# Patient Record
Sex: Male | Born: 1950 | Race: Black or African American | Hispanic: No | Marital: Married | Smoking: Current every day smoker
Health system: Southern US, Community
[De-identification: ages and names within clinical notes are randomized; demographics above are authoritative.]

## PROBLEM LIST (undated history)

## (undated) DIAGNOSIS — Z9289 Personal history of other medical treatment: Secondary | ICD-10-CM

## (undated) DIAGNOSIS — I639 Cerebral infarction, unspecified: Secondary | ICD-10-CM

## (undated) DIAGNOSIS — J449 Chronic obstructive pulmonary disease, unspecified: Secondary | ICD-10-CM

## (undated) HISTORY — PX: NO PAST SURGERIES: SHX2092

## (undated) HISTORY — DX: Chronic obstructive pulmonary disease, unspecified: J44.9

---

## 2000-04-16 ENCOUNTER — Ambulatory Visit (HOSPITAL_COMMUNITY): Admission: RE | Admit: 2000-04-16 | Discharge: 2000-04-16 | Payer: Self-pay | Admitting: Family Medicine

## 2000-04-16 ENCOUNTER — Encounter: Payer: Self-pay | Admitting: Family Medicine

## 2001-06-04 ENCOUNTER — Inpatient Hospital Stay (HOSPITAL_COMMUNITY): Admission: EM | Admit: 2001-06-04 | Discharge: 2001-06-05 | Payer: Self-pay | Admitting: Emergency Medicine

## 2001-06-04 ENCOUNTER — Encounter: Payer: Self-pay | Admitting: Emergency Medicine

## 2001-06-15 ENCOUNTER — Ambulatory Visit (HOSPITAL_COMMUNITY): Admission: RE | Admit: 2001-06-15 | Discharge: 2001-06-15 | Payer: Self-pay | Admitting: Cardiology

## 2001-06-15 ENCOUNTER — Encounter: Payer: Self-pay | Admitting: Cardiology

## 2001-06-16 ENCOUNTER — Encounter: Admission: RE | Admit: 2001-06-16 | Discharge: 2001-06-16 | Payer: Self-pay | Admitting: Family Medicine

## 2001-11-06 ENCOUNTER — Emergency Department (HOSPITAL_COMMUNITY): Admission: EM | Admit: 2001-11-06 | Discharge: 2001-11-06 | Payer: Self-pay | Admitting: Emergency Medicine

## 2010-08-06 ENCOUNTER — Inpatient Hospital Stay (INDEPENDENT_AMBULATORY_CARE_PROVIDER_SITE_OTHER)
Admission: RE | Admit: 2010-08-06 | Discharge: 2010-08-06 | Disposition: A | Discharge status: Home / Self Care | Payer: Self-pay | Source: Ambulatory Visit | Attending: Family Medicine | Admitting: Family Medicine

## 2010-08-06 DIAGNOSIS — L03019 Cellulitis of unspecified finger: Secondary | ICD-10-CM

## 2010-08-09 LAB — CULTURE, ROUTINE-ABSCESS

## 2012-01-18 ENCOUNTER — Emergency Department (HOSPITAL_COMMUNITY): Payer: PRIVATE HEALTH INSURANCE

## 2012-01-18 ENCOUNTER — Encounter (HOSPITAL_COMMUNITY): Payer: Self-pay | Admitting: Emergency Medicine

## 2012-01-18 ENCOUNTER — Inpatient Hospital Stay (HOSPITAL_COMMUNITY)
Admission: EM | Admit: 2012-01-18 | Discharge: 2012-01-20 | DRG: 065 | Disposition: A | Discharge status: Home / Self Care | Payer: PRIVATE HEALTH INSURANCE | Attending: Internal Medicine | Admitting: Internal Medicine

## 2012-01-18 DIAGNOSIS — F17200 Nicotine dependence, unspecified, uncomplicated: Secondary | ICD-10-CM | POA: Diagnosis present

## 2012-01-18 DIAGNOSIS — Z720 Tobacco use: Secondary | ICD-10-CM

## 2012-01-18 DIAGNOSIS — I6350 Cerebral infarction due to unspecified occlusion or stenosis of unspecified cerebral artery: Principal | ICD-10-CM | POA: Diagnosis present

## 2012-01-18 DIAGNOSIS — E785 Hyperlipidemia, unspecified: Secondary | ICD-10-CM

## 2012-01-18 DIAGNOSIS — G8190 Hemiplegia, unspecified affecting unspecified side: Secondary | ICD-10-CM | POA: Diagnosis present

## 2012-01-18 DIAGNOSIS — E119 Type 2 diabetes mellitus without complications: Secondary | ICD-10-CM | POA: Diagnosis present

## 2012-01-18 DIAGNOSIS — I639 Cerebral infarction, unspecified: Secondary | ICD-10-CM | POA: Diagnosis present

## 2012-01-18 DIAGNOSIS — R279 Unspecified lack of coordination: Secondary | ICD-10-CM | POA: Diagnosis present

## 2012-01-18 LAB — CBC
MCH: 30.3 pg (ref 26.0–34.0)
MCV: 87.7 fL (ref 78.0–100.0)
Platelets: 221 10*3/uL (ref 150–400)
RBC: 4.22 MIL/uL (ref 4.22–5.81)
RDW: 13.7 % (ref 11.5–15.5)
WBC: 8.1 10*3/uL (ref 4.0–10.5)

## 2012-01-18 LAB — POCT I-STAT, CHEM 8
BUN: 16 mg/dL (ref 6–23)
Chloride: 107 mEq/L (ref 96–112)
Creatinine, Ser: 1.2 mg/dL (ref 0.50–1.35)
Potassium: 3.6 mEq/L (ref 3.5–5.1)
Sodium: 144 mEq/L (ref 135–145)
TCO2: 24 mmol/L (ref 0–100)

## 2012-01-18 LAB — DIFFERENTIAL
Basophils Absolute: 0 10*3/uL (ref 0.0–0.1)
Eosinophils Absolute: 0.3 10*3/uL (ref 0.0–0.7)
Eosinophils Relative: 4 % (ref 0–5)
Lymphs Abs: 2.9 10*3/uL (ref 0.7–4.0)
Neutrophils Relative %: 51 % (ref 43–77)

## 2012-01-18 LAB — GLUCOSE, CAPILLARY: Glucose-Capillary: 91 mg/dL (ref 70–99)

## 2012-01-18 LAB — COMPREHENSIVE METABOLIC PANEL
ALT: 11 U/L (ref 0–53)
AST: 17 U/L (ref 0–37)
Albumin: 4 g/dL (ref 3.5–5.2)
Alkaline Phosphatase: 72 U/L (ref 39–117)
Calcium: 9.9 mg/dL (ref 8.4–10.5)
Potassium: 3.7 mEq/L (ref 3.5–5.1)
Sodium: 139 mEq/L (ref 135–145)
Total Protein: 6.8 g/dL (ref 6.0–8.3)

## 2012-01-18 LAB — PROTIME-INR
INR: 0.92 (ref 0.00–1.49)
Prothrombin Time: 12.3 seconds (ref 11.6–15.2)

## 2012-01-18 NOTE — ED Notes (Signed)
Pt noted onset of inability to keep balance, like room was spinning, onset this a.m.  Also complains of "band around by head" for a while. Defines while as a couple months. Has not seen anyone about tension in head, just came today d/t inability to keep balance.

## 2012-01-18 NOTE — ED Provider Notes (Signed)
History     CSN: [id number]  Arrival date & time 01/18/12  1619   First MD Initiated Contact with Patient 01/18/12 1639      Chief Complaint  Patient presents with  . Dizziness    (Consider location/radiation/quality/duration/timing/severity/associated sxs/prior treatment) HPI  61 y.o. male in no acute distress complaining of difficulty ambulating with ataxia for the last 24 hours. Patient also reports a sensation of a feeling of a band around his head for several months.This is not described as a headache or painful. Distal sensation is not exacerbated by Valsalva.  Denies vertiginous sensation. Denies fever, chest pain, shortness of breath, dysphagia, dysarthria, focal weakness.  RF DM, 1/2 PPD tobacco PCP Harwani   Past Medical History  Diagnosis Date  . Diabetes mellitus     History reviewed. No pertinent past surgical history.  No family history on file.  History  Substance Use Topics  . Smoking status: Current Every Day Smoker -- 0.2 packs/day    Types: Cigarettes  . Smokeless tobacco: Never Used  . Alcohol Use: No      Review of Systems  Constitutional: Negative for fever.  Respiratory: Negative for shortness of breath.   Cardiovascular: Negative for chest pain.  Gastrointestinal: Negative for nausea, vomiting, abdominal pain and diarrhea.  Neurological: Negative for tremors, syncope, speech difficulty, weakness, numbness and headaches.       Ataxia  All other systems reviewed and are negative.    Allergies  Review of patient's allergies indicates not on file.  Home Medications  No current outpatient prescriptions on file.  BP 145/72  Pulse 81  Temp 98.8 F (37.1 C) (Oral)  Resp 18  Ht 6\' 3"  (1.905 m)  Wt 185 lb (83.915 kg)  BMI 23.12 kg/m2  SpO2 99%  Physical Exam  Nursing note and vitals reviewed. Constitutional: He is oriented to person, place, and time. He appears well-developed and well-nourished. No distress.  HENT:  Head:  Normocephalic and atraumatic.  Mouth/Throat: Oropharynx is clear and moist.  Eyes: Conjunctivae normal and EOM are normal. Pupils are equal, round, and reactive to light.  Neck: Normal range of motion.       No carotid bruits appreciated.  Cardiovascular: Normal rate, regular rhythm and intact distal pulses.   Pulmonary/Chest: Effort normal and breath sounds normal. No stridor. No respiratory distress. He has no wheezes. He has no rales. He exhibits no tenderness.  Abdominal: Soft. Bowel sounds are normal. He exhibits no distension and no mass. There is no tenderness. There is no rebound and no guarding.  Musculoskeletal: Normal range of motion.  Neurological: He is alert and oriented to person, place, and time.       Cranial nerves III through XII intact, strength 5 out of 5x4 extremities, negative pronator drift, finger to nose and heel-to-shin coordinated, sensation intact to pinprick and light touch, gait is coordinated and Romberg is negative.   Psychiatric: He has a normal mood and affect.    ED Course  Procedures (including critical care time)  Labs Reviewed  CBC - Abnormal; Notable for the following:    Hemoglobin 12.8 (*)     HCT 37.0 (*)     All other components within normal limits  COMPREHENSIVE METABOLIC PANEL - Abnormal; Notable for the following:    GFR calc non Af Amer 72 (*)     GFR calc Af Amer 83 (*)     All other components within normal limits  GLUCOSE, CAPILLARY  PROTIME-INR  APTT  DIFFERENTIAL  TROPONIN I  GLUCOSE, CAPILLARY  POCT I-STAT, CHEM 8   Ct Head Wo Contrast  01/18/2012  *RADIOLOGY REPORT*  Clinical Data: Sudden onset of imbalance this morning.  Band-like sensation around the head for several months.  CT HEAD WITHOUT CONTRAST  Technique:  Contiguous axial images were obtained from the base of the skull through the vertex without contrast.  Comparison: Brain MR report dated 04/16/2000.  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  The previous  brain MR report described old right cerebellar lacunar infarcts. Small, old left cerebellar infarct or focally prominent subarachnoid space.  Normal size and position of the ventricles. Minimal patchy white matter low density in both cerebral hemispheres.  No intracranial hemorrhage, mass lesion or CT evidence of acute infarction. Bilateral internal carotid artery atheromatous calcification.  Mild sphenoid sinus mucosal thickening.  Unremarkable bones.  Multiple small areas of calcific density in the skin and subcutaneous fat of the forehead bilaterally.  IMPRESSION:  1.  No acute abnormality. 2.  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 3.  Minimal chronic small vessel white matter ischemic changes in both cerebral hemispheres.   Original Report Authenticated By: Darrol Angel, M.D.     Date: 01/18/2012  Rate: 73  Rhythm: normal sinus rhythm  QRS Axis: normal  Intervals: normal  ST/T Wave abnormalities: normal  Conduction Disutrbances:none  Narrative Interpretation: LVH  Old EKG Reviewed: unchanged  1. CVA (cerebral infarction)       MDM  Neurological exam is normal with coordinated gait and no cerebellar signs. However patient reports subjective ataxia.  Patient is at relatively high risk for stroke based on PMH of diabetes and hyperlipidemia. Possible the patient has undiagnosed hypertension as BP is mildly elevated and ECG shows LVH.   CT show remote right infarct and possible left cerebellar infarct. MRI ordered.  MRI delayed because tech must be called in.  Signed out to PA Eagle at shift change.          Wynetta Emery, PA-C 01/19/12 [phone]

## 2012-01-18 NOTE — ED Notes (Signed)
Waiting for MRI tech to come in.  Patient notified of delay.

## 2012-01-18 NOTE — ED Notes (Signed)
Pt returned from CT °

## 2012-01-18 NOTE — ED Notes (Signed)
Patient transported to CT 

## 2012-01-18 NOTE — ED Notes (Signed)
Pt returned from MRI °

## 2012-01-19 ENCOUNTER — Inpatient Hospital Stay (HOSPITAL_COMMUNITY): Payer: PRIVATE HEALTH INSURANCE

## 2012-01-19 DIAGNOSIS — R6889 Other general symptoms and signs: Secondary | ICD-10-CM

## 2012-01-19 DIAGNOSIS — I639 Cerebral infarction, unspecified: Secondary | ICD-10-CM | POA: Diagnosis present

## 2012-01-19 DIAGNOSIS — I6350 Cerebral infarction due to unspecified occlusion or stenosis of unspecified cerebral artery: Principal | ICD-10-CM

## 2012-01-19 MED ORDER — ATORVASTATIN CALCIUM 40 MG PO TABS
40.0000 mg | ORAL_TABLET | Freq: Every day | ORAL | Status: DC
  Administered 2012-01-20 (×2): 40 mg via ORAL
  Filled 2012-01-19 (×3): qty 1

## 2012-01-19 MED ORDER — INSULIN ASPART 100 UNIT/ML ~~LOC~~ SOLN: 0.0000 [IU] | Freq: Three times a day (TID) | SUBCUTANEOUS | Status: DC

## 2012-01-19 MED ORDER — SENNOSIDES-DOCUSATE SODIUM 8.6-50 MG PO TABS: 1.0000 | ORAL_TABLET | Freq: Every evening | ORAL | Status: DC | PRN

## 2012-01-19 MED ORDER — METFORMIN HCL 500 MG PO TABS
500.0000 mg | ORAL_TABLET | Freq: Two times a day (BID) | ORAL | Status: DC
  Administered 2012-01-19: 500 mg via ORAL
  Filled 2012-01-19 (×3): qty 1

## 2012-01-19 MED ORDER — GADOBENATE DIMEGLUMINE 529 MG/ML IV SOLN
18.0000 mL | Freq: Once | INTRAVENOUS | Status: AC | PRN
  Administered 2012-01-19: 18 mL via INTRAVENOUS

## 2012-01-19 MED ORDER — ASPIRIN 325 MG PO TABS
650.0000 mg | ORAL_TABLET | Freq: Once | ORAL | Status: AC
  Administered 2012-01-19: 650 mg via ORAL
  Filled 2012-01-19: qty 2

## 2012-01-19 MED ORDER — ENOXAPARIN SODIUM 40 MG/0.4ML ~~LOC~~ SOLN
40.0000 mg | Freq: Every day | SUBCUTANEOUS | Status: DC
  Administered 2012-01-19: 40 mg via SUBCUTANEOUS
  Filled 2012-01-19 (×2): qty 0.4

## 2012-01-19 MED ORDER — ASPIRIN 81 MG PO CHEW
CHEWABLE_TABLET | ORAL | Status: AC
  Administered 2012-01-19: 648 mg
  Filled 2012-01-19: qty 8

## 2012-01-19 MED ORDER — INSULIN ASPART 100 UNIT/ML ~~LOC~~ SOLN: 0.0000 [IU] | Freq: Every day | SUBCUTANEOUS | Status: DC

## 2012-01-19 MED ORDER — ENOXAPARIN SODIUM 40 MG/0.4ML ~~LOC~~ SOLN
40.0000 mg | SUBCUTANEOUS | Status: DC
  Filled 2012-01-19 (×2): qty 0.4

## 2012-01-19 MED ORDER — SODIUM CHLORIDE 0.9 % IV SOLN
INTRAVENOUS | Status: AC
  Administered 2012-01-19: 13:00:00 via INTRAVENOUS

## 2012-01-19 NOTE — Consult Note (Signed)
[facility] CONSULT NOTE     Reason for Consult: Acute right hemisphere watershed stroke.  CC: Left sided weakness and imbalance.    HPI:    Elijah Sandoval is an 61 y.o. male who presented to the [facility] ED with acute left sided weakness and gait instability. He also experienced a sensation of room-spinning, per ED, but on my interview denies this. The patient states that he is on "pills for diabetes" and takes ASA sporadically.   CT at [facility] revealed an old right cerebellar hemisphere infarct and possible old left cerebellar hemisphere infarct. Minimal chronic small vessel white matter ischemic changes in both cerebral hemispheres were noted. A cystic dilatation of the anterior body of the left lateral ventricle was also present.   An MRI brain was subsequently obtained, revealing an acute infarction in a watershed distribution within the right posterior frontal and parietal region. Mild swelling but no hemorrhage or mass effect/shift was noted. 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   EKG negative for atrial fibrillation or flutter.   Past Medical History  Diagnosis Date  . Diabetes mellitus     History reviewed. No pertinent past surgical history.  No family history on file.  Social History:  reports that he has been smoking Cigarettes.  He has been smoking about .25 packs per day. He has never used smokeless tobacco. He reports that he does not drink alcohol or use illicit drugs.  No Known Allergies  Medications:    Home meds: "pills for diabetes" and occasional ASA.    Review of Systems - Denies fever, cough, chest pain, limb pain, abdominal pain, confusion or visual disturbance.   Blood pressure 143/71, pulse 76, temperature 98.8 F (37.1 C), temperature source Oral, resp. rate 17,  height 6\' 3"  (1.905 m), weight 83.915 kg (185 lb), SpO2 99.00%.   Neurologic Examination:   Mental Status: Alert and fully oriented. Pleasant and cooperative. Mild halting quality to speech. No dysarthria. Comprehension, repetition and naming intact.  Cranial Nerves: II- PERRL, visual fields intact.  II/IV/VI- EOMI without nystagmus.  V - intact to temperature bilaterally.  VII- Symmetric without droop or delayed motor response.  VIII-Intact to conversation.  IX/X-No hypophonia or hoarseness.  XI-Shoulder shrug 5/5 bilaterally.  XII-Tongue midline.  Motor: 4+/5 left deltoid, biceps, triceps and grip. 4/5 left hip flexion and knee extension. Otherwise 5/5 throughout. Tone and bulk are normal.  Sensory: Intact to FT and temperature bilaterally in upper and lower extremities, without extinction.  DTR's: Normoactive x 4. Toes downgoing.  Cerebellar: Subtle slowing on the left with FNF.  Gait: Able to stand with own power, stance normal, negative Romberg. Favors right leg when marching in place, with decreased excursion of left leg.   No results found for this basename: cbc, bmp, coags, chol, tri, ldl, hga1c    Results for orders placed during the hospital encounter of 01/18/12 (from the past 48 hour(s))  GLUCOSE, CAPILLARY     Status: Normal   Collection Time   01/18/12  4:28 PM      Component Value Range Comment   Glucose-Capillary 91  70 - 99 mg/dL   GLUCOSE, CAPILLARY     Status: Normal   Collection Time   01/18/12  5:12 PM      Component Value Range Comment   Glucose-Capillary 88  70 - 99 mg/dL    Comment 1 Notify RN     PROTIME-INR     Status: Normal   Collection Time   01/18/12  5:22 PM      Component Value Range Comment   Prothrombin Time 12.3  11.6 - 15.2 seconds    INR 0.92  0.00 - 1.49   APTT     Status: Normal   Collection Time   01/18/12  5:22 PM      Component Value Range Comment   aPTT 27  24 - 37 seconds   CBC     Status: Abnormal   Collection Time   01/18/12   5:22 PM      Component Value Range Comment   WBC 8.1  4.0 - 10.5 K/uL    RBC 4.22  4.22 - 5.81 MIL/uL    Hemoglobin 12.8 (*) 13.0 - 17.0 g/dL    HCT 16.1 (*) 09.6 - 52.0 %    MCV 87.7  78.0 - 100.0 fL    MCH 30.3  26.0 - 34.0 pg    MCHC 34.6  30.0 - 36.0 g/dL    RDW 04.5  40.9 - 81.1 %    Platelets 221  150 - 400 K/uL   DIFFERENTIAL     Status: Normal   Collection Time   01/18/12  5:22 PM      Component Value Range Comment   Neutrophils Relative 51  43 - 77 %    Neutro Abs 4.1  1.7 - 7.7 K/uL    Lymphocytes Relative 36  12 - 46 %    Lymphs Abs 2.9  0.7 - 4.0 K/uL    Monocytes Relative 9  3 - 12 %    Monocytes Absolute 0.7  0.1 - 1.0 K/uL    Eosinophils Relative 4  0 - 5 %    Eosinophils Absolute 0.3  0.0 - 0.7 K/uL    Basophils Relative 0  0 - 1 %    Basophils Absolute 0.0  0.0 - 0.1 K/uL   COMPREHENSIVE METABOLIC PANEL     Status: Abnormal   Collection Time   01/18/12  5:22 PM      Component Value Range Comment   Sodium 139  135 - 145 mEq/L    Potassium 3.7  3.5 - 5.1 mEq/L    Chloride 103  96 - 112 mEq/L    CO2 26  19 - 32 mEq/L    Glucose, Bld 77  70 - 99 mg/dL    BUN 15  6 - 23 mg/dL    Creatinine, Ser 9.14  0.50 - 1.35 mg/dL    Calcium 9.9  8.4 - 78.2 mg/dL    Total Protein 6.8  6.0 - 8.3 g/dL    Albumin 4.0  3.5 - 5.2 g/dL    AST 17  0 - 37 U/L    ALT 11  0 - 53 U/L    Alkaline Phosphatase 72  39 - 117 U/L    Total Bilirubin 0.3  0.3 - 1.2 mg/dL    GFR calc non Af Amer 72 (*) >90 mL/min    GFR calc Af Amer 83 (*) >90 mL/min   TROPONIN I     Status: Normal   Collection Time   01/18/12  5:23 PM      Component Value Range Comment   Troponin I <0.30  <  0.30 ng/mL   POCT I-STAT, CHEM 8     Status: Normal   Collection Time   01/18/12  6:02 PM      Component Value Range Comment   Sodium 144  135 - 145 mEq/L    Potassium 3.6  3.5 - 5.1 mEq/L    Chloride 107  96 - 112 mEq/L    BUN 16  6 - 23 mg/dL    Creatinine, Ser 1.61  0.50 - 1.35 mg/dL    Glucose, Bld 76  70 -  99 mg/dL    Calcium, Ion 0.96  0.45 - 1.30 mmol/L    TCO2 24  0 - 100 mmol/L    Hemoglobin 13.6  13.0 - 17.0 g/dL    HCT 40.9  81.1 - 91.4 %     Ct Head Wo Contrast  01/18/2012  *RADIOLOGY REPORT*  Clinical Data: Sudden onset of imbalance this morning.  Band-like sensation around the head for several months.  CT HEAD WITHOUT CONTRAST  Technique:  Contiguous axial images were obtained from the base of the skull through the vertex without contrast.  Comparison: Brain MR report dated 04/16/2000.  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  The previous brain MR report described old right cerebellar lacunar infarcts. Small, old left cerebellar infarct or focally prominent subarachnoid space.  Normal size and position of the ventricles. Minimal patchy white matter low density in both cerebral hemispheres.  No intracranial hemorrhage, mass lesion or CT evidence of acute infarction. Bilateral internal carotid artery atheromatous calcification.  Mild sphenoid sinus mucosal thickening.  Unremarkable bones.  Multiple small areas of calcific density in the skin and subcutaneous fat of the forehead bilaterally.  IMPRESSION:  1.  No acute abnormality. 2.  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 3.  Minimal chronic small vessel white matter ischemic changes in both cerebral hemispheres.   Original Report Authenticated By: Darrol Angel, M.D.    Mr Brain Wo Contrast  01/18/2012  *RADIOLOGY REPORT*  Clinical Data: Dizziness.  Headache.  Pressure.  MRI HEAD WITHOUT CONTRAST  Technique:  Multiplanar, multiecho pulse sequences of the brain and surrounding structures were obtained according to standard protocol without intravenous contrast.  Comparison: Head CT same day  Findings: Diffusion imaging shows acute infarction in a watershed territory affecting the right hemisphere in the posterior frontal to parietal region.  The areas of infarction show mild swelling  but no hemorrhage or mass effect/shift.  There are old infarctions within the cerebellum, right more than left.  No focal brain stem insult.  The cerebral hemispheres otherwise show an old infarction affecting the corpus callosum and pericallosal white matter to the left of midline.  There are some chronic small vessel changes in the hemispheric deep white matter. No mass lesion, hemorrhage or obstructive hydrocephalus.  No pituitary mass.  Sinuses, middle ears and mastoids are clear except for mucosal thickening affecting the sphenoid sinus.  IMPRESSION: Acute infarction in a watershed distribution of the right posterior frontal and parietal region.  Mild swelling but no hemorrhage or mass effect/shift.  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   Original Report Authenticated By: Thomasenia Sales, M.D.      Assessment/Plan:   Assessment: 1. Acute right ACA/MCA watershed infarction. The appearance is most consistent with decreased pressure/flow via the right ICA, possibly due to atherosclerotic carotid stenosis or partial thrombotic/embolic occlusion. Cardiac source is on the DDx.  2. Small chronic cerebellar infarctions.  3. Diabetes mellitus.    Recommendations:  1. Load with 650 mg ASA PO after bedside swallow evaluation.  2. MRA of head and neck, TTE, carotid ultrasound.  3. Permissive HTN x 24 hours.  4. Start atorvastatin 40 mg po qd. Obtain CK level and LFTs.  5. FSBS qac and hs with sliding scale insulin. 6.  PT/OT/Speech.   Electronically signed: Dr. Caryl Pina   01/19/2012, 5:41 AM

## 2012-01-19 NOTE — ED Provider Notes (Signed)
Patient with new onset with difficulty ambulating which began yesterday.  No acute abnormalities here on neuro exam.  Patient with infarct on mri but no code stroke due to low stroke score and timing of presentation 24 hours.   Hilario Quarry, MD 01/19/12 [phone]

## 2012-01-19 NOTE — Progress Notes (Signed)
SLP Cancellation Note  Patient Details Name: Michial Disney MRN: [id number] DOB: 10/26/1950   ST received order for SLE and will be addressed on 01/20/12.        Moreen Fowler MS, CCC-SLP [phone]  [facility] 01/19/2012, 4:36 PM

## 2012-01-19 NOTE — Progress Notes (Signed)
Stroke Team Progress Note  HISTORY Elijah Sandoval is an 61 y.o. male who presented to the [facility] ED with acute left sided weakness and gait instability. He also experienced a sensation of room-spinning, per ED, but on my interview denies this. The patient states that he is on "pills for diabetes" and takes ASA sporadically.  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 Minimal chronic small vessel white matter ischemic changes in both cerebral hemispheres were noted. A cystic dilatation of the anterior body of the left lateral ventricle was also present.  An MRI brain was subsequently obtained, revealing an acute infarction in a watershed distribution within the right posterior frontal and parietal region. Mild swelling but no hemorrhage or mass effect/shift was noted. 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  EKG negative for atrial fibrillation or flutter.    SUBJECTIVE His wife and family is at the bedside. Overall he feels his condition is unchanged. The patient reports dizziness, some headache and left leg weakness.  OBJECTIVE Most recent Vital Signs: Temp: 98 F (36.7 C) (10/06 0820) Temp src: Oral (10/06 0820) BP: 135/68 mmHg (10/06 0930) Pulse Rate: 92  (10/06 0900) Respiratory Rate: 17 O2 Saturation: 100%  CBG (last 3)  Basename 01/18/12 1712 01/18/12 1628  GLUCAP 88 91   Intake/Output from previous day:    IV Fluid Intake:    Medications    . sodium chloride   Intravenous STAT  . aspirin      . aspirin  650 mg Oral Once  . atorvastatin  40 mg Oral q1800  . insulin aspart  0-15 Units Subcutaneous TID WC  . insulin aspart  0-5 Units Subcutaneous QHS  . DISCONTD: metFORMIN  500 mg Oral BID WC  PRN senna-docusate  Diet:  Carb Control thin  liquids Activity:  Up with assistance DVT Prophylaxis:  lovenox  Significant Diagnostic Studies: CBC    Component Value Date/Time   WBC 8.1 01/18/2012 1722   RBC 4.22 01/18/2012 1722   HGB 13.6 01/18/2012 1802   HCT 40.0 01/18/2012 1802   PLT 221 01/18/2012 1722   MCV 87.7 01/18/2012 1722   MCH 30.3 01/18/2012 1722   MCHC 34.6 01/18/2012 1722   RDW 13.7 01/18/2012 1722   LYMPHSABS 2.9 01/18/2012 1722   MONOABS 0.7 01/18/2012 1722   EOSABS 0.3 01/18/2012 1722   BASOSABS 0.0 01/18/2012 1722   CMP    Component Value Date/Time   NA 144 01/18/2012 1802   K 3.6 01/18/2012 1802   CL 107 01/18/2012 1802   CO2 26 01/18/2012 1722   GLUCOSE 76 01/18/2012 1802   BUN 16 01/18/2012 1802   CREATININE 1.20 01/18/2012 1802   CALCIUM 9.9 01/18/2012 1722   PROT 6.8 01/18/2012 1722   ALBUMIN 4.0 01/18/2012 1722   AST 17 01/18/2012 1722   ALT 11 01/18/2012 1722   ALKPHOS 72 01/18/2012 1722   BILITOT 0.3 01/18/2012 1722   GFRNONAA 72* 01/18/2012 1722   GFRAA 83* 01/18/2012 1722   COAGS Lab Results  Component Value Date   INR 0.92 01/18/2012   Lipid Panel No results found for this basename: chol, trig, hdl, cholhdl, vldl, ldlcalc   HgbA1C  No results found for this basename: HGBA1C   Urine Drug Screen  No results found for this basename: labopia,  cocainscrnur, labbenz, amphetmu, thcu, labbarb    Alcohol Level No results found for this basename: eth     Results for orders placed during the hospital encounter of 01/18/12 (from the past 24 hour(s))  GLUCOSE, CAPILLARY     Status: Normal   Collection Time   01/18/12  4:28 PM      Component Value Range   Glucose-Capillary 91  70 - 99 mg/dL  GLUCOSE, CAPILLARY     Status: Normal   Collection Time   01/18/12  5:12 PM      Component Value Range   Glucose-Capillary 88  70 - 99 mg/dL   Comment 1 Notify RN    PROTIME-INR     Status: Normal   Collection Time   01/18/12  5:22 PM      Component Value Range   Prothrombin Time 12.3  11.6 - 15.2 seconds   INR  0.92  0.00 - 1.49  APTT     Status: Normal   Collection Time   01/18/12  5:22 PM      Component Value Range   aPTT 27  24 - 37 seconds  CBC     Status: Abnormal   Collection Time   01/18/12  5:22 PM      Component Value Range   WBC 8.1  4.0 - 10.5 K/uL   RBC 4.22  4.22 - 5.81 MIL/uL   Hemoglobin 12.8 (*) 13.0 - 17.0 g/dL   HCT 45.4 (*) 09.8 - 11.9 %   MCV 87.7  78.0 - 100.0 fL   MCH 30.3  26.0 - 34.0 pg   MCHC 34.6  30.0 - 36.0 g/dL   RDW 14.7  82.9 - 56.2 %   Platelets 221  150 - 400 K/uL  DIFFERENTIAL     Status: Normal   Collection Time   01/18/12  5:22 PM      Component Value Range   Neutrophils Relative 51  43 - 77 %   Neutro Abs 4.1  1.7 - 7.7 K/uL   Lymphocytes Relative 36  12 - 46 %   Lymphs Abs 2.9  0.7 - 4.0 K/uL   Monocytes Relative 9  3 - 12 %   Monocytes Absolute 0.7  0.1 - 1.0 K/uL   Eosinophils Relative 4  0 - 5 %   Eosinophils Absolute 0.3  0.0 - 0.7 K/uL   Basophils Relative 0  0 - 1 %   Basophils Absolute 0.0  0.0 - 0.1 K/uL  COMPREHENSIVE METABOLIC PANEL     Status: Abnormal   Collection Time   01/18/12  5:22 PM      Component Value Range   Sodium 139  135 - 145 mEq/L   Potassium 3.7  3.5 - 5.1 mEq/L   Chloride 103  96 - 112 mEq/L   CO2 26  19 - 32 mEq/L   Glucose, Bld 77  70 - 99 mg/dL   BUN 15  6 - 23 mg/dL   Creatinine, Ser 1.30  0.50 - 1.35 mg/dL   Calcium 9.9  8.4 - 86.5 mg/dL   Total Protein 6.8  6.0 - 8.3 g/dL   Albumin 4.0  3.5 - 5.2 g/dL   AST 17  0 - 37 U/L   ALT 11  0 - 53 U/L   Alkaline Phosphatase 72  39 - 117 U/L   Total Bilirubin 0.3  0.3 - 1.2 mg/dL   GFR calc non Af Amer 72 (*) >90 mL/min  GFR calc Af Amer 83 (*) >90 mL/min  TROPONIN I     Status: Normal   Collection Time   01/18/12  5:23 PM      Component Value Range   Troponin I <0.30  <0.30 ng/mL  POCT I-STAT, CHEM 8     Status: Normal   Collection Time   01/18/12  6:02 PM      Component Value Range   Sodium 144  135 - 145 mEq/L   Potassium 3.6  3.5 - 5.1 mEq/L    Chloride 107  96 - 112 mEq/L   BUN 16  6 - 23 mg/dL   Creatinine, Ser 1.61  0.50 - 1.35 mg/dL   Glucose, Bld 76  70 - 99 mg/dL   Calcium, Ion 0.96  0.45 - 1.30 mmol/L   TCO2 24  0 - 100 mmol/L   Hemoglobin 13.6  13.0 - 17.0 g/dL   HCT 40.9  81.1 - 91.4 %    Ct Head Wo Contrast  01/18/2012  *RADIOLOGY REPORT*  Clinical Data: Sudden onset of imbalance this morning.  Band-like sensation around the head for several months.  CT HEAD WITHOUT CONTRAST  Technique:  Contiguous axial images were obtained from the base of the skull through the vertex without contrast.  Comparison: Brain MR report dated 04/16/2000.  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  The previous brain MR report described old right cerebellar lacunar infarcts. Small, old left cerebellar infarct or focally prominent subarachnoid space.  Normal size and position of the ventricles. Minimal patchy white matter low density in both cerebral hemispheres.  No intracranial hemorrhage, mass lesion or CT evidence of acute infarction. Bilateral internal carotid artery atheromatous calcification.  Mild sphenoid sinus mucosal thickening.  Unremarkable bones.  Multiple small areas of calcific density in the skin and subcutaneous fat of the forehead bilaterally.  IMPRESSION:  1.  No acute abnormality. 2.  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 3.  Minimal chronic small vessel white matter ischemic changes in both cerebral hemispheres.   Original Report Authenticated By: Darrol Angel, M.D.    Mr Brain Wo Contrast  01/18/2012  *RADIOLOGY REPORT*  Clinical Data: Dizziness.  Headache.  Pressure.  MRI HEAD WITHOUT CONTRAST  Technique:  Multiplanar, multiecho pulse sequences of the brain and surrounding structures were obtained according to standard protocol without intravenous contrast.  Comparison: Head CT same day  Findings: Diffusion imaging shows acute infarction in a watershed territory  affecting the right hemisphere in the posterior frontal to parietal region.  The areas of infarction show mild swelling but no hemorrhage or mass effect/shift.  There are old infarctions within the cerebellum, right more than left.  No focal brain stem insult.  The cerebral hemispheres otherwise show an old infarction affecting the corpus callosum and pericallosal white matter to the left of midline.  There are some chronic small vessel changes in the hemispheric deep white matter. No mass lesion, hemorrhage or obstructive hydrocephalus.  No pituitary mass.  Sinuses, middle ears and mastoids are clear except for mucosal thickening affecting the sphenoid sinus.  IMPRESSION: Acute infarction in a watershed distribution of the right posterior frontal and parietal region.  Mild swelling but no hemorrhage or mass effect/shift.  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   Original Report Authenticated By: Thomasenia Sales, M.D.     CT of the brain   IMPRESSION:  1. No acute  abnormality.  2. 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  3. Minimal chronic small vessel white matter ischemic changes in  both cerebral hemispheres.   CT angio  Not ordered  MRI of the brain    IMPRESSION:  Acute infarction in a watershed distribution of the right posterior  frontal and parietal region. Mild swelling but no hemorrhage or  mass effect/shift.  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 Old infarction of the corpus callosum  and pericallosal white matter.  MRA of the brain  Pending  2D Echocardiogram  Pending  Carotid Doppler  Pending  CXR  Not ordered  EKG   SINUS RHYTHM ~ normal P axis, V-rate 50- 99  Physical Exam   The patient is alert and cooperative.  Neurologic exam reveals full extraocular movements, speech is normal. Visual fields are full.  Motor testing reveals good strength of all four  extremities, with the exception that the left leg is slightly weak with elevation.  The patient has good finger-nose-finger and heel-to-shin bilaterally. Gait was not tested.  Deep tendon reflexes are symmetric and normal. Toes are down going bilaterally.    ASSESSMENT Elijah Sandoval is a 61 y.o. male with a right brain stroke. Not on antiplatelet agents prior to admit.   Stroke risk factors:  diabetes mellitus  Hospital day # 1  TREATMENT/PLAN Continue aspirin 325 mg orally every day for secondary stroke prevention.   [name] is a 60 year old gentleman with a history of diabetes on metformin. The patient comes in with a two-day history of some issues with left leg weakness, and some reports of dizziness and headache. The patient was not on antiplatelet agents prior to admission. MRI the brain is shown evidence of a right brain stroke, possibly watershed infarct. The patient will be coming in for a further stroke workup.  -MRI angiogram of the head -MRI angiogram of the neck -2-D echocardiogram -Carotid Doppler study -Physical and occupational therapy evaluation -Aspirin therapy -Follow patient's clinical course -Urine drug screen -Fasting lipid profile   Lesly Dukes

## 2012-01-19 NOTE — Progress Notes (Signed)
PT Cancellation Note  Patient Details Name: Elijah Sandoval MRN: [id number] DOB: [birth year]   Cancelled Treatment:    Reason Eval/Treat Not Completed: Patient at procedure or test/unavailable (pt at MRI)   Milana Kidney 01/19/2012, 5:28 PM  01/19/2012 Milana Kidney DPT PAGER: [phone] OFFICE: [phone]

## 2012-01-19 NOTE — ED Provider Notes (Signed)
  Physical Exam  BP 127/65  Pulse 68  Temp 97.9 F (36.6 C) (Oral)  Resp 19  Ht 6\' 3"  (1.905 m)  Wt 185 lb (83.915 kg)  BMI 23.12 kg/m2  SpO2 98%  Physical Exam Reviewed results of MRI, which indicates patient has had an acute infarct.  I have contacted [facility] neurologist, who will evaluate the patient ED Course  Procedures  MDM Acute infarct in a watershed distribution of the right posterior frontal and parietal regions, with mild swelling.  No hemorrhage, mass effect, or shift  Hospital neurologist here, evaluating patient   Arman Filter, NP 01/19/12 0602

## 2012-01-19 NOTE — H&P (Signed)
[facility] History and Physical  Elijah Sandoval WGN:[id number] DOB: 29-Jul-1950 DOA: 01/18/2012  Referring physician: ER physician PCP: No primary provider on file.   Chief Complaint: left sided weakness  HPI:  61 year old male with history of diabetes who presented with sudden onset left sided weakness and dizziness but no loss of consciousness. Patient reported his symptoms currently resolved and although he still feels somewhat out of balance when he attempted to stand up it is significantly better since the time of arrival to ED. No complaints of chest pain, no shortness of breath, no palpitations. No abdominal pain, no nausea or vomiting, no reports of blood in stool or urine.  Assessment and Plan:  Principal Problem:  *Acute right ACA/MCA - identified on MRI brain; in the area of right posterior frontal and parietal region - appreciate neurology following - patient passed swallow evaluation - loaded with aspirin 650 mg - order placed for atorvastatin 40 mg Q HS - follow up labs associated with stroke order set  Active Problems: Diabetes - start sliding scale and continue metformin - check A1c  Code Status: Full Family Communication: Pt at bedside Disposition Plan: Admit for further evaluation; patient to be transferred to [facility] for further stroke management with [facility] primary and neurology consulting   Manson Passey, MD  [facility] Pager [phone]  If 7PM-7AM, please contact night-coverage [email or web address] Password [email or web address] 01/19/2012, 8:09 AM  Review of Systems:  Constitutional: Negative for fever, chills and malaise/fatigue. Negative for diaphoresis.  HENT: Negative for hearing loss, ear pain, nosebleeds, congestion, sore throat, neck pain, tinnitus and ear discharge.   Eyes: Negative for blurred vision, double vision, photophobia, pain, discharge and redness.  Respiratory: Negative for cough, hemoptysis, sputum production, shortness of breath, wheezing and stridor.     Cardiovascular: Negative for chest pain, palpitations, orthopnea, claudication and leg swelling.  Gastrointestinal: Negative for nausea, vomiting and abdominal pain. Negative for heartburn, constipation, blood in stool and melena.  Genitourinary: Negative for dysuria, urgency, frequency, hematuria and flank pain.  Musculoskeletal: Negative for myalgias, back pain, joint pain and falls.  Skin: Negative for itching and rash.  Neurological: per HPI.  Endo/Heme/Allergies: Negative for environmental allergies and polydipsia. Does not bruise/bleed easily.  Psychiatric/Behavioral: Negative for suicidal ideas. The patient is not nervous/anxious.      Past Medical History  Diagnosis Date  . Diabetes mellitus    History reviewed. No pertinent past surgical history. Social History:  reports that he has been smoking Cigarettes.  He has been smoking about .25 packs per day. He has never used smokeless tobacco. He reports that he does not drink alcohol or use illicit drugs.  No Known Allergies  Family History: heart disease in parents  Prior to Admission medications   Medication Sig Start Date End Date Taking? Authorizing Provider  metFORMIN (GLUCOPHAGE) 500 MG tablet Take 500 mg by mouth 2 (two) times daily with a meal.   Yes Historical Provider, MD   Physical Exam: Filed Vitals:   01/18/12 1900 01/18/12 2100 01/19/12 0546 01/19/12 0652  BP: 151/78 143/71 127/65   Pulse: 84 76 68   Temp:   97.9 F (36.6 C) 97.9 F (36.6 C)  TempSrc:   Oral   Resp: 17 17 19    Height:      Weight:      SpO2: 99% 99% 98%     Physical Exam  Constitutional: Appears well-developed and well-nourished. No distress.  HENT: Normocephalic. External right and left ear normal. Oropharynx is clear and  moist.  Eyes: Conjunctivae and EOM are normal. PERRLA, no scleral icterus.  Neck: Normal ROM. Neck supple. No JVD. No tracheal deviation. No thyromegaly.  CVS: RRR, S1/S2 +, no murmurs, no gallops, no carotid bruit.   Pulmonary: Effort and breath sounds normal, no stridor, rhonchi, wheezes, rales.  Abdominal: Soft. BS +,  no distension, tenderness, rebound or guarding.  Musculoskeletal: Normal range of motion. No edema and no tenderness.  Lymphadenopathy: No lymphadenopathy noted, cervical, inguinal. Neuro: Alert. Normal reflexes, muscle tone coordination. No cranial nerve deficit. Skin: Skin is warm and dry. No rash noted. Not diaphoretic. No erythema. No pallor.  Psychiatric: Normal mood and affect. Behavior, judgment, thought content normal.   Labs on Admission:  Basic Metabolic Panel:  Lab 01/18/12 1610 01/18/12 1722  NA 144 139  K 3.6 3.7  CL 107 103  CO2 -- 26  GLUCOSE 76 77  BUN 16 15  CREATININE 1.20 1.09  CALCIUM -- 9.9  MG -- --  PHOS -- --   Liver Function Tests:  Lab 01/18/12 1722  AST 17  ALT 11  ALKPHOS 72  BILITOT 0.3  PROT 6.8  ALBUMIN 4.0   CBC:  Lab 01/18/12 1802 01/18/12 1722  WBC -- 8.1  NEUTROABS -- 4.1  HGB 13.6 12.8*  HCT 40.0 37.0*  MCV -- 87.7  PLT -- 221   Cardiac Enzymes:  Lab 01/18/12 1723  CKTOTAL --  CKMB --  CKMBINDEX --  TROPONINI <0.30    CBG:  Lab 01/18/12 1712 01/18/12 1628  GLUCAP 88 91    Radiological Exams on Admission: Ct Head Wo Contrast 01/18/2012  * IMPRESSION:  1.  No acute abnormality. 2.  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 3.  Minimal chronic small vessel white matter ischemic changes in both cerebral hemispheres.     Mr Brain Wo Contrast 01/18/2012  *  IMPRESSION: Acute infarction in a watershed distribution of the right posterior frontal and parietal region.  Mild swelling but no hemorrhage or mass effect/shift.  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      EKG: Normal sinus rhythm, no ST/T wave changes  Time spent: 80 minutes

## 2012-01-19 NOTE — Progress Notes (Signed)
  Echocardiogram 2D Echocardiogram has been performed.  Elijah Sandoval [name] 01/19/2012, 4:05 PM

## 2012-01-19 NOTE — Progress Notes (Signed)
OT Cancellation Note  Patient Details Name: Elijah Sandoval MRN: [id number] DOB: February 26, 1951   Cancelled Treatment:    Reason Eval/Treat Not Completed: Patient at procedure or test/ unavailable (MRI).  Pt requesting to participate in therapy but transport arrived and ready to take pt to MRI.  Will re-attempt next date.  01/19/2012 Cipriano Mile OTR/L Pager [phone] Office [phone]

## 2012-01-20 ENCOUNTER — Encounter (HOSPITAL_COMMUNITY): Payer: Self-pay | Admitting: General Practice

## 2012-01-20 DIAGNOSIS — F17200 Nicotine dependence, unspecified, uncomplicated: Secondary | ICD-10-CM

## 2012-01-20 DIAGNOSIS — Z720 Tobacco use: Secondary | ICD-10-CM

## 2012-01-20 DIAGNOSIS — E785 Hyperlipidemia, unspecified: Secondary | ICD-10-CM

## 2012-01-20 LAB — GLUCOSE, CAPILLARY
Glucose-Capillary: 105 mg/dL — ABNORMAL HIGH (ref 70–99)
Glucose-Capillary: 112 mg/dL — ABNORMAL HIGH (ref 70–99)

## 2012-01-20 LAB — LIPID PANEL
LDL Cholesterol: 156 mg/dL — ABNORMAL HIGH (ref 0–99)
Triglycerides: 90 mg/dL (ref <150)
VLDL: 18 mg/dL (ref 0–40)

## 2012-01-20 LAB — HEMOGLOBIN A1C: Hgb A1c MFr Bld: 6.4 % — ABNORMAL HIGH (ref <5.7)

## 2012-01-20 MED ORDER — ASPIRIN 325 MG PO TABS
325.0000 mg | ORAL_TABLET | Freq: Every day | ORAL | Status: DC
  Administered 2012-01-20: 325 mg via ORAL
  Filled 2012-01-20: qty 1

## 2012-01-20 MED ORDER — ATORVASTATIN CALCIUM 40 MG PO TABS: 40.0000 mg | ORAL_TABLET | Freq: Every day | ORAL | Status: AC

## 2012-01-20 MED ORDER — CLOPIDOGREL BISULFATE 75 MG PO TABS: 75.0000 mg | ORAL_TABLET | Freq: Every day | ORAL | Status: DC

## 2012-01-20 NOTE — ED Provider Notes (Signed)
Please see prior attestation  Hilario Quarry, MD 01/20/12 1651

## 2012-01-20 NOTE — Evaluation (Signed)
Occupational Therapy Evaluation Patient Details Name: Elijah Sandoval MRN: [id number] DOB: Apr 06, 1951 Today's Date: 01/20/2012 Time: 1040-1106 OT Time Calculation (min): 26 min  OT Assessment / Plan / Recommendation Clinical Impression    Elijah Sandoval is 61 y/o male admitted with acute left sided weakness and gait instability. MRI confirms acute infarction in a watershed distribution within the right posterior frontal and parietal region. 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 This patient presents to OT today with left sided weakness affecting the patients balance and gait with ADL activity. Will benefit from OT in the acute setting to address the below deficits so as to maximize safety with ADL activity  for d/c home     OT Assessment  Patient needs continued OT Services    Follow Up Recommendations  Outpatient OT       Equipment Recommendations  None recommended by OT    Recommendations for Other Services    Frequency  Min 2X/week    Precautions / Restrictions Precautions Precautions: Fall Precaution Comments: pt with dizziness during mobility Restrictions Weight Bearing Restrictions: No       ADL  Grooming: Performed;Wash/dry face;Wash/dry hands;Min guard Where Assessed - Grooming: Unsupported standing Upper Body Bathing: Simulated;Set up Where Assessed - Upper Body Bathing: Unsupported sitting Lower Body Bathing: Simulated;Min guard Where Assessed - Lower Body Bathing: Unsupported sit to stand Upper Body Dressing: Performed;Set up Where Assessed - Upper Body Dressing: Unsupported sitting Lower Body Dressing: Performed;Min guard Where Assessed - Lower Body Dressing: Unsupported sit to stand Toilet Transfer: Performed;Min guard Toilet Transfer Method: Sit to Barista: Comfort height toilet;Grab bars Toileting - Architect and Hygiene: Simulated;Min  guard Where Assessed - Engineer, mining and Hygiene: Standing Tub/Shower Transfer Method: Not assessed    OT Diagnosis: Generalized weakness  OT Problem List: Decreased strength;Decreased safety awareness OT Treatment Interventions: Self-care/ADL training;Patient/family education;Neuromuscular education   OT Goals Acute Rehab OT Goals OT Goal Formulation: With patient ADL Goals Pt Will Perform Grooming: with modified independence;Standing at sink ADL Goal: Grooming - Progress: Goal set today Pt Will Transfer to Toilet: with modified independence;Comfort height toilet ADL Goal: Toilet Transfer - Progress: Goal set today Arm Goals Additional Arm Goal #1: Pt will perform HEP for LUE to increase strength in order for pt to return to 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  Visit Information  Last OT Received On: 01/20/12 Assistance Needed: +1    Subjective Data  Subjective: my wife should be here by now   Prior Functioning     Home Living Lives With: Spouse Available Help at Discharge: Family;Available 24 hours/day Type of Home: House Home Access: Stairs to enter [employer] of Steps: 3 Entrance Stairs-Rails: Can reach both Home Layout: Two level;Able to live on main level with bedroom/bathroom Bathroom Shower/Tub: Health visitor: Handicapped height Bathroom Accessibility: Yes How Accessible: Accessible via walker Home Adaptive Equipment: Grab bars around toilet;Grab bars in shower;Walker - four wheeled;Straight cane Additional Comments: walker and canes belong to his wife from when she had chemotherapy  Prior Function Level of Independence: Independent Able to Take Stairs?: Yes Driving: Yes 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  focused on wife not being here yet) Arousal/Alertness: Awake/alert Orientation Level: Appears intact for tasks assessed Behavior During Session: [facility] for tasks performed Current Attention Level: Alternating;Selective Attention - Other Comments: pt easily distracted with surrounding environment however able to shift back to task when directed Cognition - Other Comments: very pleasant and determined    Extremity/Trunk Assessment Right Upper Extremity Assessment RUE ROM/Strength/Tone: Within functional levels RUE Sensation: WFL - Light Touch Left Upper Extremity Assessment LUE ROM/Strength/Tone: Deficits LUE ROM/Strength/Tone Deficits: 4+/5 grossly, grip 4/5 LUE Sensation: WFL - Light Touch Right Lower Extremity Assessment RLE ROM/Strength/Tone: Within functional levels RLE Sensation: WFL - Light Touch Left Lower Extremity Assessment LLE ROM/Strength/Tone: Deficits LLE ROM/Strength/Tone Deficits: hip flexion, abd/add grossly 4/5, knee extension/flex 5/5, DF/PF 5/5 LLE Sensation: WFL - Light Touch LLE Coordination: Deficits LLE Coordination Deficits: slight delay with gait  Trunk Assessment Trunk Assessment: Normal     Mobility Bed Mobility Bed Mobility: Supine to Sit Supine to Sit: 6: Modified independent (Device/Increase time);HOB flat Details for Bed Mobility Assistance: increased time and effort, pt c/o dizziness upon sitting up Transfers Transfers: Sit to Stand;Stand to Sit Sit to Stand: 4: Min guard;With upper extremity assist;From bed;From chair/3-in-1;With armrests;From toilet Stand to Sit: 5: Supervision;With upper extremity assist;To chair/3-in-1;To toilet Details for Transfer Assistance: increased effort standing from lower surface with definite need of upper extremities to assist, mingaurdA for safety and stability assist as pt slight unsteady initially upon standing        Exercise General  Exercises - Lower Extremity Hip Flexion/Marching: AROM;Both;10 reps;Seated Toe Raises: AROM;10 reps;Both;Seated Heel Raises: AROM;10 reps;Both;Seated Other Exercises Other Exercises: VOR x1 exercise, pt able to perform 5x with specific cueing/demonstration for technique   Balance Static Standing Balance Single Leg Stance - Right Leg:  (mingaurdA for safety) Single Leg Stance - Left Leg:  (mingaurdA for safety) Standardized Balance Assessment Standardized Balance Assessment: Berg Balance Test Berg Balance Test Sit to Stand: Able to stand  independently using hands Standing Unsupported: Able to stand safely 2 minutes Sitting with Back Unsupported but Feet Supported on Floor or Stool: Able to sit safely and securely 2 minutes Stand to Sit: Controls descent by using hands Transfers: Able to transfer safely, definite need of hands Standing Unsupported with Eyes Closed: Able to stand 10 seconds with supervision Standing Ubsupported with Feet Together: Able to place feet together independently and stand for 1 minute with supervision From Standing, Reach Forward with Outstretched Arm: Can reach forward >12 cm safely (5") From Standing Position, Pick up Object from Floor: Able to pick up shoe, needs supervision From Standing Position, Turn to Look Behind Over each Shoulder: Looks behind one side only/other side shows less weight shift Turn 360 Degrees: Able to turn 360 degrees safely but slowly Standing Unsupported, Alternately Place Feet on Step/Stool: Able to complete >2 steps/needs minimal assist Standing Unsupported, One Foot in Front: Able to take small step independently and hold 30 seconds Standing on One Leg: Able to lift leg independently and hold equal to or more than 3 seconds Total Score: 39    End of Session OT - End of Session Activity Tolerance: Patient tolerated treatment well Patient left: in chair;with call bell/phone within reach  GO     Schawn Byas D 01/20/2012,  11:13 AM

## 2012-01-20 NOTE — Care Management Note (Signed)
    Page 1 of 1   01/20/2012     4:00:02 PM   CARE MANAGEMENT NOTE 01/20/2012  Patient:  Elijah Sandoval, Elijah Sandoval   Account Number:  [id number]  Date Initiated:  01/20/2012  Documentation initiated by:  Onnie Boer  Subjective/Objective Assessment:   PT WAS ADMITTED WITH DIZZINESS AND WEAKNESS     Action/Plan:   PROGRESSION OF CARE AND DISCHARGE PLANNING   Anticipated DC Date:  01/21/2012   Anticipated DC Plan:  HOME/SELF CARE      DC Planning Services  CM consult      Choice offered to / List presented to:             Status of service:  In process, will continue to follow Medicare Important Message given?   (If response is "NO", the following Medicare IM given date fields will be blank) Date Medicare IM given:   Date Additional Medicare IM given:    Discharge Disposition:    Per UR Regulation:  Reviewed for med. necessity/level of care/duration of stay  If discussed at Long Length of Stay Meetings, dates discussed:    Comments:  01/20/12 Onnie Boer, RN, BSN 1558 PT WAS ADMITTED WITH CVA.  PT HAS BEEN SET UP WITH OP PT/OT AT THE [facility].  WILL F/U ON DC NEEDS.

## 2012-01-20 NOTE — Evaluation (Signed)
Physical Therapy Evaluation Patient Details Name: Elijah Sandoval MRN: [id number] DOB: December 03, 1950 Today's Date: 01/20/2012 Time: 4098-1191 PT Time Calculation (min): 32 min  PT Assessment / Plan / Recommendation Clinical Impression  Mr. Hopkin is 61 y/o male admitted with acute left sided weakness and gait instability. MRI confirms acute infarction in a watershed distribution within the right posterior frontal and parietal region. 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 This patient presents to PT today with left sided weakness affecting the patients balance and gait. Will benefit physical therapy in the acute setting to address the below deficits so as to maximize safety for d/c home. Rec OPPT for continued therapies for balance.     PT Assessment  Patient needs continued PT services    Follow Up Recommendations  Outpatient PT with Supervision for mobility/OOB at home    Does the patient have the potential to tolerate intense rehabilitation      Barriers to Discharge        Equipment Recommendations  None recommended by PT    Recommendations for Other Services OT consult   Frequency Min 4X/week    Precautions / Restrictions Precautions Precautions: Fall Precaution Comments: pt with dizziness during mobility Restrictions Weight Bearing Restrictions: No   Pertinent Vitals/Pain Pt c/o at least 5/10 dizziness throughout session, it subsided with visual targeting/stabilization      Mobility  Bed Mobility Bed Mobility: Supine to Sit Supine to Sit: 6: Modified independent (Device/Increase time);HOB flat Details for Bed Mobility Assistance: increased time and effort, pt c/o dizziness upon sitting up Transfers Transfers: Sit to Stand;Stand to Sit Sit to Stand: 4: Min guard;With upper extremity assist;From bed;From chair/3-in-1;With armrests Stand to Sit: 5: Supervision;With upper extremity assist;To  chair/3-in-1 Details for Transfer Assistance: increased effort standing from lower surface with definite need of upper extremities to assist, mingaurdA for safety and stability assist as pt slight unsteady initially upon standing Ambulation/Gait Ambulation/Gait Assistance: 4: Min guard Ambulation Distance (Feet): 200 Feet Assistive device: None Ambulation/Gait Assistance Details: mingaurdA for safety and stability as pt easily distracted by surroundings with delayed balance reactions, cues for forward gaze to decrease dizziness he c/o throughout session Gait Pattern: Narrow base of support General Gait Details: appears to ambulate with toes in on the right on the lateral border of his foot, slight imbalance during gait with delayed balance/righting reactions, at times he appears to scissor and the left leg appears delayed with decreased hip/knee flexion during swing Stairs: Yes Stairs Assistance: 5: Supervision Stairs Assistance Details (indicate cue type and reason): cues for safe speed and technique Stair Management Technique: Two rails Number of Stairs: 15  Modified Rankin (Stroke Patients Only) Pre-Morbid Rankin Score: No symptoms Modified Rankin: Moderate disability    Shoulder Instructions     Exercises General Exercises - Lower Extremity Hip Flexion/Marching: AROM;Both;10 reps;Seated Toe Raises: AROM;10 reps;Both;Seated Heel Raises: AROM;10 reps;Both;Seated Other Exercises Other Exercises: VOR x1 exercise, pt able to perform 5x with specific cueing/demonstration for technique   PT Diagnosis: Difficulty walking;Abnormality of gait;Generalized weakness  PT Problem List: Decreased strength;Decreased balance;Decreased mobility;Decreased cognition PT Treatment Interventions: DME instruction;Gait training;Functional mobility training;Stair training;Therapeutic activities;Therapeutic exercise;Balance training;Neuromuscular re-education;Patient/family education;Cognitive remediation    PT Goals Acute Rehab PT Goals PT Goal Formulation: With patient Time For Goal Achievement: 01/27/12 Potential to Achieve Goals: Good Pt will go Sit to Stand: with modified independence PT Goal: Sit to Stand - Progress: Goal set today Pt will  go Stand to Sit: with modified independence PT Goal: Stand to Sit - Progress: Goal set today Pt will Transfer Bed to Chair/Chair to Bed: with modified independence PT Transfer Goal: Bed to Chair/Chair to Bed - Progress: Goal set today Pt will Ambulate: >150 feet;with modified independence;with least restrictive assistive device PT Goal: Ambulate - Progress: Goal set today Pt will Go Up / Down Stairs: 3-5 stairs;with rail(s);with modified independence PT Goal: Up/Down Stairs - Progress: Goal set today Pt will Perform Home Exercise Program: Independently PT Goal: Perform Home Exercise Program - Progress: Goal set today Additional Goals Additional Goal #1: The patient will demonstrate decreased risk of falls with  44/56 on the Berg balance test.  PT Goal: Additional Goal #1 - Progress: Goal set today  Visit Information  Last PT Received On: 01/20/12 Assistance Needed: +1    Subjective Data  Subjective: I've been waiting on you to get here. I want to walk.  Patient Stated Goal: Walk and be independent, get back to work   Prior Functioning  Home Living Lives With: Spouse Available Help at Discharge: Family;Available 24 hours/day Type of Home: House Home Access: Stairs to enter [employer] of Steps: 3 Entrance Stairs-Rails: Can reach both Home Layout: Two level;Able to live on main level with bedroom/bathroom Bathroom Shower/Tub: Health visitor: Handicapped height Bathroom Accessibility: Yes How Accessible: Accessible via walker Home Adaptive Equipment: Grab bars around toilet;Grab bars in shower;Walker - four wheeled;Straight cane Additional Comments: walker and canes belong to his wife from when she had  chemotherapy  Prior Function Level of Independence: Independent Able to Take Stairs?: Yes Driving: Yes 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, 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    Cognition  Overall Cognitive Status: Impaired Area of Impairment: Attention Arousal/Alertness: Awake/alert Orientation Level: Oriented X4 / Intact Behavior During Session: [facility] for tasks performed Current Attention Level: Alternating;Selective Attention - Other Comments: pt easily distracted with surrounding environment however able to shift back to task when directed Cognition - Other Comments: very pleasant and determined    Extremity/Trunk Assessment Right Upper Extremity Assessment RUE ROM/Strength/Tone: Within functional levels RUE Sensation: WFL - Light Touch Left Upper Extremity Assessment LUE ROM/Strength/Tone: Deficits LUE ROM/Strength/Tone Deficits: 4+/5 grossly, grip 4/5 LUE Sensation: WFL - Light Touch Right Lower Extremity Assessment RLE ROM/Strength/Tone: Within functional levels RLE Sensation: WFL - Light Touch Left Lower Extremity Assessment LLE ROM/Strength/Tone: Deficits LLE ROM/Strength/Tone Deficits: hip flexion, abd/add grossly 4/5, knee extension/flex 5/5, DF/PF 5/5 LLE Sensation: WFL - Light Touch LLE Coordination: Deficits LLE Coordination Deficits: slight delay with gait  Trunk Assessment Trunk Assessment: Normal   Balance Static Standing Balance Single Leg Stance - Right Leg:  (mingaurdA for safety) Single Leg Stance - Left Leg:  (mingaurdA for safety) Standardized Balance Assessment Standardized Balance Assessment: Berg Balance Test Berg Balance Test Sit to Stand: Able to stand  independently using hands Standing Unsupported: Able to stand safely 2 minutes Sitting with Back Unsupported but Feet Supported on Floor or Stool: Able to sit safely and securely 2 minutes Stand to Sit: Controls descent by using hands Transfers: Able to  transfer safely, definite need of hands Standing Unsupported with Eyes Closed: Able to stand 10 seconds with supervision Standing Ubsupported with Feet Together: Able to place feet together independently and stand for 1 minute with supervision From Standing, Reach Forward with Outstretched Arm: Can reach forward >12 cm safely (5") From Standing Position, Pick up Object from Floor: Able to pick up shoe, needs supervision  From Standing Position, Turn to Look Behind Over each Shoulder: Looks behind one side only/other side shows less weight shift Turn 360 Degrees: Able to turn 360 degrees safely but slowly Standing Unsupported, Alternately Place Feet on Step/Stool: Able to complete >2 steps/needs minimal assist Standing Unsupported, One Foot in Front: Able to take small step independently and hold 30 seconds Standing on One Leg: Able to lift leg independently and hold equal to or more than 3 seconds Total Score: 39   End of Session PT - End of Session Equipment Utilized During Treatment: Gait belt Activity Tolerance: Patient tolerated treatment well Patient left: in chair;with call bell/phone within reach Nurse Communication: Mobility status  GP     [facility] HELEN 01/20/2012, 9:06 AM

## 2012-01-20 NOTE — Progress Notes (Signed)
Stroke Team Progress Note  HISTORY  Elijah Sandoval is an 61 y.o. male who presented to the [facility] ED with acute left sided weakness and gait instability. He also experienced a sensation of room-spinning, per ED, but on my interview denies this. The patient states that he is on "pills for diabetes" and takes ASA sporadically.   CT at [facility] revealed an old right cerebellar hemisphere infarct and possible old left cerebellar hemisphere infarct. Minimal chronic small vessel white matter ischemic changes in both cerebral hemispheres were noted. A cystic dilatation of the anterior body of the left lateral ventricle was also present.   An MRI brain was subsequently obtained, revealing an acute infarction in a watershed distribution within the right posterior frontal and parietal region. Mild swelling but no hemorrhage or mass effect/shift was noted. 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  EKG negative for atrial fibrillation or flutter.    SUBJECTIVE  Sitting in chair.  Overall he feels his condition is gradually improving.   OBJECTIVE Most recent Vital Signs: Filed Vitals:   01/19/12 2131 01/20/12 0156 01/20/12 0646 01/20/12 0958  BP: 117/53 110/60 121/74 119/62  Pulse: 78 77 74 57  Temp: 98.1 F (36.7 C) 98.2 F (36.8 C) 98.5 F (36.9 C) 98.1 F (36.7 C)  TempSrc: Oral Oral Oral Oral  Resp: 18 18 18 18   Height:      Weight:      SpO2: 100% 100% 100% 100%   CBG (last 3)   Basename 01/20/12 0645 01/19/12 2129 01/19/12 1646  GLUCAP 112* 105* 107*   Intake/Output from previous day: 10/06 0701 - 10/07 0700 In: -  Out: 800 [Urine:800]  IV Fluid Intake:     MEDICATIONS    . sodium chloride   Intravenous STAT  . aspirin  325 mg Oral Daily  . atorvastatin  40 mg Oral q1800  . enoxaparin (LOVENOX) injection  40 mg Subcutaneous  QHS  . insulin aspart  0-15 Units Subcutaneous TID WC  . insulin aspart  0-5 Units Subcutaneous QHS  . DISCONTD: enoxaparin (LOVENOX) injection  40 mg Subcutaneous Q24H  . DISCONTD: metFORMIN  500 mg Oral BID WC   PRN:  gadobenate dimeglumine, senna-docusate  Diet:  Carb Control thin liquids Activity:  Up with assistance DVT Prophylaxis:  lovenox  CLINICALLY SIGNIFICANT STUDIES Basic Metabolic Panel:  Lab 01/18/12 1610 01/18/12 1722  NA 144 139  K 3.6 3.7  CL 107 103  CO2 -- 26  GLUCOSE 76 77  BUN 16 15  CREATININE 1.20 1.09  CALCIUM -- 9.9  MG -- --  PHOS -- --   Liver Function Tests:  Lab 01/18/12 1722  AST 17  ALT 11  ALKPHOS 72  BILITOT 0.3  PROT 6.8  ALBUMIN 4.0   CBC:  Lab 01/18/12 1802 01/18/12 1722  WBC -- 8.1  NEUTROABS -- 4.1  HGB 13.6 12.8*  HCT 40.0 37.0*  MCV -- 87.7  PLT -- 221   Coagulation:  Lab 01/18/12 1722  LABPROT 12.3  INR 0.92   Cardiac Enzymes:  Lab 01/18/12 1723  CKTOTAL --  CKMB --  CKMBINDEX --  TROPONINI <0.30   Lipid Panel    Component Value Date/Time   CHOL 212* 01/20/2012 0720   TRIG 90 01/20/2012 0720   HDL 38* 01/20/2012 0720   CHOLHDL 5.6 01/20/2012 0720  VLDL 18 01/20/2012 0720   LDLCALC 156* 01/20/2012 0720   Dg Chest 2 View 01/19/2012  Mild central airway thickening.  No acute cardiopulmonary process identified.     Ct Head Wo Contrast 01/18/2012    1.  No acute abnormality. 2.  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 3.  Minimal chronic small vessel white matter ischemic changes in both cerebral hemispheres.       Mr Maxine Glenn Head/Neck Wo Contrast 01/19/2012  No anterior circulation disease noted in the neck.  No significant posterior circulation disease in the neck.  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   Normal intracranial MR angiography of the large and medium-sized vessels.     Mr Brain Wo Contrast 01/18/2012  Acute infarction in a watershed  distribution of the right posterior frontal and parietal region.  Mild swelling but no hemorrhage or mass effect/shift.  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   .    2D Echocardiogram    Carotid Doppler    CXR  Mild central airway thickening. No acute cardiopulmonary process identified.  EKG  normal sinus rhythm.   Therapy Recommendations PT - ; OT - ; ST -   Physical Exam   Pleasant middle-aged African American gentleman currently not in distress.Awake alert. Afebrile. Head is nontraumatic. Neck is supple without bruit. Hearing is normal. Cardiac exam no murmur or gallop. Lungs are clear to auscultation. Distal pulses are well felt.  Neurological Exam : Awake alert oriented x 3 normal speech and language. fundi not visualized. Vision acuity and fields appear normal.. Mild left lower face asymmetry. Tongue midline. No drift. Mild diminished fine finger movements on left. Orbits right over left upper extremity. Mild left grip weak.. Normal sensation . Normal coordination.  ASSESSMENT [name] is a 61 y.o. male presenting with left hemiparesis and ataxia  Imaging confirms an acute infarction in a watershed distribution of the right posterior frontal and parietal region.  Mild swelling but no hemorrhage or mass effect/shift. Infarct felt to be embolic secondary to distribution.  Work up underway. On none prior to admission. Now on plavix 75mg  a day for secondary stroke prevention. Patient with resultant left sided hemiparesis and gait ataxia.   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  Carotid doppler, 2D Echo pending  Smoking cessation counselling  Risk factor  modification  LDL goal 70 in diabetic patients  Therapy evaluations  Guy Franco, [facility],  MBA, MHA [facility] Pager: [phone] 01/20/2012 10:32 AM  Scribe for Dr. Delia Heady, Stroke Center Medical Director. He has personally reviewed chart, pertinent data, examined the patient and developed the plan of care. Pager:  [phone]

## 2012-01-20 NOTE — Progress Notes (Signed)
VASCULAR LAB PRELIMINARY  PRELIMINARY  PRELIMINARY  PRELIMINARY  Carotid Dopplers completed.    Preliminary report:  There is no ICA stenosis.  Vertebral artery flow is antegrade.  Joanmarie Tsang, 01/20/2012, 12:02 PM

## 2012-01-20 NOTE — Evaluation (Signed)
Speech Language Pathology Evaluation Patient Details Name: Elijah Sandoval MRN: [id number] DOB: 1950-09-01 Today's Date: 01/20/2012 Time: 0147-0210 SLP Time Calculation (min): 23 min  Problem List:  Patient Active Problem List  Diagnosis  . CVA (cerebral infarction)  . HLD (hyperlipidemia)  . Tobacco abuse   Past Medical History:  Past Medical History  Diagnosis Date  . Diabetes mellitus    Past Surgical History:  Past Surgical History  Procedure Date  . No past surgeries    HPI:  Pt. is 61 y/o male with h/x of DM and CVA. Admitted to [facility] on 10/5 with left sided weakness. 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 Pt. lives at home with wife and grandson.    Assessment / Plan / Recommendation Clinical Impression  Pt. seen for speech-language-cognitive evaluation with wife present at bedside. Pt. is oriented x4 and relayed accurate biographical information without difficulty.  Pt. appeared to have intermittent dysarthria, however Pt. and wife report that his speech as well as language-cognition are at baseline. SLP educated Pt. and spouse about availability of outpatient or home health ST if difficulties arise once home. No ST is recommended at this time.    SLP Assessment  Patient does not need any further Speech Lanaguage Pathology Services    Follow Up Recommendations  Home health SLP;Outpatient SLP;Other (comment) (If change from baseline occurs)    Frequency and Duration           SLP Goals  SLP Goals Progress/Goals/Alternative treatment plan discussed with pt/caregiver and they: Agree  SLP Evaluation Prior Functioning  Cognitive/Linguistic Baseline: Within functional limits Type of Home: House Lives With: Spouse Available Help at Discharge: Family;Available 24 hours/day Vocation: Self employed   Cognition  Overall Cognitive Status: Appears within functional limits for tasks assessed Arousal/Alertness: Awake/alert Orientation  Level: Oriented X4 Memory: Appears intact Awareness: Appears intact Problem Solving: Appears intact Safety/Judgment: Appears intact    Comprehension  Auditory Comprehension Overall Auditory Comprehension: Appears within functional limits for tasks assessed Yes/No Questions: Not tested Commands: Within Functional Limits Conversation: Simple Visual Recognition/Discrimination Discrimination: Not tested Reading Comprehension Reading Status: Within funtional limits    Expression Expression Primary Mode of Expression: Verbal Verbal Expression Overall Verbal Expression: Appears within functional limits for tasks assessed Initiation: No impairment Level of Generative/Spontaneous Verbalization: Conversation Repetition: No impairment Naming: No impairment Pragmatics: No impairment Non-Verbal Means of Communication: Not applicable Written Expression Dominant Hand: Right Written Expression: Within Functional Limits   Oral / Motor Oral Motor/Sensory Function Overall Oral Motor/Sensory Function: Appears within functional limits for tasks assessed Labial ROM: Within Functional Limits Labial Symmetry: Within Functional Limits Labial Strength: Within Functional Limits Lingual ROM: Within Functional Limits Lingual Symmetry: Within Functional Limits Lingual Strength: Within Functional Limits Lingual Sensation: Within Functional Limits Facial ROM: Within Functional Limits Facial Symmetry: Within Functional Limits Facial Strength: Within Functional Limits Facial Sensation: Within Functional Limits Velum: Within Functional Limits Mandible: Within Functional Limits Motor Speech Overall Motor Speech: Appears within functional limits for tasks assessed Respiration: Within functional limits Phonation: Normal Resonance: Within functional limits Articulation: Within functional limitis Intelligibility: Intelligible Motor Planning: Witnin functional limits Motor Speech Errors: Not applicable     GO     Theotis Burrow 01/20/2012, 3:14 PM

## 2012-01-20 NOTE — Progress Notes (Signed)
Utilization review complete 

## 2012-01-20 NOTE — Evaluation (Signed)
SLP has reviewed and agrees with student's note below.  Breck Coons [place].Ed [employer] [phone]  01/20/2012

## 2012-01-20 NOTE — Progress Notes (Addendum)
[facility] PROGRESS NOTE  Elijah Sandoval RUE:[id number] DOB: 09-22-1950 DOA: 01/18/2012 PCP: No primary provider on file.  Assessment/Plan: *Acute right ACA/MCA  - identified on MRI brain; in the area of right posterior frontal and parietal region  - appreciate neurology following  - patient passed swallow evaluation  - change ASA 325 daily to plavix 75 daily per Dr. Pearlean Brownie - order placed for atorvastatin 40 mg Q HS  - MRA head neck:  No anterior circulation disease noted in the neck.  No significant posterior circulation disease in the neck. 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 Normal intracranial MR angiography of the large and medium-sized  vessels. ECHO PENDING- called dr Sharyn Lull and asked to read  Diabetes  - start sliding scale restart metformin at D/C - check A1c: pending  HLD -statin  Tobacco abuse -encourage cessation   Code Status: full Family Communication: patient at bedside Disposition Plan: home with outpatient PT   Consultants:  neurology  Procedures:  MRI  HPI/Subjective: Feeling better Wanting to get out of bed  Objective: Filed Vitals:   01/19/12 2131 01/20/12 0156 01/20/12 0646 01/20/12 0958  BP: 117/53 110/60 121/74 119/62  Pulse: 78 77 74 57  Temp: 98.1 F (36.7 C) 98.2 F (36.8 C) 98.5 F (36.9 C) 98.1 F (36.7 C)  TempSrc: Oral Oral Oral Oral  Resp: 18 18 18 18   Height:      Weight:      SpO2: 100% 100% 100% 100%    Intake/Output Summary (Last 24 hours) at 01/20/12 1000 Last data filed at 01/19/12 2132  Gross per 24 hour  Intake      0 ml  Output    800 ml  Net   -800 ml   Filed Weights   01/18/12 1625  Weight: 83.915 kg (185 lb)    Exam:   General:  A+Ox3, NAD  Cardiovascular: rrr  Respiratory: clear anterior, no wheezing  Abdomen: +BS, soft, NT/ND  Data Reviewed: Basic Metabolic Panel:  Lab 01/18/12 1478 01/18/12 1722  NA 144 139  K 3.6 3.7  CL 107 103  CO2 -- 26  GLUCOSE 76 77    BUN 16 15  CREATININE 1.20 1.09  CALCIUM -- 9.9  MG -- --  PHOS -- --   Liver Function Tests:  Lab 01/18/12 1722  AST 17  ALT 11  ALKPHOS 72  BILITOT 0.3  PROT 6.8  ALBUMIN 4.0   No results found for this basename: LIPASE:5,AMYLASE:5 in the last 168 hours No results found for this basename: AMMONIA:5 in the last 168 hours CBC:  Lab 01/18/12 1802 01/18/12 1722  WBC -- 8.1  NEUTROABS -- 4.1  HGB 13.6 12.8*  HCT 40.0 37.0*  MCV -- 87.7  PLT -- 221   Cardiac Enzymes:  Lab 01/18/12 1723  CKTOTAL --  CKMB --  CKMBINDEX --  TROPONINI <0.30   BNP (last 3 results) No results found for this basename: PROBNP:3 in the last 8760 hours CBG:  Lab 01/20/12 0645 01/19/12 2129 01/19/12 1646 01/19/12 1116 01/18/12 1712  GLUCAP 112* 105* 107* 118* 88    No results found for this or any previous visit (from the past 240 hour(s)).   Studies: Dg Chest 2 View  01/19/2012  *RADIOLOGY REPORT*  Clinical Data: Stroke.  History of diabetes and smoking.  CHEST - 2 VIEW  Comparison: None.  Findings: The heart size and mediastinal contours are normal. There is mild central airway thickening without focal airspace  disease or hyperinflation.  There is no pleural effusion. Increased density over the mid thoracic spine on the lateral view is probably secondary to paraspinal osteophytes.  IMPRESSION: Mild central airway thickening.  No acute cardiopulmonary process identified.   Original Report Authenticated By: Gerrianne Scale, M.D.    Ct Head Wo Contrast  01/18/2012  *RADIOLOGY REPORT*  Clinical Data: Sudden onset of imbalance this morning.  Band-like sensation around the head for several months.  CT HEAD WITHOUT CONTRAST  Technique:  Contiguous axial images were obtained from the base of the skull through the vertex without contrast.  Comparison: Brain MR report dated 04/16/2000.  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  The previous brain MR report described old right cerebellar  lacunar infarcts. Small, old left cerebellar infarct or focally prominent subarachnoid space.  Normal size and position of the ventricles. Minimal patchy white matter low density in both cerebral hemispheres.  No intracranial hemorrhage, mass lesion or CT evidence of acute infarction. Bilateral internal carotid artery atheromatous calcification.  Mild sphenoid sinus mucosal thickening.  Unremarkable bones.  Multiple small areas of calcific density in the skin and subcutaneous fat of the forehead bilaterally.  IMPRESSION:  1.  No acute abnormality. 2.  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 3.  Minimal chronic small vessel white matter ischemic changes in both cerebral hemispheres.   Original Report Authenticated By: Darrol Angel, M.D.    Mr Maxine Glenn Head Wo Contrast  01/19/2012  *RADIOLOGY REPORT*  Clinical Data:  Right hemispheric stroke.  Acute but ill-defined vascular disease.  MRA NECK WITHOUT AND WITH CONTRAST  Technique:  Angiographic images of the neck were obtained using MRA technique without and with intravenous contrast.  Carotid stenosis measurements (when applicable) are obtained utilizing NASCET criteria, using the distal internal carotid diameter as the denominator.  Contrast: 18mL MULTIHANCE GADOBENATE DIMEGLUMINE 529 MG/ML IV SOLN  Comparison:  MRI head 01/18/2012.  Findings:  Branching pattern of the brachiocephalic vessels from the arch is normal.  No origin stenoses.  Both common carotid arteries are widely patent to their respective bifurcation.  No stenosis or irregularity of either carotid bifurcation.  Both cervical internal carotid arteries appear normal.  Both vertebral arteries are approximately equal in size and widely patent.  There is perhaps 30% narrowing of the left vertebral artery origin, probably not significant.  Beyond that, both vertebral arteries are normal.  IMPRESSION: No anterior circulation disease noted in the neck.  No  significant posterior circulation disease in the neck.  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  MRA HEAD WITHOUT CONTRAST  Technique:  Angiographic images of the Circle of Willis were obtained using MRA technique without intravenous contrast.  Findings:  Both internal carotid arteries are widely patent to the siphon regions.  The anterior and middle cerebral vessels are normal without proximal stenosis, aneurysm or vascular malformation.  Both vertebral arteries are widely patent to the basilar.  No basilar stenosis.  Posterior circulation branch vessels appear normal.  IMPRESSION: Normal intracranial MR angiography of the large and medium-sized vessels.   Original Report Authenticated By: Thomasenia Sales, M.D.    Mr Angiogram Neck W Wo Contrast  01/19/2012  *RADIOLOGY REPORT*  Clinical Data:  Right hemispheric stroke.  Acute but ill-defined vascular disease.  MRA NECK WITHOUT AND WITH CONTRAST  Technique:  Angiographic images of the neck were obtained using MRA technique without and with intravenous contrast.  Carotid stenosis measurements (when applicable) are  obtained utilizing NASCET criteria, using the distal internal carotid diameter as the denominator.  Contrast: 18mL MULTIHANCE GADOBENATE DIMEGLUMINE 529 MG/ML IV SOLN  Comparison:  MRI head 01/18/2012.  Findings:  Branching pattern of the brachiocephalic vessels from the arch is normal.  No origin stenoses.  Both common carotid arteries are widely patent to their respective bifurcation.  No stenosis or irregularity of either carotid bifurcation.  Both cervical internal carotid arteries appear normal.  Both vertebral arteries are approximately equal in size and widely patent.  There is perhaps 30% narrowing of the left vertebral artery origin, probably not significant.  Beyond that, both vertebral arteries are normal.  IMPRESSION: No anterior circulation disease noted in the neck.  No significant posterior circulation disease in the neck.  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  MRA HEAD WITHOUT CONTRAST  Technique:  Angiographic images of the Circle of Willis were obtained using MRA technique without intravenous contrast.  Findings:  Both internal carotid arteries are widely patent to the siphon regions.  The anterior and middle cerebral vessels are normal without proximal stenosis, aneurysm or vascular malformation.  Both vertebral arteries are widely patent to the basilar.  No basilar stenosis.  Posterior circulation branch vessels appear normal.  IMPRESSION: Normal intracranial MR angiography of the large and medium-sized vessels.   Original Report Authenticated By: Thomasenia Sales, M.D.    Mr Brain Wo Contrast  01/18/2012  *RADIOLOGY REPORT*  Clinical Data: Dizziness.  Headache.  Pressure.  MRI HEAD WITHOUT CONTRAST  Technique:  Multiplanar, multiecho pulse sequences of the brain and surrounding structures were obtained according to standard protocol without intravenous contrast.  Comparison: Head CT same day  Findings: Diffusion imaging shows acute infarction in a watershed territory affecting the right hemisphere in the posterior frontal to parietal region.  The areas of infarction show mild swelling but no hemorrhage or mass effect/shift.  There are old infarctions within the cerebellum, right more than left.  No focal brain stem insult.  The cerebral hemispheres otherwise show an old infarction affecting the corpus callosum and pericallosal white matter to the left of midline.  There are some chronic small vessel changes in the hemispheric deep white matter. No mass lesion, hemorrhage or obstructive hydrocephalus.  No pituitary mass.  Sinuses, middle ears and mastoids are clear except for mucosal thickening affecting the sphenoid sinus.  IMPRESSION: Acute infarction in a watershed distribution of the right posterior frontal and parietal region.  Mild swelling but no hemorrhage or mass effect/shift.  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  Old  infarction of the corpus callosum and pericallosal white matter.   Original Report Authenticated By: Thomasenia Sales, M.D.     Scheduled Meds:   . sodium chloride   Intravenous STAT  . aspirin  325 mg Oral Daily  . atorvastatin  40 mg Oral q1800  . enoxaparin (LOVENOX) injection  40 mg Subcutaneous QHS  . insulin aspart  0-15 Units Subcutaneous TID WC  . insulin aspart  0-5 Units Subcutaneous QHS  . DISCONTD: enoxaparin (LOVENOX) injection  40 mg Subcutaneous Q24H  . DISCONTD: metFORMIN  500 mg Oral BID WC   Continuous Infusions:   Principal Problem:  *CVA (cerebral infarction)    Time spent: 25    Marlin Canary  [facility] Pager [phone]. If 8PM-8AM, please contact night-coverage at [email or web address], password [email or web address] 01/20/2012, 10:00 AM  LOS: 2 days

## 2012-01-21 NOTE — Discharge Summary (Signed)
Physician Discharge Summary  Elijah Sandoval WUJ:[id number] DOB: Apr 21, 1950 DOA: 01/18/2012  PCP: No primary provider on file.  Admit date: 01/18/2012 Discharge date: 01/21/2012  Recommendations for Outpatient Follow-up:  1. Outpatient PT  Discharge Diagnoses:  Principal Problem:  *CVA (cerebral infarction) Active Problems:  HLD (hyperlipidemia)  Tobacco abuse   Discharge Condition: improved  Diet recommendation: heart health  Filed Weights   01/18/12 1625  Weight: 83.915 kg (185 lb)    History of present illness:  61 year old male with history of diabetes who presented with sudden onset left sided weakness and dizziness but no loss of consciousness. Patient reported his symptoms currently resolved and although he still feels somewhat out of balance when he attempted to stand up it is significantly better since the time of arrival to ED. No complaints of chest pain, no shortness of breath, no palpitations. No abdominal pain, no nausea or vomiting, no reports of blood in stool or urine   Hospital Course:  Acute right ACA/MCA  - identified on MRI brain; in the area of right posterior frontal and parietal region  - appreciate neurology following  - patient passed swallow evaluation  - change ASA 325 daily to plavix 75 daily per Dr. Pearlean Brownie  - order placed for atorvastatin 40 mg Q HS  - MRA head neck: No anterior circulation disease noted in the neck.  No significant posterior circulation disease in the neck. 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  Normal intracranial MR angiography of the large and medium-sized  vessels.  ECHO PENDING ok   Diabetes  - start sliding scale restart metformin at D/C  - check A1c: pending   HLD  -statin   Tobacco abuse  -encourage cessation      Procedures:  Echo: Left ventricle: The cavity size was normal. Systolic function was normal. The estimated ejection fraction was in the range of 50% to 55%. Wall motion was normal;  there were no regional wall motion abnormalities.    Consultations:  Neuro    Discharge Exam: Filed Vitals:   01/20/12 0646 01/20/12 0958 01/20/12 1439 01/20/12 1814  BP: 121/74 119/62 127/57 132/76  Pulse: 74 57 64 76  Temp: 98.5 F (36.9 C) 98.1 F (36.7 C) 98 F (36.7 C) 97.9 F (36.6 C)  TempSrc: Oral Oral Oral Oral  Resp: 18 18 18 18   Height:      Weight:      SpO2: 100% 100% 99% 100%    General: A+Ox3, NAD Cardiovascular: rrr Respiratory: clear anterior  Discharge Instructions  Discharge Orders    Future Orders Please Complete By Expires   Diet - low sodium heart healthy      Diet Carb Modified      Increase activity slowly      Discharge instructions      Comments:   Outpatient PT   Driving Restrictions      Comments:   None until seen by PCP   Discharge instructions      Comments:   Stop smoking FLP/LFTs in 6 weeks       Medication List     As of 01/21/2012 12:43 PM    TAKE these medications         atorvastatin 40 MG tablet   Commonly known as: LIPITOR   Take 1 tablet (40 mg total) by mouth daily at 6 PM.      clopidogrel 75 MG tablet   Commonly known as: PLAVIX   Take 1  tablet (75 mg total) by mouth daily with breakfast.      metFORMIN 500 MG tablet   Commonly known as: GLUCOPHAGE   Take 500 mg by mouth 2 (two) times daily with a meal.           Follow-up Information    Please follow up. (need PCP to continue to follow labs)           The results of significant diagnostics from this hospitalization (including imaging, microbiology, ancillary and laboratory) are listed below for reference.    Significant Diagnostic Studies: Dg Chest 2 View  01/19/2012  *RADIOLOGY REPORT*  Clinical Data: Stroke.  History of diabetes and smoking.  CHEST - 2 VIEW  Comparison: None.  Findings: The heart size and mediastinal contours are normal. There is mild central airway thickening without focal airspace disease or hyperinflation.  There is no  pleural effusion. Increased density over the mid thoracic spine on the lateral view is probably secondary to paraspinal osteophytes.  IMPRESSION: Mild central airway thickening.  No acute cardiopulmonary process identified.   Original Report Authenticated By: Gerrianne Scale, M.D.    Ct Head Wo Contrast  01/18/2012  *RADIOLOGY REPORT*  Clinical Data: Sudden onset of imbalance this morning.  Band-like sensation around the head for several months.  CT HEAD WITHOUT CONTRAST  Technique:  Contiguous axial images were obtained from the base of the skull through the vertex without contrast.  Comparison: Brain MR report dated 04/16/2000.  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  The previous brain MR report described old right cerebellar lacunar infarcts. Small, old left cerebellar infarct or focally prominent subarachnoid space.  Normal size and position of the ventricles. Minimal patchy white matter low density in both cerebral hemispheres.  No intracranial hemorrhage, mass lesion or CT evidence of acute infarction. Bilateral internal carotid artery atheromatous calcification.  Mild sphenoid sinus mucosal thickening.  Unremarkable bones.  Multiple small areas of calcific density in the skin and subcutaneous fat of the forehead bilaterally.  IMPRESSION:  1.  No acute abnormality. 2.  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 3.  Minimal chronic small vessel white matter ischemic changes in both cerebral hemispheres.   Original Report Authenticated By: Darrol Angel, M.D.    Mr Maxine Glenn Head Wo Contrast  01/19/2012  *RADIOLOGY REPORT*  Clinical Data:  Right hemispheric stroke.  Acute but ill-defined vascular disease.  MRA NECK WITHOUT AND WITH CONTRAST  Technique:  Angiographic images of the neck were obtained using MRA technique without and with intravenous contrast.  Carotid stenosis measurements (when applicable) are obtained utilizing NASCET criteria,  using the distal internal carotid diameter as the denominator.  Contrast: 18mL MULTIHANCE GADOBENATE DIMEGLUMINE 529 MG/ML IV SOLN  Comparison:  MRI head 01/18/2012.  Findings:  Branching pattern of the brachiocephalic vessels from the arch is normal.  No origin stenoses.  Both common carotid arteries are widely patent to their respective bifurcation.  No stenosis or irregularity of either carotid bifurcation.  Both cervical internal carotid arteries appear normal.  Both vertebral arteries are approximately equal in size and widely patent.  There is perhaps 30% narrowing of the left vertebral artery origin, probably not significant.  Beyond that, both vertebral arteries are normal.  IMPRESSION: No anterior circulation disease noted in the neck.  No significant posterior circulation disease in the neck.  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  MRA HEAD WITHOUT CONTRAST  Technique:  Angiographic images of  the Circle of Willis were obtained using MRA technique without intravenous contrast.  Findings:  Both internal carotid arteries are widely patent to the siphon regions.  The anterior and middle cerebral vessels are normal without proximal stenosis, aneurysm or vascular malformation.  Both vertebral arteries are widely patent to the basilar.  No basilar stenosis.  Posterior circulation branch vessels appear normal.  IMPRESSION: Normal intracranial MR angiography of the large and medium-sized vessels.   Original Report Authenticated By: Thomasenia Sales, M.D.    Mr Angiogram Neck W Wo Contrast  01/19/2012  *RADIOLOGY REPORT*  Clinical Data:  Right hemispheric stroke.  Acute but ill-defined vascular disease.  MRA NECK WITHOUT AND WITH CONTRAST  Technique:  Angiographic images of the neck were obtained using MRA technique without and with intravenous contrast.  Carotid stenosis measurements (when applicable) are obtained utilizing NASCET criteria, using the distal internal carotid diameter as the  denominator.  Contrast: 18mL MULTIHANCE GADOBENATE DIMEGLUMINE 529 MG/ML IV SOLN  Comparison:  MRI head 01/18/2012.  Findings:  Branching pattern of the brachiocephalic vessels from the arch is normal.  No origin stenoses.  Both common carotid arteries are widely patent to their respective bifurcation.  No stenosis or irregularity of either carotid bifurcation.  Both cervical internal carotid arteries appear normal.  Both vertebral arteries are approximately equal in size and widely patent.  There is perhaps 30% narrowing of the left vertebral artery origin, probably not significant.  Beyond that, both vertebral arteries are normal.  IMPRESSION: No anterior circulation disease noted in the neck.  No significant posterior circulation disease in the neck.  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  MRA HEAD WITHOUT CONTRAST  Technique:  Angiographic images of the Circle of Willis were obtained using MRA technique without intravenous contrast.  Findings:  Both internal carotid arteries are widely patent to the siphon regions.  The anterior and middle cerebral vessels are normal without proximal stenosis, aneurysm or vascular malformation.  Both vertebral arteries are widely patent to the basilar.  No basilar stenosis.  Posterior circulation branch vessels appear normal.  IMPRESSION: Normal intracranial MR angiography of the large and medium-sized vessels.   Original Report Authenticated By: Thomasenia Sales, M.D.    Mr Brain Wo Contrast  01/18/2012  *RADIOLOGY REPORT*  Clinical Data: Dizziness.  Headache.  Pressure.  MRI HEAD WITHOUT CONTRAST  Technique:  Multiplanar, multiecho pulse sequences of the brain and surrounding structures were obtained according to standard protocol without intravenous contrast.  Comparison: Head CT same day  Findings: Diffusion imaging shows acute infarction in a watershed territory affecting the right hemisphere in the posterior frontal to parietal region.  The areas of  infarction show mild swelling but no hemorrhage or mass effect/shift.  There are old infarctions within the cerebellum, right more than left.  No focal brain stem insult.  The cerebral hemispheres otherwise show an old infarction affecting the corpus callosum and pericallosal white matter to the left of midline.  There are some chronic small vessel changes in the hemispheric deep white matter. No mass lesion, hemorrhage or obstructive hydrocephalus.  No pituitary mass.  Sinuses, middle ears and mastoids are clear except for mucosal thickening affecting the sphenoid sinus.  IMPRESSION: Acute infarction in a watershed distribution of the right posterior frontal and parietal region.  Mild swelling but no hemorrhage or mass effect/shift.  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   Original Report Authenticated By: Thomasenia Sales,  M.D.     Microbiology: No results found for this or any previous visit (from the past 240 hour(s)).   Labs: Basic Metabolic Panel:  Lab 01/18/12 1610 01/18/12 1722  NA 144 139  K 3.6 3.7  CL 107 103  CO2 -- 26  GLUCOSE 76 77  BUN 16 15  CREATININE 1.20 1.09  CALCIUM -- 9.9  MG -- --  PHOS -- --   Liver Function Tests:  Lab 01/18/12 1722  AST 17  ALT 11  ALKPHOS 72  BILITOT 0.3  PROT 6.8  ALBUMIN 4.0   No results found for this basename: LIPASE:5,AMYLASE:5 in the last 168 hours No results found for this basename: AMMONIA:5 in the last 168 hours CBC:  Lab 01/18/12 1802 01/18/12 1722  WBC -- 8.1  NEUTROABS -- 4.1  HGB 13.6 12.8*  HCT 40.0 37.0*  MCV -- 87.7  PLT -- 221   Cardiac Enzymes:  Lab 01/18/12 1723  CKTOTAL --  CKMB --  CKMBINDEX --  TROPONINI <0.30   BNP: BNP (last 3 results) No results found for this basename: PROBNP:3 in the last 8760 hours CBG:  Lab 01/20/12 1636 01/20/12 1132 01/20/12 0645 01/19/12 2129 01/19/12 1646  GLUCAP 87 81 112* 105* 107*    Time coordinating  discharge: 35 minutes  Signed:  Benjamine Mola, JESSICA  [facility] 01/21/2012, 12:43 PM

## 2012-01-23 ENCOUNTER — Ambulatory Visit: Payer: PRIVATE HEALTH INSURANCE | Attending: Cardiology | Admitting: Rehabilitative and Restorative Service Providers"

## 2012-01-23 DIAGNOSIS — Z5189 Encounter for other specified aftercare: Secondary | ICD-10-CM | POA: Insufficient documentation

## 2012-01-23 DIAGNOSIS — R269 Unspecified abnormalities of gait and mobility: Secondary | ICD-10-CM | POA: Insufficient documentation

## 2012-01-23 DIAGNOSIS — M6281 Muscle weakness (generalized): Secondary | ICD-10-CM | POA: Insufficient documentation

## 2012-01-23 DIAGNOSIS — I69998 Other sequelae following unspecified cerebrovascular disease: Secondary | ICD-10-CM | POA: Insufficient documentation

## 2012-01-24 ENCOUNTER — Ambulatory Visit: Payer: PRIVATE HEALTH INSURANCE | Admitting: Physical Therapy

## 2012-01-24 ENCOUNTER — Ambulatory Visit: Payer: PRIVATE HEALTH INSURANCE | Admitting: *Deleted

## 2012-01-30 ENCOUNTER — Ambulatory Visit: Payer: PRIVATE HEALTH INSURANCE | Admitting: Rehabilitative and Restorative Service Providers"

## 2012-02-03 ENCOUNTER — Ambulatory Visit: Payer: PRIVATE HEALTH INSURANCE | Admitting: Rehabilitative and Restorative Service Providers"

## 2012-11-25 ENCOUNTER — Telehealth: Payer: Self-pay | Admitting: Nurse Practitioner

## 2012-12-02 ENCOUNTER — Ambulatory Visit: Payer: Self-pay | Admitting: Neurology

## 2012-12-17 ENCOUNTER — Ambulatory Visit (INDEPENDENT_AMBULATORY_CARE_PROVIDER_SITE_OTHER): Payer: Self-pay | Admitting: Nurse Practitioner

## 2012-12-17 ENCOUNTER — Encounter: Payer: Self-pay | Admitting: Nurse Practitioner

## 2012-12-17 VITALS — BP 105/61 | HR 77 | Temp 99.3°F | Ht 74.5 in | Wt 187.0 lb

## 2012-12-17 DIAGNOSIS — Z720 Tobacco use: Secondary | ICD-10-CM

## 2012-12-17 DIAGNOSIS — E785 Hyperlipidemia, unspecified: Secondary | ICD-10-CM

## 2012-12-17 DIAGNOSIS — F17200 Nicotine dependence, unspecified, uncomplicated: Secondary | ICD-10-CM

## 2012-12-17 DIAGNOSIS — I639 Cerebral infarction, unspecified: Secondary | ICD-10-CM

## 2012-12-17 DIAGNOSIS — I6350 Cerebral infarction due to unspecified occlusion or stenosis of unspecified cerebral artery: Secondary | ICD-10-CM

## 2012-12-17 NOTE — Patient Instructions (Signed)
Plan:  Continue Plavix for secondary stroke prevention with strict control of diabetes with hemoglobin A1c goal below 6.5%, lipids with LDL cholesterol goal below 70 mg percent.  I have recommended to him to quit smoking.   Return for followup in 6 months or call earlier if necessary.

## 2012-12-17 NOTE — Progress Notes (Signed)
[facility]  PATIENT: Elijah Sandoval DOB: 1951/04/12   HISTORY FROM: patient, chart REASON FOR VISIT: routine follow up  HISTORY OF PRESENT ILLNESS:  60 year  have returned American male who is seen for first office followup following hospital admission on 01/18/12 for stroke. He presented  With sudden onset of left-sided weakness dizziness and gait imbalance. His symptoms improved quickly upon arrival and hence he was not a candidate for TPA. CT scan on admission was unremarkable but subsequent MRI scan showed a small right frontoparietal infarct. MRA of the brain and neck showed no significant intracranial or extracranial stenosis. Transthoracic echo showed normal ejection fraction. Cardiac monitoring did not show any atrial fibrillation. Lipid profile showed total cholesterol 212, triglycerides 90, HDL 38 and LDL 156 mg percent. Hb A1c was 6.4%. He was started on clopidogrel for stroke prevention as well as smoking cessation counseling. He is tolerating Lipitor 40 mg daily without significant my areas and states his sugars are well controlled on metformin find a milligrams twice a day. He still feels slightly off balance particularly when he stands up or moves suddenly. He has no other complaints.nted with sudden onset of left-sided weakness and dizziness without loss of consciousness. His symptoms improved but if was still slightly off balance up at the time of presentation and hence he was not a candidate for TPA. CT of head was unremarkable but subsequent MRI scan of the brain showed a small right posterior frontal retinal infarct. Transthoracic echo showed normal ejection fraction. MRA of the brain and neck showed no significant intracranial or Extracranial stenosis.  UPDATE 12/17/12 (LL): Patient returns for stroke follow up, stroke was 1 year ago yesterday.  He has done well, no lasting neurovascular deficits.  Has had left epi-gastric region pain for a few months, Dr. Sharyn Lull  started Pepcid.  Reports dizziness sometimes when he stands up.  Tolerating Plavix well, patient denies medication side effects, with no signs of bleeding or excessive bruising.  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  States he was no able to get the outpatient telemetry monitoring ordered last visit due to his insurance not covering the procedure.  REVIEW OF SYSTEMS: Full 14 system review of systems performed and notable only for: constitutional: N/A  cardiovascular: chest (rib) pain respiratory: N/A Eyes: blurred vision endocrine: N/A  ear/nose/throat: N/A  Hematology/Lymph: easy bleeding, easy bruising musculoskeletal: N/A skin: N/A genitourinary: N/A Gastrointestinal: N/A allergy/immunology: N/A neurological: N/A sleep: N/A psychiatric: N/A   ALLERGIES: No Known Allergies  HOME MEDICATIONS: Outpatient Prescriptions Prior to Visit  Medication Sig Dispense Refill  . atorvastatin (LIPITOR) 40 MG tablet Take 1 tablet (40 mg total) by mouth daily at 6 PM.  30 tablet  0  . clopidogrel (PLAVIX) 75 MG tablet Take 1 tablet (75 mg total) by mouth daily with breakfast.  30 tablet  0  . metFORMIN (GLUCOPHAGE) 500 MG tablet Take 500 mg by mouth 2 (two) times daily with a meal.       No facility-administered medications prior to visit.    PAST MEDICAL HISTORY: Past Medical History  Diagnosis Date  . Diabetes mellitus     PAST SURGICAL HISTORY: Past Surgical History  Procedure Laterality Date  . No past surgeries      FAMILY HISTORY: Family History  Problem Relation Age of Onset  . Cancer Sister   . Cancer Sister     SOCIAL HISTORY: History   Social History  . Marital Status: Married    Spouse  Name: N/A    Number of Children: 3  . Years of Education: N/A   Occupational History  . self employed    Social History Main Topics  . Smoking status: Current Every Day Smoker -- 0.25 packs/day for 15 years    Types: Cigarettes  . Smokeless tobacco: Never Used  . Alcohol Use:  No  . Drug Use: No  . Sexual Activity: Not on file   Other Topics Concern  . Not on file   Social History Narrative  . No narrative on file     PHYSICAL EXAM  Filed Vitals:   12/17/12 1543  BP: 105/61  Pulse: 77  Temp: 99.3 F (37.4 C)  TempSrc: Oral  Height: 6' 2.5" (1.892 m)  Weight: 187 lb (84.823 kg)   Body mass index is 23.7 kg/(m^2).  Physical Exam  General: Pleasant, in no distress.  Afebrile.   Head: nontraumatic Ears, Nose and Throat: Hearing is normal.  Neck: supple without bruit Respiratory: clear to auscultation Cardiovascular: no murmur or gallop Musculoskeletal: no deformity Skin: no rash  Neurologic Exam  Mental Status: Awake, alert and oriented to time, place and person.  Speech and language appear normal.   Cranial Nerves: Eye movements are full range without nystagmus.  Fundi revealed sharp disc margins without papilledema.  Visual fields are full to confrontational testing.  Face is symmetric without weakness.  Tongue is midline. Hearing is normal. Motor: reveals no upper or lower extremity drift.  Symmetric and equal strength in all four extremities.  No focal weakness. Slightly diminished fine finger movements on the left. Orbits right over left approximately. Sensory: Touch and pinprick sensations are normal.   Coordination: normal. no  finger to nose dysmetria Gait and Station: steady gait  but mild difficulty with tandem walking Reflexes: Deep tendon reflexes are 2+ symmetric.  Plantars are downgoing.     DIAGNOSTIC DATA (LABS, IMAGING, TESTING) - I reviewed patient records, labs, notes, testing and imaging myself where available.  Lab Results  Component Value Date   WBC 8.1 01/18/2012   HGB 13.6 01/18/2012   HCT 40.0 01/18/2012   MCV 87.7 01/18/2012   PLT 221 01/18/2012      Component Value Date/Time   NA 144 01/18/2012 1802   K 3.6 01/18/2012 1802   CL 107 01/18/2012 1802   CO2 26 01/18/2012 1722   GLUCOSE 76 01/18/2012 1802   BUN 16  01/18/2012 1802   CREATININE 1.20 01/18/2012 1802   CALCIUM 9.9 01/18/2012 1722   PROT 6.8 01/18/2012 1722   ALBUMIN 4.0 01/18/2012 1722   AST 17 01/18/2012 1722   ALT 11 01/18/2012 1722   ALKPHOS 72 01/18/2012 1722   BILITOT 0.3 01/18/2012 1722   GFRNONAA 72* 01/18/2012 1722   GFRAA 83* 01/18/2012 1722   Lab Results  Component Value Date   CHOL 212* 01/20/2012   HDL 38* 01/20/2012   LDLCALC 156* 01/20/2012   TRIG 90 01/20/2012   CHOLHDL 5.6 01/20/2012   Lab Results  Component Value Date   HGBA1C 6.4* 01/20/2012   01/19/12 MRA NECK WITHOUT AND WITH CONTRAST  No anterior circulation disease noted in the neck.  No significant posterior circulation disease in the neck. 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  01/19/12 MRA HEAD WITHOUT CONTRAST  Normal intracranial MR angiography of the large and medium-sized vessels. 01/18/12 MRI HEAD WITHOUT CONTRAST  Acute infarction in a watershed distribution of the right posterior frontal and parietal region. Mild swelling but  no hemorrhage or mass effect/shift. 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 Old infarction of the corpus callosum  and pericallosal white matter. 01/19/12 2D ECHO Systolic function was normal. The estimated ejection fraction was in the range of 50% to 55%.  No cardiac source of embolism was identified 01/20/12 Carotid Duplex Doppler Bilateral: mild soft plaque origin ICA. No significant ICA stenosis. Vertebral artery flow is antegrade.   ASSESSMENT AND PLAN 61 year African American male with small right posterior frontal and parietal embolic infarct without definite identified source of embolism in October 2013 who presented with mild left hemiparesis and ataxia who has shown significant clinical improvement. Vascular risk factors of diabetes hyperlipidemia and smoking.  Plan:  Continue Plavix for secondary stroke prevention with strict control of diabetes with hemoglobin A1c goal below 6.5%, lipids with LDL cholesterol goal below 100  mg percent.  Routine labs done in Dr. Annitta Jersey office I have recommended to him to quit smoking.  Return for followup in 6 months or call earlier if necessary.  Lashonda Sonneborn NP-C 12/17/2012, 4:31 PM  [facility] [address], [address] [place], [place] [address] [phone]

## 2013-06-17 ENCOUNTER — Ambulatory Visit: Payer: Self-pay | Admitting: Nurse Practitioner

## 2013-07-20 NOTE — Telephone Encounter (Signed)
Closing encounter

## 2013-09-29 ENCOUNTER — Ambulatory Visit (INDEPENDENT_AMBULATORY_CARE_PROVIDER_SITE_OTHER): Payer: Self-pay | Admitting: Nurse Practitioner

## 2013-09-29 ENCOUNTER — Encounter (INDEPENDENT_AMBULATORY_CARE_PROVIDER_SITE_OTHER): Payer: Self-pay

## 2013-09-29 ENCOUNTER — Encounter: Payer: Self-pay | Admitting: Nurse Practitioner

## 2013-09-29 VITALS — BP 133/68 | HR 96 | Temp 98.8°F | Ht 75.0 in | Wt 189.0 lb

## 2013-09-29 DIAGNOSIS — I6350 Cerebral infarction due to unspecified occlusion or stenosis of unspecified cerebral artery: Secondary | ICD-10-CM

## 2013-09-29 DIAGNOSIS — I639 Cerebral infarction, unspecified: Secondary | ICD-10-CM

## 2013-09-29 MED ORDER — CLOPIDOGREL BISULFATE 75 MG PO TABS: 75.0000 mg | ORAL_TABLET | Freq: Every day | ORAL | Status: DC

## 2013-09-29 NOTE — Progress Notes (Signed)
PATIENT: Elijah Sandoval DOB: 1950/11/01  REASON FOR VISIT: routine follow up for stroke HISTORY FROM: patient  HISTORY OF PRESENT ILLNESS: 63 year old African American male who is seen for first office followup following hospital admission on 01/18/12 for stroke. He presented With sudden onset of left-sided weakness dizziness and gait imbalance. His symptoms improved quickly upon arrival and hence he was not a candidate for TPA. CT scan on admission was unremarkable but subsequent MRI scan showed a small right frontoparietal infarct. MRA of the brain and neck showed no significant intracranial or extracranial stenosis. Transthoracic echo showed normal ejection fraction. Cardiac monitoring did not show any atrial fibrillation. Lipid profile showed total cholesterol 212, triglycerides 90, HDL 38 and LDL 156 mg percent. Hb A1c was 6.4%. He was started on clopidogrel for stroke prevention as well as smoking cessation counseling. He is tolerating Lipitor 40 mg daily without significant my areas and states his sugars are well controlled on metformin find a milligrams twice a day. He still feels slightly off balance particularly when he stands up or moves suddenly. He has no other complaints.nted with sudden onset of left-sided weakness and dizziness without loss of consciousness. His symptoms improved but if was still slightly off balance up at the time of presentation and hence he was not a candidate for TPA. CT of head was unremarkable but subsequent MRI scan of the brain showed a small right posterior frontal retinal infarct. Transthoracic echo showed normal ejection fraction. MRA of the brain and neck showed no significant intracranial or Extracranial stenosis.  UPDATE 12/17/12 (LL): Patient returns for stroke follow up, stroke was 1 year ago yesterday. He has done well, no lasting neurovascular deficits. Has had left epi-gastric region pain for a few months, Dr. Terrence Dupont started Pepcid. Reports dizziness  sometimes when he stands up. Tolerating Plavix well, patient denies medication side effects, with no signs of bleeding or excessive bruising. Continues to work as a Curator. States he was no able to get the outpatient telemetry monitoring ordered last visit due to his insurance not covering the procedure.   UPDATE 09/29/13 (LL): Since last visit, no new stroke symptoms.  Blood pressure is well controlled, it is 133/68 in the office today.  He is tolerating Plavix well with no signs of significant bleeding or bruising.  Still having epigastric pain, he states Pepcid has not been much of a help.  He states he is due for a visit with Dr. Terrence Dupont and will discuss it then. He reports that his blood sugars have been under good control.  He continues to smoke. No new complaints.  REVIEW OF SYSTEMS: Full 14 system review of systems performed and notable only for: chest tightness, back pain, restless legs  ALLERGIES: No Known Allergies  HOME MEDICATIONS: Outpatient Prescriptions Prior to Visit  Medication Sig Dispense Refill  . atorvastatin (LIPITOR) 40 MG tablet Take 1 tablet (40 mg total) by mouth daily at 6 PM.  30 tablet  0  . famotidine (PEPCID) 20 MG tablet Take 20 mg by mouth 2 (two) times daily.      . metFORMIN (GLUCOPHAGE) 500 MG tablet Take 500 mg by mouth 2 (two) times daily with a meal.      . clopidogrel (PLAVIX) 75 MG tablet Take 1 tablet (75 mg total) by mouth daily with breakfast.  30 tablet  0   No facility-administered medications prior to visit.   PHYSICAL EXAM  Filed Vitals:   09/29/13 1521  BP: 133/68  Pulse: 96  Temp: 98.8 F (37.1 C)  TempSrc: Oral  Height: 6\' 3"  (1.905 m)  Weight: 189 lb (85.73 kg)   Body mass index is 23.62 kg/(m^2). No exam data present   Physical Exam  General: Pleasant, in no distress. Afebrile.  Head: nontraumatic  Ears, Nose and Throat: Hearing is normal.  Neck: supple without bruit  Respiratory: clear to auscultation  Cardiovascular: no  murmur or gallop  Musculoskeletal: no deformity  Skin: no rash   Neurologic Exam  Mental Status: Awake, alert and oriented to time, place and person. Speech and language appear normal.  Cranial Nerves: Eye movements are full range without nystagmus. Fundi revealed sharp disc margins without papilledema. Visual fields are full to confrontational testing. Face is symmetric without weakness. Tongue is midline. Hearing is normal.  Motor: reveals no upper or lower extremity drift. Symmetric and equal strength in all four extremities. No focal weakness. Slightly diminished fine finger movements on the left. Orbits right over left approximately.  Sensory: Touch and pinprick sensations are normal.  Coordination: normal. no finger to nose dysmetria  Gait and Station: steady gait but mild difficulty with tandem walking  Reflexes: Deep tendon reflexes are 2+ symmetric. Plantars are downgoing.   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 Vascular risk factors of diabetes hyperlipidemia and smoking.   Plan: Continue Plavix for secondary stroke prevention with strict control of diabetes with hemoglobin A1c goal below 6.5%, lipids with LDL cholesterol goal below 100 mg percent.  Routine labs done in Dr. Zenia Resides office.  I have recommended to him to quit smoking.  Return for followup in 12 months or call earlier if necessary.  Meds ordered this encounter  Medications  . clopidogrel (PLAVIX) 75 MG tablet    Sig: Take 1 tablet (75 mg total) by mouth daily with breakfast.    Dispense:  90 tablet    Refill:  3    Order Specific Question:  Supervising Provider    Answer:  Garvin Fila [[id number]]   Return in about 1 year (around 09/30/2014) for stroke follow up.  Philmore Pali,  MSN, NP-C 09/29/2013, 3:56 PM [facility] [address], [place], [place] [address] [phone]  Note: This document was prepared with digital dictation and possible smart phrase technology. Any transcriptional errors that result from this process are unintentional.

## 2013-09-29 NOTE — Patient Instructions (Signed)
Plan: Continue Plavix for secondary stroke prevention with strict control of diabetes with hemoglobin A1c goal below 6.5%, lipids with LDL cholesterol goal below 100 mg percent.  Routine labs done in Dr. Zenia Resides office.  I have recommended to him to quit smoking.  Return for followup in 12 months or call earlier if necessary.  Stroke Prevention Some medical conditions and behaviors are associated with an increased chance of having a stroke. You may prevent a stroke by making healthy choices and managing medical conditions. HOW CAN I REDUCE MY RISK OF HAVING A STROKE?   Stay physically active. Get at least 30 minutes of activity on most or all days.  Do not smoke. It may also be helpful to avoid exposure to secondhand smoke.  Limit alcohol use. Moderate alcohol use is considered to be:  No more than 2 drinks per day for men.  No more than 1 drink per day for nonpregnant women.  Eat healthy foods. This involves  Eating 5 or more servings of fruits and vegetables a day.  Following a diet that addresses high blood pressure (hypertension), high cholesterol, diabetes, or obesity.  Manage your cholesterol levels.  A diet low in saturated fat, trans fat, and cholesterol and high in fiber may control cholesterol levels.  Take any prescribed medicines to control cholesterol as directed by your health care provider.  Manage your diabetes.  A controlled-carbohydrate, controlled-sugar diet is recommended to manage diabetes.  Take any prescribed medicines to control diabetes as directed by your health care provider.  Control your hypertension.  A low-salt (sodium), low-saturated fat, low-trans fat, and low-cholesterol diet is recommended to manage hypertension.  Take any prescribed medicines to control hypertension as directed by your health care provider.  Maintain a healthy weight.  A reduced-calorie, low-sodium, low-saturated fat, low-trans fat, low-cholesterol diet is recommended  to manage weight.  Stop drug abuse.  Avoid taking birth control pills.  Talk to your health care provider about the risks of taking birth control pills if you are over 77 years old, smoke, get migraines, or have ever had a blood clot.  Get evaluated for sleep disorders (sleep apnea).  Talk to your health care provider about getting a sleep evaluation if you snore a lot or have excessive sleepiness.  Take medicines as directed by your health care provider.  For some people, aspirin or blood thinners (anticoagulants) are helpful in reducing the risk of forming abnormal blood clots that can lead to stroke. If you have the irregular heart rhythm of atrial fibrillation, you should be on a blood thinner unless there is a good reason you cannot take them.  Understand all your medicine instructions.  Make sure that other other conditions (such as anemia or atherosclerosis) are addressed. SEEK IMMEDIATE MEDICAL CARE IF:   You have sudden weakness or numbness of the face, arm, or leg, especially on one side of the body.  Your face or eyelid droops to one side.  You have sudden confusion.  You have trouble speaking (aphasia) or understanding.  You have sudden trouble seeing in one or both eyes.  You have sudden trouble walking.  You have dizziness.  You have a loss of balance or coordination.  You have a sudden, severe headache with no known cause.  You have new chest pain or an irregular heartbeat. Any of these symptoms may represent a serious problem that is an emergency. Do not wait to see if the symptoms will go away. Get medical help at once. Call  your local emergency services  (911 in [place] Do not drive yourself to the hospital. Document Released: 05/09/2004 Document Revised: 01/20/2013 Document Reviewed: 10/02/2012 [facility] Patient Information 2015 [place], [place]. This information is not intended to replace advice given to you by your health care provider. Make sure you  discuss any questions you have with your health care provider.

## 2013-09-30 NOTE — Progress Notes (Signed)
I agree with above 

## 2014-03-02 ENCOUNTER — Encounter: Payer: Self-pay | Admitting: Neurology

## 2014-08-01 ENCOUNTER — Telehealth: Payer: Self-pay | Admitting: *Deleted

## 2014-08-01 NOTE — Telephone Encounter (Signed)
Called and left a message for the pt, informing him that Dr. Leonie Man would not be in the office 10/03/14 and he would need to reschedule. When he calls back, please reschedule him

## 2014-10-03 ENCOUNTER — Ambulatory Visit: Payer: Self-pay | Admitting: Nurse Practitioner

## 2014-10-03 ENCOUNTER — Ambulatory Visit: Payer: Self-pay | Admitting: Neurology

## 2014-11-02 ENCOUNTER — Ambulatory Visit (INDEPENDENT_AMBULATORY_CARE_PROVIDER_SITE_OTHER): Payer: Self-pay | Admitting: Neurology

## 2014-11-02 ENCOUNTER — Encounter: Payer: Self-pay | Admitting: Neurology

## 2014-11-02 VITALS — BP 160/78 | HR 79 | Ht 75.0 in | Wt 190.0 lb

## 2014-11-02 DIAGNOSIS — I6529 Occlusion and stenosis of unspecified carotid artery: Secondary | ICD-10-CM

## 2014-11-02 NOTE — Progress Notes (Signed)
PATIENT: Elijah Sandoval DOB: Jan 06, 1951  REASON FOR VISIT: routine follow up for stroke HISTORY FROM: patient  HISTORY OF PRESENT ILLNESS: 64 year old African American male who is seen for first office followup following hospital admission on 01/18/12 for stroke. He presented With sudden onset of left-sided weakness dizziness and gait imbalance. His symptoms improved quickly upon arrival and hence he was not a candidate for TPA. CT scan on admission was unremarkable but subsequent MRI scan showed a small right frontoparietal infarct. MRA of the brain and neck showed no significant intracranial or extracranial stenosis. Transthoracic echo showed normal ejection fraction. Cardiac monitoring did not show any atrial fibrillation. Lipid profile showed total cholesterol 212, triglycerides 90, HDL 38 and LDL 156 mg percent. Hb A1c was 6.4%. He was started on clopidogrel for stroke prevention as well as smoking cessation counseling. He is tolerating Lipitor 40 mg daily without significant my areas and states his sugars are well controlled on metformin find a milligrams twice a day. He still feels slightly off balance particularly when he stands up or moves suddenly. He has no other complaints.nted with sudden onset of left-sided weakness and dizziness without loss of consciousness. His symptoms improved but if was still slightly off balance up at the time of presentation and hence he was not a candidate for TPA. CT of head was unremarkable but subsequent MRI scan of the brain showed a small right posterior frontal retinal infarct. Transthoracic echo showed normal ejection fraction. MRA of the brain and neck showed no significant intracranial or Extracranial stenosis.  UPDATE 12/17/12 (LL): Patient returns for stroke follow up, stroke was 1 year ago yesterday. He has done well, no lasting neurovascular deficits. Has had left epi-gastric region pain for a few months, Dr. Terrence Dupont started Pepcid. Reports dizziness  sometimes when he stands up. Tolerating Plavix well, patient denies medication side effects, with no signs of bleeding or excessive bruising. Continues to work as a Curator. States he was no able to get the outpatient telemetry monitoring ordered last visit due to his insurance not covering the procedure.   UPDATE 09/29/13 (LL): Since last visit, no new stroke symptoms.  Blood pressure is well controlled, it is 133/68 in the office today.  He is tolerating Plavix well with no signs of significant bleeding or bruising.  Still having epigastric pain, he states Pepcid has not been much of a help.  He states he is due for a visit with Dr. Terrence Dupont and will discuss it then. He reports that his blood sugars have been under good control.  He continues to smoke. No new complaints. Update 11/02/2014 : He returns for follow-up after last visit 1 year ago. He continues to do well without recurrent stroke or TIA symptoms. He remains on Plavix which is tolerating well without bleeding or bruising. He is also on Lipitor and does not have significant arthralgias or myalgias. His blood pressure is usually well controlled though it is slightly elevated at 160/78 in office today. He plans to have lipid profile checked Upcoming visit with primary care physician Dr Cherlynn June REVIEW OF SYSTEMS: Full 14 system review of systems performed and notable only for: No complaints today. ALLERGIES: No Known Allergies  HOME MEDICATIONS: Outpatient Prescriptions Prior to Visit  Medication Sig Dispense Refill  . atorvastatin (LIPITOR) 40 MG tablet Take 1 tablet (40 mg total) by mouth daily at 6 PM. 30 tablet 0  . clopidogrel (PLAVIX) 75 MG tablet Take 1 tablet (75 mg total) by mouth  daily with breakfast. 90 tablet 3  . metFORMIN (GLUCOPHAGE) 500 MG tablet Take 500 mg by mouth 2 (two) times daily with a meal.    . famotidine (PEPCID) 20 MG tablet Take 20 mg by mouth 2 (two) times daily.     No facility-administered medications prior to  visit.   PHYSICAL EXAM  Filed Vitals:   11/02/14 0833  BP: 160/78  Pulse: 79  Height: 6\' 3"  (1.905 m)  Weight: 190 lb (86.183 kg)   Body mass index is 23.75 kg/(m^2). No exam data present   Physical Exam  General: Pleasant, in no distress. Afebrile.  Head: nontraumatic  Ears, Nose and Throat: Hearing is normal.  Neck: supple without bruit  Respiratory: clear to auscultation  Cardiovascular: no murmur or gallop  Musculoskeletal: no deformity  Skin: no rash   Neurologic Exam  Mental Status: Awake, alert and oriented to time, place and person. Speech and language appear normal.  Cranial Nerves: Eye movements are full range without nystagmus. Fundi revealed sharp disc margins without papilledema. Visual fields are full to confrontational testing. Face is symmetric without weakness. Tongue is midline. Hearing is normal.  Motor: reveals no upper or lower extremity drift. Symmetric and equal strength in all four extremities. No focal weakness. Slightly diminished fine finger movements on the left. Orbits right over left approximately.  Sensory: Touch and pinprick sensations are normal.  Coordination: normal. no finger to nose dysmetria  Gait and Station: steady gait but mild difficulty with tandem walking  Reflexes: Deep tendon reflexes are 2+ symmetric. Plantars are downgoing.   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 Vascular risk factors of diabetes hyperlipidemia and smoking.   Plan:I had a long d/w patient about his remote stroke, risk for recurrent stroke/TIAs, personally independently reviewed imaging studies and stroke evaluation results and answered questions.Continue  Plavix  for secondary stroke prevention and maintain strict control of  hypertension with blood pressure goal below 130/90, diabetes with hemoglobin A1c goal below 6.5% and lipids with LDL cholesterol goal below 100 mg/dL. I also advised the patient to eat a healthy diet with plenty of whole grains, cereals, fruits and vegetables, exercise regularly and maintain ideal body weight .Check follow-up screening carotid ultrasound study. Check fasting lipid profile and hemoglobin A1c at next primary care physician visit. Followup in the future with me only as needed and no routine appointment is necessary. .  No orders of the defined types were placed in this encounter.   Return if symptoms worsen or fail to improve.  Antony Contras, MD  11/02/2014, 5:34 PM [facility] [address], [place], [place] [address] [phone]  Note: This document was prepared with digital dictation and possible smart phrase technology. Any transcriptional errors that result from this process are unintentional.

## 2014-11-02 NOTE — Patient Instructions (Signed)
I had a long d/w patient about his remote stroke, risk for recurrent stroke/TIAs, personally independently reviewed imaging studies and stroke evaluation results and answered questions.Continue  Plavix  for secondary stroke prevention and maintain strict control of hypertension with blood pressure goal below 130/90, diabetes with hemoglobin A1c goal below 6.5% and lipids with LDL cholesterol goal below 100 mg/dL. I also advised the patient to eat a healthy diet with plenty of whole grains, cereals, fruits and vegetables, exercise regularly and maintain ideal body weight .Check follow-up screening carotid ultrasound study. Followup in the future with me only as needed and no routine appointment is necessary.

## 2015-04-19 ENCOUNTER — Emergency Department (HOSPITAL_COMMUNITY)
Admission: EM | Admit: 2015-04-19 | Discharge: 2015-04-19 | Disposition: A | Discharge status: Home / Self Care | Payer: Self-pay | Attending: Emergency Medicine | Admitting: Emergency Medicine

## 2015-04-19 ENCOUNTER — Encounter (HOSPITAL_COMMUNITY): Payer: Self-pay

## 2015-04-19 ENCOUNTER — Emergency Department (HOSPITAL_COMMUNITY): Payer: PRIVATE HEALTH INSURANCE

## 2015-04-19 DIAGNOSIS — Z8673 Personal history of transient ischemic attack (TIA), and cerebral infarction without residual deficits: Secondary | ICD-10-CM | POA: Insufficient documentation

## 2015-04-19 DIAGNOSIS — Z7902 Long term (current) use of antithrombotics/antiplatelets: Secondary | ICD-10-CM | POA: Insufficient documentation

## 2015-04-19 DIAGNOSIS — F17210 Nicotine dependence, cigarettes, uncomplicated: Secondary | ICD-10-CM | POA: Insufficient documentation

## 2015-04-19 DIAGNOSIS — R42 Dizziness and giddiness: Secondary | ICD-10-CM | POA: Insufficient documentation

## 2015-04-19 DIAGNOSIS — E119 Type 2 diabetes mellitus without complications: Secondary | ICD-10-CM | POA: Insufficient documentation

## 2015-04-19 DIAGNOSIS — R110 Nausea: Secondary | ICD-10-CM | POA: Insufficient documentation

## 2015-04-19 DIAGNOSIS — R55 Syncope and collapse: Secondary | ICD-10-CM | POA: Insufficient documentation

## 2015-04-19 DIAGNOSIS — H5500 Unspecified nystagmus: Secondary | ICD-10-CM | POA: Insufficient documentation

## 2015-04-19 DIAGNOSIS — Z79899 Other long term (current) drug therapy: Secondary | ICD-10-CM | POA: Insufficient documentation

## 2015-04-19 DIAGNOSIS — N390 Urinary tract infection, site not specified: Secondary | ICD-10-CM | POA: Insufficient documentation

## 2015-04-19 DIAGNOSIS — R61 Generalized hyperhidrosis: Secondary | ICD-10-CM | POA: Insufficient documentation

## 2015-04-19 DIAGNOSIS — Z7984 Long term (current) use of oral hypoglycemic drugs: Secondary | ICD-10-CM | POA: Insufficient documentation

## 2015-04-19 HISTORY — DX: Cerebral infarction, unspecified: I63.9

## 2015-04-19 LAB — BASIC METABOLIC PANEL
Anion gap: 10 (ref 5–15)
BUN: 15 mg/dL (ref 6–20)
CHLORIDE: 106 mmol/L (ref 101–111)
CO2: 24 mmol/L (ref 22–32)
Calcium: 9.5 mg/dL (ref 8.9–10.3)
Creatinine, Ser: 1.15 mg/dL (ref 0.61–1.24)
GFR calc Af Amer: >60 mL/min (ref >60)
GFR calc non Af Amer: >60 mL/min (ref >60)
GLUCOSE: 188 mg/dL — AB (ref 65–99)
POTASSIUM: 3.8 mmol/L (ref 3.5–5.1)
Sodium: 140 mmol/L (ref 135–145)

## 2015-04-19 LAB — URINALYSIS, ROUTINE W REFLEX MICROSCOPIC
GLUCOSE, UA: 100 mg/dL — AB
Hgb urine dipstick: NEGATIVE
KETONES UR: NEGATIVE mg/dL
Leukocytes, UA: NEGATIVE
Nitrite: NEGATIVE
PH: 5.5 (ref 5.0–8.0)
Protein, ur: 30 mg/dL — AB
Specific Gravity, Urine: 1.025 (ref 1.005–1.030)

## 2015-04-19 LAB — CBG MONITORING, ED: Glucose-Capillary: 166 mg/dL — ABNORMAL HIGH (ref 65–99)

## 2015-04-19 LAB — CBC
HEMATOCRIT: 34.1 % — AB (ref 39.0–52.0)
Hemoglobin: 10.4 g/dL — ABNORMAL LOW (ref 13.0–17.0)
MCH: 21.5 pg — AB (ref 26.0–34.0)
MCHC: 30.5 g/dL (ref 30.0–36.0)
MCV: 70.5 fL — AB (ref 78.0–100.0)
Platelets: 328 10*3/uL (ref 150–400)
RBC: 4.84 MIL/uL (ref 4.22–5.81)
RDW: 18.7 % — AB (ref 11.5–15.5)
WBC: 7.6 10*3/uL (ref 4.0–10.5)

## 2015-04-19 LAB — URINE MICROSCOPIC-ADD ON
RBC / HPF: NONE SEEN RBC/hpf (ref 0–5)
Squamous Epithelial / LPF: NONE SEEN

## 2015-04-19 MED ORDER — CEPHALEXIN 500 MG PO CAPS: 500.0000 mg | ORAL_CAPSULE | Freq: Three times a day (TID) | ORAL | Status: DC

## 2015-04-19 NOTE — Discharge Instructions (Signed)

## 2015-04-19 NOTE — ED Notes (Signed)
Pt has not returned from MRI 

## 2015-04-19 NOTE — ED Notes (Signed)
Per PT, Patient woke up and went into the kitchen to make coffee. While making coffee, patient became dizzy, diaphoretic and nauseous. Pt reports trying to sit down and then "found myself in the floor." Pt denies hitting his head and when asked if he lost consciousness stated, "I remember hearing things during the whole process." Reports his grandson came in and helped him off the floor after hearing the fall. Denies any pain at the time or now. Denies any vomiting or diarrhea. Pt report symptoms resolved after about eight minutes.

## 2015-04-19 NOTE — ED Notes (Signed)
CBG [id number] 

## 2015-04-19 NOTE — ED Notes (Signed)
MD at the bedside  

## 2015-04-19 NOTE — ED Provider Notes (Addendum)
CSN: [id number]     Arrival date & time 04/19/15  0719 History   First MD Initiated Contact with Patient 04/19/15 0720     Chief Complaint  Patient presents with  . Dizziness  . Loss of Consciousness     (Consider location/radiation/quality/duration/timing/severity/associated sxs/prior Treatment) Patient is a 65 y.o. male presenting with dizziness and syncope. The history is provided by the patient.  Dizziness Quality:  Lightheadedness (sweatiness) Severity:  Moderate Onset quality:  Gradual Duration: 7-8 minutes yesterday. Timing:  Rare Progression:  Resolved Chronicity:  New Context comment:  Was making coffee, felt light-headed, sat down, briefly lost consciousness, fully resolved with some residual "feels like I'm going to fall over forward" Relieved by:  Nothing Worsened by:  Nothing Associated symptoms: syncope   Loss of Consciousness Associated symptoms: dizziness     Past Medical History  Diagnosis Date  . Diabetes mellitus   . Stroke [facility])     Pt reports TIA in 2013   Past Surgical History  Procedure Laterality Date  . No past surgeries     Family History  Problem Relation Age of Onset  . Cancer Sister   . Cancer Sister    Social History  Substance Use Topics  . Smoking status: Current Every Day Smoker -- 0.25 packs/day for 15 years    Types: Cigarettes  . Smokeless tobacco: Never Used  . Alcohol Use: No    Review of Systems  Cardiovascular: Positive for syncope.  Neurological: Positive for dizziness.  All other systems reviewed and are negative.     Allergies  Review of patient's allergies indicates no known allergies.  Home Medications   Prior to Admission medications   Medication Sig Start Date End Date Taking? Authorizing Provider  atorvastatin (LIPITOR) 40 MG tablet Take 1 tablet (40 mg total) by mouth daily at 6 PM. 01/20/12   Geradine Girt, DO  clopidogrel (PLAVIX) 75 MG tablet Take 1 tablet (75 mg total) by mouth daily with breakfast.  09/29/13   Philmore Pali, NP  metFORMIN (GLUCOPHAGE) 500 MG tablet Take 500 mg by mouth 2 (two) times daily with a meal.    Historical Provider, MD   BP 140/85 mmHg  Pulse 82  Temp(Src) 97.9 F (36.6 C) (Oral)  Resp 15  SpO2 100% Physical Exam  Constitutional: He is oriented to person, place, and time. He appears well-developed and well-nourished. No distress.  HENT:  Head: Normocephalic and atraumatic.  Eyes: Conjunctivae are normal.  Neck: Neck supple. No tracheal deviation present.  Cardiovascular: Normal rate, regular rhythm and normal heart sounds.   Pulmonary/Chest: Effort normal and breath sounds normal. No respiratory distress.  Abdominal: Soft. He exhibits no distension. There is no tenderness.  Neurological: He is alert and oriented to person, place, and time. No cranial nerve deficit. Coordination normal.  Sustained left-sided nystagmus with head impulse. Normal finger to nose testing and rapid alternating movement   Skin: Skin is warm and dry.  Psychiatric: He has a normal mood and affect.  Vitals reviewed.   ED Course  Procedures (including critical care time) Labs Review Labs Reviewed  CBC - Abnormal; Notable for the following:    Hemoglobin 10.4 (*)    HCT 34.1 (*)    MCV 70.5 (*)    MCH 21.5 (*)    RDW 18.7 (*)    All other components within normal limits  BASIC METABOLIC PANEL  URINALYSIS, ROUTINE W REFLEX MICROSCOPIC (NOT AT [facility])  CBG MONITORING, ED  Imaging Review Mr Herby Abraham Contrast  04/19/2015  CLINICAL DATA:  65 year old male with acute onset dizziness, diaphoresis, nausea wall making coffee this morning. Subsequently fell, found down. Initial encounter. EXAM: MRI HEAD WITHOUT CONTRAST TECHNIQUE: Multiplanar, multiecho pulse sequences of the brain and surrounding structures were obtained without intravenous contrast. COMPARISON:  Brain MRI.  Head and neck MRA 01/19/2012. FINDINGS: Major intracranial vascular flow voids are stable, with mild generalized  intracranial artery dolichoectasia. No restricted diffusion to suggest acute infarction. No midline shift, mass effect, evidence of mass lesion, ventriculomegaly, extra-axial collection or acute intracranial hemorrhage. Cervicomedullary junction and pituitary are within normal limits. Negative visualized cervical spine. Expected evolution of the 2013 right ACA infarct, now with patchy right ACA territory encephalomalacia. Chronic left ACA territory encephalomalacia with significant involvement of the anterior body of the corpus callosum as before (series 2, image 12). Stable right greater than left chronic cerebellar infarcts since that time. Mild left MCA white matter T2 and FLAIR hyperintensity is stable and nonspecific. No other cortical encephalomalacia. Small chronic micro hemorrhage in the cerebellar vermis is stable (series 8, image 34). Deep gray matter nuclei and brainstem remain normal. Visible internal auditory structures appear normal. Mastoids are clear. New paranasal sinus inflammation with bilateral mucosal thickening and maxillary sinus fluid. Seen in 2013 demonstrates expected evolution with patchy right ACA territory encephalomalacia. Negative orbit and scalp soft tissues. Normal bone marrow signal. IMPRESSION: 1.  No acute intracranial abnormality. 2. 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 Electronically Signed   By: Genevie Ann M.D.   On: 04/19/2015 10:22   I have personally reviewed and evaluated these images and lab results as part of my medical decision-making.   EKG Interpretation   Date/Time:  Wednesday April 19 2015 07:29:49 EST Ventricular Rate:  81 PR Interval:  153 QRS Duration: 80 QT Interval:  382 QTC Calculation: 443 R Axis:   80 Text Interpretation:  Sinus rhythm Consider left ventricular hypertrophy  Baseline wander in lead(s) V6 No significant change since last tracing  Confirmed by Malaiah Viramontes  MD, Quillian Quince [id number]) on 04/19/2015 7:36:57 AM      MDM   Final diagnoses:  Dizziness  Urinary tract infection without hematuria, site unspecified    65 y.o. male history of cerebellar stroke presents with onset of dizziness yesterday with diaphoresis and nausea, persisted today and feels like his head is moving forward. He lost consciousness during the incident yesterday and is improved today. Has some mild left-sided sustained nystagmus on head impulse testing but is not acutely symptomatic with head position. Due to difficulty with neurologic examination and previous history, MR was ordered to rule out acute posterior circulation stroke. No acute abnormality was found, urine is suspicious for possible infection with 6-30 white blood cells and bacteria. Low risk syncope episode by [facility] criteria. Empiric coverage for urinary tract infection was provided pending culture. No significant hematologic or metabolic abnormalities to explain symptoms. Plan to follow up with PCP as needed and return precautions discussed for worsening or new concerning symptoms.      Leo Grosser, MD 04/20/15 [phone]

## 2015-04-20 LAB — URINE CULTURE: CULTURE: NO GROWTH

## 2015-12-22 ENCOUNTER — Other Ambulatory Visit: Payer: Self-pay | Admitting: Cardiology

## 2015-12-22 DIAGNOSIS — R079 Chest pain, unspecified: Secondary | ICD-10-CM

## 2015-12-26 ENCOUNTER — Ambulatory Visit (HOSPITAL_COMMUNITY): Payer: Self-pay

## 2015-12-26 ENCOUNTER — Encounter (HOSPITAL_COMMUNITY): Payer: Self-pay

## 2018-03-18 ENCOUNTER — Other Ambulatory Visit: Payer: Self-pay | Admitting: Cardiology

## 2018-03-18 DIAGNOSIS — R079 Chest pain, unspecified: Secondary | ICD-10-CM

## 2018-04-03 ENCOUNTER — Encounter (HOSPITAL_COMMUNITY)
Admission: RE | Admit: 2018-04-03 | Discharge: 2018-04-03 | Disposition: A | Discharge status: Home / Self Care | Payer: Medicare Other | Source: Ambulatory Visit | Attending: Cardiology | Admitting: Cardiology

## 2018-04-03 DIAGNOSIS — R079 Chest pain, unspecified: Secondary | ICD-10-CM | POA: Diagnosis present

## 2018-04-03 LAB — LIPID PANEL
CHOLESTEROL: 168 mg/dL (ref 0–200)
HDL: 35 mg/dL — ABNORMAL LOW (ref >40)
LDL Cholesterol: 117 mg/dL — ABNORMAL HIGH (ref 0–99)
TRIGLYCERIDES: 78 mg/dL (ref <150)
Total CHOL/HDL Ratio: 4.8 RATIO
VLDL: 16 mg/dL (ref 0–40)

## 2018-04-03 LAB — HEPATIC FUNCTION PANEL
ALT: 20 U/L (ref 0–44)
AST: 22 U/L (ref 15–41)
Albumin: 4 g/dL (ref 3.5–5.0)
Alkaline Phosphatase: 73 U/L (ref 38–126)
TOTAL PROTEIN: 7 g/dL (ref 6.5–8.1)
Total Bilirubin: 0.4 mg/dL (ref 0.3–1.2)

## 2018-04-03 LAB — BASIC METABOLIC PANEL
Anion gap: 9 (ref 5–15)
BUN: 11 mg/dL (ref 8–23)
CALCIUM: 9.7 mg/dL (ref 8.9–10.3)
CO2: 24 mmol/L (ref 22–32)
CREATININE: 1.14 mg/dL (ref 0.61–1.24)
Chloride: 106 mmol/L (ref 98–111)
GFR calc Af Amer: >60 mL/min (ref >60)
GLUCOSE: 321 mg/dL — AB (ref 70–99)
Potassium: 4.7 mmol/L (ref 3.5–5.1)
SODIUM: 139 mmol/L (ref 135–145)

## 2018-04-03 LAB — HEMOGLOBIN A1C
HEMOGLOBIN A1C: 9 % — AB (ref 4.8–5.6)
MEAN PLASMA GLUCOSE: 211.6 mg/dL

## 2018-04-03 MED ORDER — TECHNETIUM TC 99M TETROFOSMIN IV KIT
10.0000 | PACK | Freq: Once | INTRAVENOUS | Status: AC | PRN
  Administered 2018-04-03: 10 via INTRAVENOUS

## 2018-04-03 MED ORDER — REGADENOSON 0.4 MG/5ML IV SOLN
INTRAVENOUS | Status: AC
  Filled 2018-04-03: qty 5

## 2018-04-03 MED ORDER — REGADENOSON 0.4 MG/5ML IV SOLN
0.4000 mg | Freq: Once | INTRAVENOUS | Status: AC
  Administered 2018-04-03: 0.4 mg via INTRAVENOUS

## 2018-04-03 NOTE — Progress Notes (Signed)
Lexiscan complete.  No CP or tightness

## 2018-04-03 NOTE — Progress Notes (Signed)
Little tightness in chest.  Dr. Terrence Dupont remains at bedside

## 2018-04-03 NOTE — Progress Notes (Signed)
Lexiscan given at 1030

## 2019-02-25 ENCOUNTER — Observation Stay (HOSPITAL_COMMUNITY)
Admission: EM | Admit: 2019-02-25 | Discharge: 2019-02-26 | Disposition: A | Discharge status: Home / Self Care | Payer: Medicare Other | Attending: Internal Medicine | Admitting: Internal Medicine

## 2019-02-25 ENCOUNTER — Observation Stay (HOSPITAL_COMMUNITY): Payer: Medicare Other

## 2019-02-25 ENCOUNTER — Other Ambulatory Visit: Payer: Self-pay

## 2019-02-25 ENCOUNTER — Encounter (HOSPITAL_COMMUNITY): Payer: Self-pay | Admitting: Emergency Medicine

## 2019-02-25 DIAGNOSIS — Z86010 Personal history of colonic polyps: Secondary | ICD-10-CM | POA: Diagnosis not present

## 2019-02-25 DIAGNOSIS — R911 Solitary pulmonary nodule: Secondary | ICD-10-CM | POA: Diagnosis not present

## 2019-02-25 DIAGNOSIS — Z7984 Long term (current) use of oral hypoglycemic drugs: Secondary | ICD-10-CM | POA: Diagnosis not present

## 2019-02-25 DIAGNOSIS — Z800 Family history of malignant neoplasm of digestive organs: Secondary | ICD-10-CM | POA: Diagnosis not present

## 2019-02-25 DIAGNOSIS — Z7902 Long term (current) use of antithrombotics/antiplatelets: Secondary | ICD-10-CM | POA: Insufficient documentation

## 2019-02-25 DIAGNOSIS — I10 Essential (primary) hypertension: Secondary | ICD-10-CM | POA: Insufficient documentation

## 2019-02-25 DIAGNOSIS — K869 Disease of pancreas, unspecified: Secondary | ICD-10-CM | POA: Diagnosis not present

## 2019-02-25 DIAGNOSIS — Z716 Tobacco abuse counseling: Secondary | ICD-10-CM | POA: Diagnosis not present

## 2019-02-25 DIAGNOSIS — E1165 Type 2 diabetes mellitus with hyperglycemia: Secondary | ICD-10-CM | POA: Diagnosis not present

## 2019-02-25 DIAGNOSIS — Z720 Tobacco use: Secondary | ICD-10-CM | POA: Diagnosis present

## 2019-02-25 DIAGNOSIS — I639 Cerebral infarction, unspecified: Secondary | ICD-10-CM | POA: Insufficient documentation

## 2019-02-25 DIAGNOSIS — Z79899 Other long term (current) drug therapy: Secondary | ICD-10-CM | POA: Insufficient documentation

## 2019-02-25 DIAGNOSIS — Z20828 Contact with and (suspected) exposure to other viral communicable diseases: Secondary | ICD-10-CM | POA: Insufficient documentation

## 2019-02-25 DIAGNOSIS — D649 Anemia, unspecified: Secondary | ICD-10-CM | POA: Diagnosis present

## 2019-02-25 DIAGNOSIS — I700 Atherosclerosis of aorta: Secondary | ICD-10-CM | POA: Diagnosis not present

## 2019-02-25 DIAGNOSIS — R195 Other fecal abnormalities: Secondary | ICD-10-CM | POA: Insufficient documentation

## 2019-02-25 DIAGNOSIS — F17210 Nicotine dependence, cigarettes, uncomplicated: Secondary | ICD-10-CM | POA: Insufficient documentation

## 2019-02-25 DIAGNOSIS — Z7982 Long term (current) use of aspirin: Secondary | ICD-10-CM | POA: Insufficient documentation

## 2019-02-25 DIAGNOSIS — R634 Abnormal weight loss: Secondary | ICD-10-CM | POA: Diagnosis not present

## 2019-02-25 DIAGNOSIS — R9389 Abnormal findings on diagnostic imaging of other specified body structures: Secondary | ICD-10-CM

## 2019-02-25 DIAGNOSIS — Z8673 Personal history of transient ischemic attack (TIA), and cerebral infarction without residual deficits: Secondary | ICD-10-CM | POA: Insufficient documentation

## 2019-02-25 DIAGNOSIS — E785 Hyperlipidemia, unspecified: Secondary | ICD-10-CM | POA: Insufficient documentation

## 2019-02-25 LAB — COMPREHENSIVE METABOLIC PANEL
ALT: 13 U/L (ref 0–44)
AST: 19 U/L (ref 15–41)
Albumin: 4 g/dL (ref 3.5–5.0)
Alkaline Phosphatase: 65 U/L (ref 38–126)
Anion gap: 14 (ref 5–15)
BUN: 11 mg/dL (ref 8–23)
CO2: 18 mmol/L — ABNORMAL LOW (ref 22–32)
Calcium: 9.6 mg/dL (ref 8.9–10.3)
Chloride: 103 mmol/L (ref 98–111)
Creatinine, Ser: 1.23 mg/dL (ref 0.61–1.24)
GFR calc Af Amer: >60 mL/min (ref >60)
GFR calc non Af Amer: 60 mL/min — ABNORMAL LOW (ref >60)
Glucose, Bld: 296 mg/dL — ABNORMAL HIGH (ref 70–99)
Potassium: 3.6 mmol/L (ref 3.5–5.1)
Sodium: 135 mmol/L (ref 135–145)
Total Bilirubin: 0.8 mg/dL (ref 0.3–1.2)
Total Protein: 7 g/dL (ref 6.5–8.1)

## 2019-02-25 LAB — CBC
HCT: 23.7 % — ABNORMAL LOW (ref 39.0–52.0)
Hemoglobin: 6.2 g/dL — CL (ref 13.0–17.0)
MCH: 17.2 pg — ABNORMAL LOW (ref 26.0–34.0)
MCHC: 26.2 g/dL — ABNORMAL LOW (ref 30.0–36.0)
MCV: 65.7 fL — ABNORMAL LOW (ref 80.0–100.0)
Platelets: 239 10*3/uL (ref 150–400)
RBC: 3.61 MIL/uL — ABNORMAL LOW (ref 4.22–5.81)
RDW: 20.7 % — ABNORMAL HIGH (ref 11.5–15.5)
WBC: 6.2 10*3/uL (ref 4.0–10.5)
nRBC: 0.3 % — ABNORMAL HIGH (ref 0.0–0.2)

## 2019-02-25 LAB — ABO/RH: ABO/RH(D): O NEG

## 2019-02-25 LAB — RETICULOCYTES
Immature Retic Fract: 15.7 % (ref 2.3–15.9)
RBC.: 3.31 MIL/uL — ABNORMAL LOW (ref 4.22–5.81)
Retic Count, Absolute: 50 10*3/uL (ref 19.0–186.0)
Retic Ct Pct: 1.5 % (ref 0.4–3.1)

## 2019-02-25 LAB — PROTIME-INR
INR: 1.1 (ref 0.8–1.2)
Prothrombin Time: 14.4 seconds (ref 11.4–15.2)

## 2019-02-25 LAB — CBG MONITORING, ED: Glucose-Capillary: 174 mg/dL — ABNORMAL HIGH (ref 70–99)

## 2019-02-25 LAB — POC OCCULT BLOOD, ED: Fecal Occult Bld: POSITIVE — AB

## 2019-02-25 LAB — VITAMIN B12: Vitamin B-12: 184 pg/mL (ref 180–914)

## 2019-02-25 LAB — PREPARE RBC (CROSSMATCH)

## 2019-02-25 LAB — SARS CORONAVIRUS 2 (TAT 6-24 HRS): SARS Coronavirus 2: NEGATIVE

## 2019-02-25 LAB — IRON AND TIBC
Iron: 11 ug/dL — ABNORMAL LOW (ref 45–182)
Saturation Ratios: 2 % — ABNORMAL LOW (ref 17.9–39.5)
TIBC: 514 ug/dL — ABNORMAL HIGH (ref 250–450)
UIBC: 503 ug/dL

## 2019-02-25 LAB — FOLATE: Folate: 18.7 ng/mL (ref >5.9)

## 2019-02-25 LAB — APTT: aPTT: <20 seconds — ABNORMAL LOW (ref 24–36)

## 2019-02-25 LAB — FERRITIN: Ferritin: 3 ng/mL — ABNORMAL LOW (ref 24–336)

## 2019-02-25 MED ORDER — PANTOPRAZOLE SODIUM 40 MG PO TBEC
40.0000 mg | DELAYED_RELEASE_TABLET | Freq: Every day | ORAL | Status: DC
  Administered 2019-02-25 – 2019-02-26 (×2): 40 mg via ORAL
  Filled 2019-02-25 (×2): qty 1

## 2019-02-25 MED ORDER — IOHEXOL 300 MG/ML  SOLN
100.0000 mL | Freq: Once | INTRAMUSCULAR | Status: AC | PRN
  Administered 2019-02-25: 17:00:00 100 mL via INTRAVENOUS

## 2019-02-25 MED ORDER — SODIUM CHLORIDE 0.9% IV SOLUTION: Freq: Once | INTRAVENOUS | Status: DC

## 2019-02-25 NOTE — ED Triage Notes (Signed)
Patient states he was sent from PCP after routine check up showed low hemoglobin. Patient c/o feeling dizzy for most of the year and emesis yesterday.

## 2019-02-25 NOTE — H&P (Signed)
History and Physical    Elijah Sandoval [id number] DOB: 09-21-1950 DOA: 02/25/2019  PCP: Charolette Forward, MD , Dustin Folks MD [facility] medicine Patient coming from: home   I have personally briefly reviewed patient's old medical records available.   Chief Complaint: tired , feel funny in my head . Abnormal labs   HPI: Elijah Sandoval is a 68 y.o. male with medical history significant of hypertension, hyperlipidemia, smoker, CVA with no residual deficit, on aspirin and Plavix intermittently for last 7 years presents to the emergency room from primary care physician's office with abnormal lab and low hemoglobin.  According to the patient, he had been feeling tired and fatigue, he had been feeling funny head when he wakes up and starts to walk, symptoms are gradually worsening for about a year now.  He also lost 9 pound weight in last 1 year.  After long time he went to see his primary care physician who did a blood work and found to have low hemoglobin so sent to ER. Patient denies any nausea vomiting.  Appetite is fair.  No abdominal pain.  Consistently taking aspirin Plavix these days.  Denies any hematemesis hemoptysis, cough cold or congestion.  Denies any hematochezia or melena.  No urinary symptoms. ED Course: Hemodynamically stable, electrolytes are normal.  Blood sugars 300.  Hemoglobin 6.2 microcytic.  Iron studies were drawn and patient is started on 2 units of PRBC in the ER.  Will need admission monitoring blood transfusion and investigations.  Review of Systems: all systems are reviewed and pertinent positive as per HPI otherwise rest are negative.    Past Medical History:  Diagnosis Date   Diabetes mellitus    Stroke [facility])    Pt reports TIA in 2013    Past Surgical History:  Procedure Laterality Date   NO PAST SURGERIES       reports that he has been smoking cigarettes. He has a 3.75 pack-year smoking history. He has never used smokeless tobacco. He reports that he does  not drink alcohol or use drugs.  No Known Allergies  Family History  Problem Relation Age of Onset   Cancer Sister    Cancer Sister      Prior to Admission medications   Medication Sig Start Date End Date Taking? Authorizing Provider  atorvastatin (LIPITOR) 40 MG tablet Take 1 tablet (40 mg total) by mouth daily at 6 PM. 01/20/12   Eliseo Squires, Tomi Bamberger, DO  cephALEXin (KEFLEX) 500 MG capsule Take 1 capsule (500 mg total) by mouth 3 (three) times daily. 04/19/15   Leo Grosser, MD  clopidogrel (PLAVIX) 75 MG tablet Take 1 tablet (75 mg total) by mouth daily with breakfast. 09/29/13   Philmore Pali, NP  metFORMIN (GLUCOPHAGE) 500 MG tablet Take 500 mg by mouth 2 (two) times daily with a meal.    [provider]    Physical Exam: Vitals:   02/25/19 1234 02/25/19 1235 02/25/19 1245 02/25/19 1254  BP: (!) 126/59 (!) 126/59 (!) 128/58 (!) 144/64  Pulse: 69 69 69 75  Resp: 18 17 18 18   Temp: 98.7 F (37.1 C)   98.5 F (36.9 C)  TempSrc: Oral     SpO2:  100% 100% 100%    Constitutional: NAD, calm, comfortable Vitals:   02/25/19 1234 02/25/19 1235 02/25/19 1245 02/25/19 1254  BP: (!) 126/59 (!) 126/59 (!) 128/58 (!) 144/64  Pulse: 69 69 69 75  Resp: 18 17 18 18   Temp: 98.7 F (37.1 C)  98.5 F (36.9 C)  TempSrc: Oral     SpO2:  100% 100% 100%   Eyes: PERRL, lids and conjunctivae normal, pale  ENMT: Mucous membranes are moist. Posterior pharynx clear of any exudate or lesions.Normal dentition.  Neck: normal, supple, no masses, no thyromegaly Respiratory: clear to auscultation bilaterally, no wheezing, no crackles. Normal respiratory effort. No accessory muscle use.  Cardiovascular: Regular rate and rhythm, no murmurs / rubs / gallops. No extremity edema. 2+ pedal pulses. No carotid bruits.  Abdomen: no tenderness, no masses palpated. No hepatosplenomegaly. Bowel sounds positive.  Musculoskeletal: no clubbing / cyanosis. No joint deformity upper and lower extremities. Good  ROM, no contractures. Normal muscle tone.  Skin: no rashes, lesions, ulcers. No induration Neurologic: CN 2-12 grossly intact. Sensation intact, DTR normal. Strength 5/5 in all 4.  Psychiatric: Normal judgment and insight. Alert and oriented x 3. Normal mood.  FOBT faintly positive reported by ER physician.   Labs on Admission: I have personally reviewed following labs and imaging studies  CBC: Recent Labs  Lab 02/25/19 1059  WBC 6.2  HGB 6.2*  HCT 23.7*  MCV 65.7*  PLT [id number]   Basic Metabolic Panel: Recent Labs  Lab 02/25/19 1059  NA 135  K 3.6  CL 103  CO2 18*  GLUCOSE 296*  BUN 11  CREATININE 1.23  CALCIUM 9.6   GFR: CrCl cannot be calculated (Unknown ideal weight.). Liver Function Tests: Recent Labs  Lab 02/25/19 1059  AST 19  ALT 13  ALKPHOS 65  BILITOT 0.8  PROT 7.0  ALBUMIN 4.0   No results for input(s): LIPASE, AMYLASE in the last 168 hours. No results for input(s): AMMONIA in the last 168 hours. Coagulation Profile: No results for input(s): INR, PROTIME in the last 168 hours. Cardiac Enzymes: No results for input(s): CKTOTAL, CKMB, CKMBINDEX, TROPONINI in the last 168 hours. BNP (last 3 results) No results for input(s): PROBNP in the last 8760 hours. HbA1C: No results for input(s): HGBA1C in the last 72 hours. CBG: No results for input(s): GLUCAP in the last 168 hours. Lipid Profile: No results for input(s): CHOL, HDL, LDLCALC, TRIG, CHOLHDL, LDLDIRECT in the last 72 hours. Thyroid Function Tests: No results for input(s): TSH, T4TOTAL, FREET4, T3FREE, THYROIDAB in the last 72 hours. Anemia Panel: Recent Labs    02/25/19 1213  RETICCTPCT 1.5   Urine analysis:    Component Value Date/Time   COLORURINE YELLOW 04/19/2015 0752   APPEARANCEUR CLOUDY (A) 04/19/2015 0752   LABSPEC 1.025 04/19/2015 0752   PHURINE 5.5 04/19/2015 0752   GLUCOSEU 100 (A) 04/19/2015 0752   HGBUR NEGATIVE 04/19/2015 0752   BILIRUBINUR SMALL (A) 04/19/2015 0752    KETONESUR NEGATIVE 04/19/2015 0752   PROTEINUR 30 (A) 04/19/2015 0752   NITRITE NEGATIVE 04/19/2015 0752   LEUKOCYTESUR NEGATIVE 04/19/2015 0752    Radiological Exams on Admission: No results found.  EKG: Independently reviewed.  Normal sinus rhythm.  No acute changes.  Assessment/Plan Principal Problem:   Symptomatic anemia Active Problems:   HLD (hyperlipidemia)   Tobacco abuse   Essential hypertension     1.  Symptomatic anemia: Suspect chronic iron deficiency anemia, chronic GI blood loss. No evidence of active bleeding.  Agree with 2 units of PRBC transfusion, iron studies were drawn. Does have severe symptomatic anemia, weight loss, colonoscopy 4 years ago with [facility] was reportedly normal. Patient will need upper GI endoscopy, holding aspirin and Plavix, will discuss with gastroenterology, will keep n.p.o. past midnight. Since  he does not have evidence of active GI bleeding, will hold off on starting PPI to have better endoscopic diagnostic yield.  2.  History of stroke on dual antiplatelet therapy: Unsure whether patient will need both antiplatelet therapy.  Currently on hold.  He is on a statin that he will continue.  Does not have any evidence of neurological deficits.  3.  Hypertension: Blood pressure stable.  Resume home medications.  4.  Type 2 diabetes with hyperglycemia: Poorly controlled.  On Metformin.  Hold.  Keep on SSI.  Will check A1c and suggest modification of therapy.  5.  Smoker: Counseled to quit.  Does not want a nicotine patch.   DVT prophylaxis: SCDs Code Status: Full code Family Communication: None Disposition Plan: Home after hospitalization Consults called: Gastroenterology, [facility] Admission status: Observation telemetry   Barb Merino MD [facility] Pager [phone]

## 2019-02-25 NOTE — Consult Note (Signed)
Referring Provider: Ruth Primary Care Physician:  Charolette Forward, MD Primary Gastroenterologist:  Dr. Sanda Linger is inactive because of unpaid balance  Reason for Consultation: Anemia, fatigue and weakness, weight loss  HPI: Elijah Sandoval is a 68 y.o. male with past medical history of CVA with no residual deficit, on aspirin and Plavix was advised to come to the emergency room because of low hemoglobin.   Upon initial evaluation, he was found to have hemoglobin of 6.2, low MCV, normal LFTs.  GI is consulted for further evaluation.  Patient seen and examined at bedside.  He is complaining of intermittent weakness, fatigue and dizziness for last 1 year.  Denies seeing any blood in the stool or black stool.  Denies change in bowel habits.  Denies abdominal pain, nausea and vomiting.  Denies reflux, dysphagia and odynophagia.  Complaining of 8 to 10 pound weight loss in last 1 year.   Last colonoscopy in September 2015 by Dr. Oletta Lamas showed small tubular adenoma in the descending colon.  Patient with family history of colon cancer in sister before age 56.  Past Medical History:  Diagnosis Date  . Diabetes mellitus   . Stroke [facility])    Pt reports TIA in 2013    Past Surgical History:  Procedure Laterality Date  . NO PAST SURGERIES      Prior to Admission medications   Medication Sig Start Date End Date Taking? Authorizing Provider  atorvastatin (LIPITOR) 40 MG tablet Take 1 tablet (40 mg total) by mouth daily at 6 PM. 01/20/12   Eliseo Squires, Tomi Bamberger, DO  cephALEXin (KEFLEX) 500 MG capsule Take 1 capsule (500 mg total) by mouth 3 (three) times daily. 04/19/15   Leo Grosser, MD  clopidogrel (PLAVIX) 75 MG tablet Take 1 tablet (75 mg total) by mouth daily with breakfast. 09/29/13   Philmore Pali, NP  metFORMIN (GLUCOPHAGE) 500 MG tablet Take 500 mg by mouth 2 (two) times daily with a meal.    [provider]    Scheduled Meds: . sodium chloride   Intravenous Once   Continuous  Infusions: PRN Meds:.  Allergies as of 02/25/2019  . (No Known Allergies)    Family History  Problem Relation Age of Onset  . Cancer Sister   . Cancer Sister     Social History   Socioeconomic History  . Marital status: Married    Spouse name: [name]  . Number of children: 3  . Years of education: HS  . Highest education level: Not on file  Occupational History  . Occupation: self employed    Fish farm manager: [place]  Social Needs  . Financial resource strain: Not on file  . Food insecurity    Worry: Not on file    Inability: Not on file  . Transportation needs    Medical: Not on file    Non-medical: Not on file  Tobacco Use  . Smoking status: Current Every Day Smoker    Packs/day: 0.25    Years: 15.00    Pack years: 3.75    Types: Cigarettes  . Smokeless tobacco: Never Used  Substance and Sexual Activity  . Alcohol use: No  . Drug use: No  . Sexual activity: Not on file  Lifestyle  . Physical activity    Days per week: Not on file    Minutes per session: Not on file  . Stress: Not on file  Relationships  . Social connections    Talks on phone: Not on file  Gets together: Not on file    Attends religious service: Not on file    Active member of club or organization: Not on file    Attends meetings of clubs or organizations: Not on file    Relationship status: Not on file  . Intimate partner violence    Fear of current or ex partner: Not on file    Emotionally abused: Not on file    Physically abused: Not on file    Forced sexual activity: Not on file  Other Topics Concern  . Not on file  Social History Narrative   Patient lives at home with spouse.   Caffeine Use: Coffee on weekends    Review of Systems: Review of Systems  Constitutional: Positive for malaise/fatigue and weight loss. Negative for chills and fever.  HENT: Negative for hearing loss and tinnitus.   Eyes: Negative for blurred vision and double vision.  Respiratory:  Positive for shortness of breath. Negative for cough and hemoptysis.   Cardiovascular: Negative for chest pain and palpitations.  Gastrointestinal: Negative for abdominal pain, blood in stool, constipation, diarrhea, heartburn, melena, nausea and vomiting.  Genitourinary: Negative for dysuria and urgency.  Musculoskeletal: Positive for falls. Negative for myalgias.  Skin: Negative for itching and rash.  Neurological: Positive for dizziness and weakness.  Psychiatric/Behavioral: Negative for depression. The patient is nervous/anxious.     Physical Exam: Vital signs: Vitals:   02/25/19 1254 02/25/19 1300  BP: (!) 144/64 (!) 138/56  Pulse: 75 76  Resp: 18 (!) 24  Temp: 98.5 F (36.9 C)   SpO2: 100%      Physical Exam  Constitutional: He is oriented to person, place, and time. He appears well-developed and well-nourished. No distress.  HENT:  Head: Normocephalic and atraumatic.  Eyes: EOM are normal. No scleral icterus.  Neck: Normal range of motion. Neck supple.  Cardiovascular: Normal rate, regular rhythm and normal heart sounds.  Pulmonary/Chest: Effort normal and breath sounds normal. No respiratory distress.  Anterior exam only  Abdominal: Soft. Bowel sounds are normal. He exhibits no distension. There is no abdominal tenderness. There is no rebound and no guarding.  Musculoskeletal: Normal range of motion.        General: No edema.  Neurological: He is alert and oriented to person, place, and time.  Skin: Skin is warm. No erythema.  Psychiatric: He has a normal mood and affect. Judgment and thought content normal.  Vitals reviewed.   GI:  Lab Results: Recent Labs    02/25/19 1059  WBC 6.2  HGB 6.2*  HCT 23.7*  PLT 239   BMET Recent Labs    02/25/19 1059  NA 135  K 3.6  CL 103  CO2 18*  GLUCOSE 296*  BUN 11  CREATININE 1.23  CALCIUM 9.6   LFT Recent Labs    02/25/19 1059  PROT 7.0  ALBUMIN 4.0  AST 19  ALT 13  ALKPHOS 65  BILITOT 0.8    PT/INR Recent Labs    02/25/19 1213  LABPROT 14.4  INR 1.1     Studies/Results: No results found.  Impression/Plan: -Symptomatic anemia. Hgb 6.2. / -Occult blood positive stool -Weight loss -History of CVA.  On aspirin and Plavix.  Last dose this morning. -Personal history of adenomatous polyp and family history of colon cancer in sister  Recommendations ------------------------ -Monitor H&H.  Transfuse to keep hemoglobin around 7-8. -Patient does not have any life-threatening active bleeding.  He may be having slow blood loss over the  period of last 6 to 12 months. -Check CT abdomen pelvis for weight loss. -Recommend outpatient EGD and colonoscopy after holding Plavix for at least 5 days.  May consider inpatient diagnostic EGD or colonoscopy if needed depending on CT scan finding. -Discussed with admitting team.  GI will follow   LOS: 0 days   Otis Brace  MD, FACP 02/25/2019, 1:28 PM  Contact #  [phone]

## 2019-02-25 NOTE — ED Provider Notes (Signed)
[facility] EMERGENCY DEPARTMENT Provider Note   CSN: [id number] Arrival date & time: 02/25/19  1049     History   Chief Complaint Chief Complaint  Patient presents with  . Abnormal Lab    HPI Elijah Sandoval is a 68 y.o. male presenting for evaluation of lightheadedness.  Patient states over the past several months, he has had worsening feeling of lightheadedness and presyncope.  Patient states his symptoms are present when he is standing up, and worse with exertion.  He denies history of similar.  He saw his primary care doctor who did some blood work and sent him to the ER.  Patient denies known bleeding, denies hematemesis, hematuria, or blood in his stool.  He denies melena or hematochezia.  He has had colonoscopies with [facility], they have been normal.  He is on Plavix.  He denies fevers, chills, chest pain, short of breath, abdominal pain.  Patient states he has vomited x2 over the past several days.  He has a previous history of diabetes and cva, takes medication for both.      HPI  Past Medical History:  Diagnosis Date  . Diabetes mellitus   . Stroke [facility])    Pt reports TIA in 2013    Patient Active Problem List   Diagnosis Date Noted  . Symptomatic anemia 02/25/2019  . HLD (hyperlipidemia) 01/20/2012  . Tobacco abuse 01/20/2012  . CVA (cerebral infarction) 01/19/2012    Past Surgical History:  Procedure Laterality Date  . NO PAST SURGERIES          Home Medications    Prior to Admission medications   Medication Sig Start Date End Date Taking? Authorizing Provider  atorvastatin (LIPITOR) 40 MG tablet Take 1 tablet (40 mg total) by mouth daily at 6 PM. 01/20/12   Eliseo Squires, Tomi Bamberger, DO  cephALEXin (KEFLEX) 500 MG capsule Take 1 capsule (500 mg total) by mouth 3 (three) times daily. 04/19/15   Leo Grosser, MD  clopidogrel (PLAVIX) 75 MG tablet Take 1 tablet (75 mg total) by mouth daily with breakfast. 09/29/13   Philmore Pali, NP  metFORMIN  (GLUCOPHAGE) 500 MG tablet Take 500 mg by mouth 2 (two) times daily with a meal.    [provider]    Family History Family History  Problem Relation Age of Onset  . Cancer Sister   . Cancer Sister     Social History Social History   Tobacco Use  . Smoking status: Current Every Day Smoker    Packs/day: 0.25    Years: 15.00    Pack years: 3.75    Types: Cigarettes  . Smokeless tobacco: Never Used  Substance Use Topics  . Alcohol use: No  . Drug use: No     Allergies   Patient has no known allergies.   Review of Systems Review of Systems  Gastrointestinal: Positive for vomiting.  Neurological: Positive for light-headedness.  All other systems reviewed and are negative.    Physical Exam Updated Vital Signs BP (!) 144/64   Pulse 75   Temp 98.5 F (36.9 C)   Resp 18   SpO2 100%   Physical Exam Vitals signs and nursing note reviewed. Exam conducted with a chaperone present.  Constitutional:      General: He is not in acute distress.    Appearance: He is well-developed.     Comments: Appears nontoxic  HENT:     Head: Normocephalic and atraumatic.  Eyes:  Conjunctiva/sclera: Conjunctivae normal.     Pupils: Pupils are equal, round, and reactive to light.     Comments: Slight pallor of conjunctiva  Neck:     Musculoskeletal: Normal range of motion and neck supple.  Cardiovascular:     Rate and Rhythm: Normal rate and regular rhythm.     Pulses: Normal pulses.  Pulmonary:     Effort: Pulmonary effort is normal. No respiratory distress.     Breath sounds: Normal breath sounds. No wheezing.  Abdominal:     General: There is no distension.     Palpations: Abdomen is soft. There is no mass.     Tenderness: There is no abdominal tenderness. There is no guarding or rebound.     Comments: No tenderness palpation the abdomen.  Soft without rigidity, guarding, distention.  Negative rebound.  Genitourinary:    Comments: No gross blood noted on rectal  exam.  Hemoccult positive. Musculoskeletal: Normal range of motion.  Skin:    General: Skin is warm and dry.     Capillary Refill: Capillary refill takes less than 2 seconds.  Neurological:     Mental Status: He is alert and oriented to person, place, and time.      ED Treatments / Results  Labs (all labs ordered are listed, but only abnormal results are displayed) Labs Reviewed  COMPREHENSIVE METABOLIC PANEL - Abnormal; Notable for the following components:      Result Value   CO2 18 (*)    Glucose, Bld 296 (*)    GFR calc non Af Amer 60 (*)    All other components within normal limits  CBC - Abnormal; Notable for the following components:   RBC 3.61 (*)    Hemoglobin 6.2 (*)    HCT 23.7 (*)    MCV 65.7 (*)    MCH 17.2 (*)    MCHC 26.2 (*)    RDW 20.7 (*)    nRBC 0.3 (*)    All other components within normal limits  POC OCCULT BLOOD, ED - Abnormal; Notable for the following components:   Fecal Occult Bld POSITIVE (*)    All other components within normal limits  SARS CORONAVIRUS 2 (TAT 6-24 HRS)  VITAMIN B12  FOLATE  IRON AND TIBC  FERRITIN  RETICULOCYTES  PROTIME-INR  APTT  TYPE AND SCREEN  PREPARE RBC (CROSSMATCH)  ABO/RH    EKG EKG Interpretation  Date/Time:  Thursday February 25 2019 12:00:04 EST Ventricular Rate:  69 PR Interval:    QRS Duration: 98 QT Interval:  406 QTC Calculation: 435 R Axis:   78 Text Interpretation: Sinus rhythm Atrial premature complex Consider left atrial enlargement Minimal ST depression, inferior leads No STEMI. Similar to prior. Confirmed by Nanda Quinton [phone]) on 02/25/2019 12:11:49 PM   Radiology No results found.  Procedures .Critical Care Performed by: Franchot Heidelberg, PA-C Authorized by: Franchot Heidelberg, PA-C   Critical care provider statement:    Critical care time (minutes):  35   Critical care time was exclusive of:  Separately billable procedures and treating other patients and teaching time    Critical care was necessary to treat or prevent imminent or life-threatening deterioration of the following conditions:  Circulatory failure   Critical care was time spent personally by me on the following activities:  Blood draw for specimens, development of treatment plan with patient or surrogate, evaluation of patient's response to treatment, examination of patient, obtaining history from patient or surrogate, ordering and performing treatments and interventions,  ordering and review of laboratory studies, ordering and review of radiographic studies, pulse oximetry, re-evaluation of patient's condition and review of old charts   I assumed direction of critical care for this patient from another provider in my specialty: no   Comments:     Patient was critically low hemoglobin at 6.2.  Blood transfusion started and pt admitted.    (including critical care time)  Medications Ordered in ED Medications  0.9 %  sodium chloride infusion (Manually program via Guardrails IV Fluids) (has no administration in time range)     Initial Impression / Assessment and Plan / ED Course  I have reviewed the triage vital signs and the nursing notes.  Pertinent labs & imaging results that were available during my care of the patient were reviewed by me and considered in my medical decision making (see chart for details).        Patient presenting for evaluation of presyncopal feeling, especially when standing or with exertion.  Physical exam shows patient appears nontoxic.  He does have slight pallor of the conjunctiva.  Heart rate and blood pressure stable.  No obvious sources of bleeding per patient.  Will obtain Hemoccult to assess for GI bleed.  Labs show critically low hemoglobin at 6.2.  Will start blood transfusion.  Otherwise labs are reassuring.  Hemoccult is positive, consider GI source, especially as patient is on Plavix.  Will call for admission.  Discussed with Dr. Sloan Leiter from [facility], pt to be  admitted.   Final Clinical Impressions(s) / ED Diagnoses   Final diagnoses:  Symptomatic anemia    ED Discharge Orders    None       Franchot Heidelberg, PA-C 02/25/19 1258    Long, Wonda Old[facility], MD 02/26/19 [phone]

## 2019-02-25 NOTE — ED Notes (Signed)
Clear liquid dinner tray ordered 

## 2019-02-25 NOTE — ED Notes (Signed)
ED TO INPATIENT HANDOFF REPORT  ED Nurse Name and Phone #: Jinny Blossom, #[phone]  S Name/Age/Gender Elijah Sandoval 68 y.o. male Room/Bed: H019C/H019C  Code Status   Code Status: Not on file  Home/SNF/Other Home Patient oriented to: self, place, time and situation Is this baseline? Yes   Triage Complete: Triage complete  Chief Complaint blood  Triage Note Patient states he was sent from PCP after routine check up showed low hemoglobin. Patient c/o feeling dizzy for most of the year and emesis yesterday.    Allergies No Known Allergies  Level of Care/Admitting Diagnosis ED Disposition    ED Disposition Condition Comment   Admit  Hospital Area: [facility] [[id number]]  Level of Care: Telemetry Medical [104]  I expect the patient will be discharged within 24 hours: No (not a candidate for 5C-Observation unit)  Covid Evaluation: Asymptomatic Screening Protocol (No Symptoms)  Diagnosis: Symptomatic anemia [id number]  Admitting Physician: Barb Merino [id number]  Attending Physician: Barb Merino [id number]  PT Class (Do Not Modify): Observation [104]  PT Acc Code (Do Not Modify): Observation [10022]       B Medical/Surgery History Past Medical History:  Diagnosis Date  . Diabetes mellitus   . Stroke [facility])    Pt reports TIA in 2013   Past Surgical History:  Procedure Laterality Date  . NO PAST SURGERIES       A IV Location/Drains/Wounds Patient Lines/Drains/Airways Status   Active Line/Drains/Airways    Name:   Placement date:   Placement time:   Site:   Days:   Peripheral IV 02/25/19 Right Antecubital   02/25/19    1212    Antecubital   less than 1   Peripheral IV 02/25/19 Left Antecubital   02/25/19    1212    Antecubital   less than 1          Intake/Output Last 24 hours  Intake/Output Summary (Last 24 hours) at 02/25/2019 2049 Last data filed at 02/25/2019 1850 Gross per 24 hour  Intake 1130 ml  Output -  Net 1130 ml     Labs/Imaging Results for orders placed or performed during the hospital encounter of 02/25/19 (from the past 48 hour(s))  Comprehensive metabolic panel     Status: Abnormal   Collection Time: 02/25/19 10:59 AM  Result Value Ref Range   Sodium 135 135 - 145 mmol/L   Potassium 3.6 3.5 - 5.1 mmol/L   Chloride 103 98 - 111 mmol/L   CO2 18 (L) 22 - 32 mmol/L   Glucose, Bld 296 (H) 70 - 99 mg/dL   BUN 11 8 - 23 mg/dL   Creatinine, Ser 1.23 0.61 - 1.24 mg/dL   Calcium 9.6 8.9 - 10.3 mg/dL   Total Protein 7.0 6.5 - 8.1 g/dL   Albumin 4.0 3.5 - 5.0 g/dL   AST 19 15 - 41 U/L   ALT 13 0 - 44 U/L   Alkaline Phosphatase 65 38 - 126 U/L   Total Bilirubin 0.8 0.3 - 1.2 mg/dL   GFR calc non Af Amer 60 (L) >60 mL/min   GFR calc Af Amer >60 >60 mL/min   Anion gap 14 5 - 15    Comment: Performed at [facility], [address], [place] [address]  CBC     Status: Abnormal   Collection Time: 02/25/19 10:59 AM  Result Value Ref Range   WBC 6.2 4.0 - 10.5 K/uL   RBC 3.61 (L) 4.22 - 5.81  MIL/uL   Hemoglobin 6.2 (LL) 13.0 - 17.0 g/dL    Comment: Reticulocyte Hemoglobin testing may be clinically indicated, consider ordering this additional test [id number] THIS CRITICAL RESULT HAS VERIFIED AND BEEN CALLED TO BARBER,M RN BY AMANDA LEONARD ON 11 12 2020 AT 1149, AND HAS BEEN READ BACK.  REPEATED TO VERIFY CORRECTED ON 11/12 AT 1721: PREVIOUSLY REPORTED AS 6.2 Reticulocyte Hemoglobin testing may be clinically indicated, consider ordering this additional test [id number] THIS CRITICAL RESULT HAS VERIFIED AND BEEN CALLED TO BARBER,M RN BY AMANDA LEONARD ON 11  12 2020 AT 1149, AND HAS BEEN READ BACK.     HCT 23.7 (L) 39.0 - 52.0 %   MCV 65.7 (L) 80.0 - 100.0 fL   MCH 17.2 (L) 26.0 - 34.0 pg   MCHC 26.2 (L) 30.0 - 36.0 g/dL   RDW 20.7 (H) 11.5 - 15.5 %   Platelets 239 150 - 400 K/uL   nRBC 0.3 (H) 0.0 - 0.2 %    Comment: Performed at [facility] [address], [place],  [place] [address]  Type and screen [place]     Status: None (Preliminary result)   Collection Time: 02/25/19 11:00 AM  Result Value Ref Range   ABO/RH(D) O NEG    Antibody Screen NEG    Sample Expiration 02/28/2019,2359    Unit Number [id number]    Blood Component Type RED CELLS,LR    Unit division 00    Status of Unit ISSUED    Transfusion Status OK TO TRANSFUSE    Crossmatch Result      Compatible Performed at [facility], [place] [address], [place], [place] [address]    Unit Number [id number]    Blood Component Type RED CELLS,LR    Unit division 00    Status of Unit ISSUED    Transfusion Status OK TO TRANSFUSE    Crossmatch Result Compatible   ABO/Rh     Status: None   Collection Time: 02/25/19 11:00 AM  Result Value Ref Range   ABO/RH(D)      O NEG Performed at [facility] [address] [place], [place] [address]   Vitamin B12     Status: None   Collection Time: 02/25/19 12:13 PM  Result Value Ref Range   Vitamin B-12 184 180 - 914 pg/mL    Comment: (NOTE) This assay is not validated for testing neonatal or myeloproliferative syndrome specimens for Vitamin B12 levels. Performed at [facility], [place] [address], [place], [place] [address]   Folate     Status: None   Collection Time: 02/25/19 12:13 PM  Result Value Ref Range   Folate 18.7 >5.9 ng/mL    Comment: Performed at [facility] [address], [place], [place] [address]  Iron and TIBC     Status: Abnormal   Collection Time: 02/25/19 12:13 PM  Result Value Ref Range   Iron 11 (L) 45 - 182 ug/dL   TIBC 514 (H) 250 - 450 ug/dL   Saturation Ratios 2 (L) 17.9 - 39.5 %   UIBC 503 ug/dL    Comment: Performed at [facility], [place] [address], [place], [place] [address]  Ferritin     Status: Abnormal   Collection Time: 02/25/19 12:13 PM  Result Value Ref Range   Ferritin 3 (L) 24 - 336 ng/mL    Comment: Performed at [facility], [place] [address]  [place], [place] [address]  Reticulocytes     Status: Abnormal   Collection Time: 02/25/19 12:13 PM  Result Value Ref Range   Retic Ct Pct 1.5 0.4 - 3.1 %   RBC. 3.31 (L) 4.22 - 5.81 MIL/uL   Retic Count, Absolute 50.0 19.0 - 186.0 K/uL   Immature Retic Fract 15.7 2.3 - 15.9 %    Comment: Performed at [facility] [address], [place], [place] [address]  SARS CORONAVIRUS 2 (TAT 6-24 HRS) Nasopharyngeal Nasopharyngeal Swab     Status: None   Collection Time: 02/25/19 12:13 PM   Specimen: Nasopharyngeal Swab  Result Value Ref Range   SARS Coronavirus 2 NEGATIVE NEGATIVE    Comment: (NOTE) SARS-CoV-2 target nucleic acids are NOT DETECTED. The SARS-CoV-2 RNA is generally detectable in upper and lower respiratory specimens during the acute phase of infection. Negative results do not preclude SARS-CoV-2 infection, do not rule out co-infections with other pathogens, and should not be used as the sole basis for treatment or other patient management decisions. Negative results must be combined with clinical observations, patient history, and epidemiological information. The expected result is Negative. Fact Sheet for Patients: SugarRoll.be Fact Sheet for Healthcare Providers: https://www.woods-mathews.com/ This test is not yet approved or cleared by the [place] FDA and  has been authorized for detection and/or diagnosis of SARS-CoV-2 by FDA under an Emergency Use Authorization (EUA). This EUA will remain  in effect (meaning this test can be used) for the duration of the COVID-19 declaration under Section 56 4(b)(1) of the Act, 21 U.S.C. section 360bbb-3(b)(1), unless the authorization is terminated or revoked sooner. Performed at [facility], [place] [address], [place], [place] [address]   Protime-INR     Status: None   Collection Time: 02/25/19 12:13 PM  Result Value Ref Range   Prothrombin Time 14.4 11.4 - 15.2 seconds   INR  1.1 0.8 - 1.2    Comment: (NOTE) INR goal varies based on device and disease states. Performed at [facility], [place] [address], [place], [place] [address]   APTT     Status: Abnormal   Collection Time: 02/25/19 12:13 PM  Result Value Ref Range   aPTT <20 (L) 24 - 36 seconds    Comment: SPECIMEN CHECKED FOR CLOTS REPEATED TO VERIFY Performed at [facility], [place] [address] [place], [place] [address]   POC occult blood, ED Provider will collect     Status: Abnormal   Collection Time: 02/25/19 12:26 PM  Result Value Ref Range   Fecal Occult Bld POSITIVE (A) NEGATIVE  Prepare RBC     Status: None   Collection Time: 02/25/19 12:30 PM  Result Value Ref Range   Order Confirmation      ORDER PROCESSED BY BLOOD BANK Performed at [facility], [place] [address], [place], [place] [address]   CBG monitoring, ED     Status: Abnormal   Collection Time: 02/25/19  5:25 PM  Result Value Ref Range   Glucose-Capillary 174 (H) 70 - 99 mg/dL   Ct Abdomen Pelvis W Contrast  Result Date: 02/25/2019 CLINICAL DATA:  Symptomatic anemia. Unintentional weight loss. Generalized abdominal pain. Clinical concern for colon cancer. EXAM: CT ABDOMEN AND PELVIS WITH CONTRAST TECHNIQUE: Multidetector CT imaging of the abdomen and pelvis was performed using the standard protocol following bolus administration of intravenous contrast. CONTRAST:  128mL OMNIPAQUE IOHEXOL 300 MG/ML  SOLN COMPARISON:  None. FINDINGS: Lower chest: Right middle  lobe 3 mm solid pulmonary nodule along the minor fissure (series 4/image 3). Coronary atherosclerosis. Hepatobiliary: Normal liver size. No liver mass. Normal gallbladder with no radiopaque cholelithiasis. No biliary ductal dilatation. Pancreas: Suggestion of a subtle low-attenuation 1.3 x 1.1 cm uncinate process focus (series 3/image 34). No additional potential pancreatic lesions. No pancreatic duct dilation. Spleen: Normal size. No mass. Adrenals/Urinary Tract: Normal  adrenals. No hydronephrosis. Subcentimeter hypodense renal cortical lesion in the posterior upper right kidney, too small to characterize, requiring no follow-up. Normal bladder. Stomach/Bowel: Normal non-distended stomach. Normal caliber small bowel with no small bowel wall thickening. Normal appendix. Normal large bowel with no diverticulosis, large bowel wall thickening or pericolonic fat stranding. No discrete large bowel mass. Vascular/Lymphatic: Atherosclerotic nonaneurysmal abdominal aorta. Patent portal, splenic, hepatic and renal veins. No pathologically enlarged lymph nodes in the abdomen or pelvis. Reproductive: Normal size prostate with coarse nonspecific internal prostatic calcifications. Other: No pneumoperitoneum, ascites or focal fluid collection. Musculoskeletal: No aggressive appearing focal osseous lesions. Moderate thoracolumbar spondylosis. IMPRESSION: 1. No acute bowel pathology. No discrete mass or wall thickening in the colon. Please note that colon cancer cannot be reliably excluded on the basis of a routine CT study. GI consultation for colonoscopy evaluation strongly advised. 2. Suggestion of a subtle low-attenuation 1.3 cm lesion in the uncinate process. No biliary or pancreatic duct dilation. MRI abdomen without and with IV contrast recommended for further characterization. 3. Tiny 3 mm right middle lobe pulmonary nodule. No follow-up needed if patient is low-risk. Non-contrast chest CT can be considered in 12 months if patient is high-risk. This recommendation follows the consensus statement: Guidelines for Management of Incidental Pulmonary Nodules Detected on CT Images:From the Fleischner Society 2017; published online before print (10.1148/radiol.SG:5268862). 4.  Aortic Atherosclerosis (ICD10-I70.0). Electronically Signed   By: Ilona Sorrel M.D.   On: 02/25/2019 17:07    Pending Labs Unresulted Labs (From admission, onward)    Start     Ordered   02/26/19 0500  CBC  Tomorrow  morning,   R     02/25/19 1409   Signed and Held  Hemoglobin A1c  Once,   R    Comments: To assess prior glycemic control    Signed and Held   Signed and Held  HIV Antibody (routine testing w rflx)  (HIV Antibody (Routine testing w reflex) panel)  Once,   R     Signed and Held   Signed and Held  CBC  Tomorrow morning,   R     Signed and Held          Vitals/Pain Today's Vitals   02/25/19 1900 02/25/19 1915 02/25/19 1945 02/25/19 2046  BP: (!) 156/64 (!) 163/76 (!) 157/76   Pulse: 68 70 69   Resp: (!) 24 20 17    Temp: 98.6 F (37 C)     TempSrc: Oral     SpO2: 100% 100% 100%   PainSc:    0-No pain    Isolation Precautions No active isolations  Medications Medications  0.9 %  sodium chloride infusion (Manually program via Guardrails IV Fluids) (has no administration in time range)  pantoprazole (PROTONIX) EC tablet 40 mg (40 mg Oral Given 02/25/19 1522)  iohexol (OMNIPAQUE) 300 MG/ML solution 100 mL (100 mLs Intravenous Contrast Given 02/25/19 1630)    Mobility walks Moderate fall risk   Focused Assessments     R Recommendations: See Admitting Provider Note  Report given to:   Additional Notes:

## 2019-02-26 ENCOUNTER — Inpatient Hospital Stay (HOSPITAL_COMMUNITY): Payer: Medicare Other

## 2019-02-26 DIAGNOSIS — D649 Anemia, unspecified: Secondary | ICD-10-CM

## 2019-02-26 LAB — CBC
HCT: 29.4 % — ABNORMAL LOW (ref 39.0–52.0)
HCT: 29.9 % — ABNORMAL LOW (ref 39.0–52.0)
Hemoglobin: 8.4 g/dL — ABNORMAL LOW (ref 13.0–17.0)
Hemoglobin: 8.7 g/dL — ABNORMAL LOW (ref 13.0–17.0)
MCH: 20.3 pg — ABNORMAL LOW (ref 26.0–34.0)
MCH: 19.9 pg — ABNORMAL LOW (ref 26.0–34.0)
MCHC: 28.6 g/dL — ABNORMAL LOW (ref 30.0–36.0)
MCHC: 29.1 g/dL — ABNORMAL LOW (ref 30.0–36.0)
MCV: 71 fL — ABNORMAL LOW (ref 80.0–100.0)
MCV: 68.4 fL — ABNORMAL LOW (ref 80.0–100.0)
Platelets: 193 10*3/uL (ref 150–400)
Platelets: 209 10*3/uL (ref 150–400)
RBC: 4.14 MIL/uL — ABNORMAL LOW (ref 4.22–5.81)
RBC: 4.37 MIL/uL (ref 4.22–5.81)
RDW: 22.6 % — ABNORMAL HIGH (ref 11.5–15.5)
RDW: 22.7 % — ABNORMAL HIGH (ref 11.5–15.5)
WBC: 6.7 10*3/uL (ref 4.0–10.5)
WBC: 6.3 10*3/uL (ref 4.0–10.5)
nRBC: 0 % (ref 0.0–0.2)
nRBC: 0 % (ref 0.0–0.2)

## 2019-02-26 LAB — TYPE AND SCREEN
ABO/RH(D): O NEG
Antibody Screen: NEGATIVE
Unit division: 0
Unit division: 0

## 2019-02-26 LAB — BPAM RBC
Blood Product Expiration Date: 202012032359
Blood Product Expiration Date: 202012032359
ISSUE DATE / TIME: 202011121228
ISSUE DATE / TIME: 202011121555
Unit Type and Rh: 9500
Unit Type and Rh: 9500

## 2019-02-26 LAB — HIV ANTIBODY (ROUTINE TESTING W REFLEX): HIV Screen 4th Generation wRfx: NONREACTIVE

## 2019-02-26 LAB — GLUCOSE, CAPILLARY
Glucose-Capillary: 311 mg/dL — ABNORMAL HIGH (ref 70–99)
Glucose-Capillary: 121 mg/dL — ABNORMAL HIGH (ref 70–99)

## 2019-02-26 LAB — HEMOGLOBIN A1C
Hgb A1c MFr Bld: 7.7 % — ABNORMAL HIGH (ref 4.8–5.6)
Mean Plasma Glucose: 174.29 mg/dL

## 2019-02-26 MED ORDER — INSULIN ASPART 100 UNIT/ML ~~LOC~~ SOLN
0.0000 [IU] | Freq: Three times a day (TID) | SUBCUTANEOUS | Status: DC
  Administered 2019-02-26: 7 [IU] via SUBCUTANEOUS
  Administered 2019-02-26: 1 [IU] via SUBCUTANEOUS

## 2019-02-26 MED ORDER — ACETAMINOPHEN 650 MG RE SUPP: 650.0000 mg | Freq: Four times a day (QID) | RECTAL | Status: DC | PRN

## 2019-02-26 MED ORDER — ONDANSETRON HCL 4 MG PO TABS: 4.0000 mg | ORAL_TABLET | Freq: Four times a day (QID) | ORAL | Status: DC | PRN

## 2019-02-26 MED ORDER — ATORVASTATIN CALCIUM 40 MG PO TABS
40.0000 mg | ORAL_TABLET | Freq: Every day | ORAL | Status: DC
  Administered 2019-02-26: 40 mg via ORAL
  Filled 2019-02-26: qty 1

## 2019-02-26 MED ORDER — ACETAMINOPHEN 325 MG PO TABS: 650.0000 mg | ORAL_TABLET | Freq: Four times a day (QID) | ORAL | Status: DC | PRN

## 2019-02-26 MED ORDER — ONDANSETRON HCL 4 MG/2ML IJ SOLN: 4.0000 mg | Freq: Four times a day (QID) | INTRAMUSCULAR | Status: DC | PRN

## 2019-02-26 MED ORDER — GADOBUTROL 1 MMOL/ML IV SOLN
8.0000 mL | Freq: Once | INTRAVENOUS | Status: AC | PRN
  Administered 2019-02-26: 8 mL via INTRAVENOUS

## 2019-02-26 MED ORDER — INSULIN ASPART 100 UNIT/ML ~~LOC~~ SOLN: 0.0000 [IU] | Freq: Every day | SUBCUTANEOUS | Status: DC

## 2019-02-26 MED ORDER — SODIUM CHLORIDE 0.9 % IV SOLN
510.0000 mg | Freq: Once | INTRAVENOUS | Status: AC
  Administered 2019-02-26: 11:00:00 510 mg via INTRAVENOUS
  Filled 2019-02-26: qty 17

## 2019-02-26 NOTE — Discharge Summary (Signed)
Physician Discharge Summary  Elijah Sandoval [id number] DOB: 1950/07/18 DOA: 02/25/2019  PCP: Charolette Forward, MD  Admit date: 02/25/2019 Discharge date: 02/26/2019  Time spent:35 minutes  Recommendations for Outpatient Follow-up:  GI Dr.Brahmbhatt in 2 weeks for EGD and Colonosocopy Pancreatic lesion needs FU and workup as indicated  Discharge Diagnoses:  Principal Problem:   Symptomatic anemia   Pancreatic lesion   H/o CVA   HLD (hyperlipidemia)   Tobacco abuse   Essential hypertension   Discharge Condition: Stable  Diet recommendation: Heart healthy  There were no vitals filed for this visit.  History of present illness:  Elijah Sandoval a 68 y.o.malewith medical history significant ofhypertension, hyperlipidemia, smoker, CVA with no residual deficit, on aspirin and Plavix intermittently for last 7 years presents to the emergency room from primary care physician's office with abnormal lab and low hemoglobin. According to the patient, he had been feeling tired and fatigue, he had been feeling funny head when he wakes up and starts to walk, symptoms are gradually worsening for about a year now. He also lost 9 pound weight in last 1 year. After long time he went to see his primary care physician who did a blood work and found to have low hemoglobin so sent to ER.  Hospital Course:   Severe iron deficiency anemia -Suspect chronic GI blood loss, hemoglobin noted to be 6.7 on admission -Status post 2 units of PRBC, hemoglobin improved to 8.7 -Anemia panel with severe iron deficiency, given IV iron x1 -Gastroenterology Dr. Alessandra Bevels was consulted, recommends outpatient EGD and colonoscopy, personal history of adenomatous polyp and family history of colon cancer -Okay to restart Plavix per GI, [facility] will call the patient next week to schedule his endoscopic studies  Weight loss, abnormal pancreatic lesion on CT -Gastroenterology Dr. Alessandra Bevels recommended  MRI/MRCP to evaluate this lesion prior to discharge home today -On MRI this was felt to be nonspecific lesion along the pancreatic head measures about 1.6 x 1.5cm, could be a small pseudocyst versus intraductal papillary mucinous neoplasm, recommended follow-up pancreatic protocol MRI in 6 months or endoscopic ultrasound  -Follow-up with Dr. Daine Gravel next week to discuss further plan for monitoring this  History of CVA -Plavix held on admission, this will be restarted at discharge per GI, they will hold this after they have scheduled his endoscopies  Type 2 diabetes mellitus -Metformin resumed -hemoglobin A1c is 7  Tobacco abuse -Counseled  Discharge Exam: Vitals:   02/26/19 0816 02/26/19 1301  BP: (!) 164/75 (!) 126/55  Pulse: 63 61  Resp: 16 18  Temp: 98.1 F (36.7 C) 98.6 F (37 C)  SpO2: 100% 95%    General: AAOx3 Cardiovascular: S1S2/RRR Respiratory: CTAB  Discharge Instructions   Discharge Instructions    Diet - low sodium heart healthy   Complete by: As directed    Increase activity slowly   Complete by: As directed      Allergies as of 02/26/2019   No Known Allergies     Medication List    TAKE these medications   atorvastatin 40 MG tablet Commonly known as: LIPITOR Take 1 tablet (40 mg total) by mouth daily at 6 PM. What changed: when to take this   clopidogrel 75 MG tablet Commonly known as: PLAVIX Take 1 tablet (75 mg total) by mouth daily with breakfast.   lisinopril 20 MG tablet Commonly known as: ZESTRIL Take 20 mg by mouth daily.   metFORMIN 500 MG tablet Commonly known as: GLUCOPHAGE Take 500 mg by  mouth 2 (two) times daily with a meal.   metoprolol succinate 100 MG 24 hr tablet Commonly known as: TOPROL-XL Take 100 mg by mouth daily.      No Known Allergies Follow-up Information    Brahmbhatt, Parag, MD. Schedule an appointment as soon as possible for a visit in 2 week(s).   Specialty: Gastroenterology Why: Symptomatic  anemia, needs outpatient EGD and colonoscopy Contact information: [address] [place] [address] [phone]        Charolette Forward, MD. Schedule an appointment as soon as possible for a visit in 1 week(s).   Specialty: Cardiology Contact information: [place] [address] [phone]            The results of significant diagnostics from this hospitalization (including imaging, microbiology, ancillary and laboratory) are listed below for reference.    Significant Diagnostic Studies: Ct Abdomen Pelvis W Contrast  Result Date: 02/25/2019 CLINICAL DATA:  Symptomatic anemia. Unintentional weight loss. Generalized abdominal pain. Clinical concern for colon cancer. EXAM: CT ABDOMEN AND PELVIS WITH CONTRAST TECHNIQUE: Multidetector CT imaging of the abdomen and pelvis was performed using the standard protocol following bolus administration of intravenous contrast. CONTRAST:  167mL OMNIPAQUE IOHEXOL 300 MG/ML  SOLN COMPARISON:  None. FINDINGS: Lower chest: Right middle lobe 3 mm solid pulmonary nodule along the minor fissure (series 4/image 3). Coronary atherosclerosis. Hepatobiliary: Normal liver size. No liver mass. Normal gallbladder with no radiopaque cholelithiasis. No biliary ductal dilatation. Pancreas: Suggestion of a subtle low-attenuation 1.3 x 1.1 cm uncinate process focus (series 3/image 34). No additional potential pancreatic lesions. No pancreatic duct dilation. Spleen: Normal size. No mass. Adrenals/Urinary Tract: Normal adrenals. No hydronephrosis. Subcentimeter hypodense renal cortical lesion in the posterior upper right kidney, too small to characterize, requiring no follow-up. Normal bladder. Stomach/Bowel: Normal non-distended stomach. Normal caliber small bowel with no small bowel wall thickening. Normal appendix. Normal large bowel with no diverticulosis, large bowel wall thickening or pericolonic fat stranding. No discrete  large bowel mass. Vascular/Lymphatic: Atherosclerotic nonaneurysmal abdominal aorta. Patent portal, splenic, hepatic and renal veins. No pathologically enlarged lymph nodes in the abdomen or pelvis. Reproductive: Normal size prostate with coarse nonspecific internal prostatic calcifications. Other: No pneumoperitoneum, ascites or focal fluid collection. Musculoskeletal: No aggressive appearing focal osseous lesions. Moderate thoracolumbar spondylosis. IMPRESSION: 1. No acute bowel pathology. No discrete mass or wall thickening in the colon. Please note that colon cancer cannot be reliably excluded on the basis of a routine CT study. GI consultation for colonoscopy evaluation strongly advised. 2. Suggestion of a subtle low-attenuation 1.3 cm lesion in the uncinate process. No biliary or pancreatic duct dilation. MRI abdomen without and with IV contrast recommended for further characterization. 3. Tiny 3 mm right middle lobe pulmonary nodule. No follow-up needed if patient is low-risk. Non-contrast chest CT can be considered in 12 months if patient is high-risk. This recommendation follows the consensus statement: Guidelines for Management of Incidental Pulmonary Nodules Detected on CT Images:From the Fleischner Society 2017; published online before print (10.1148/radiol.IJ:2314499). 4.  Aortic Atherosclerosis (ICD10-I70.0). Electronically Signed   By: Ilona Sorrel M.D.   On: 02/25/2019 17:07   Mr 3d Recon At Scanner  Result Date: 02/26/2019 CLINICAL DATA:  Pancreatic lesion follow up EXAM: MRI ABDOMEN WITHOUT AND WITH CONTRAST (INCLUDING MRCP) TECHNIQUE: Multiplanar multisequence MR imaging of the abdomen was performed both before and after the administration of intravenous contrast. Heavily T2-weighted images of the biliary and pancreatic ducts were obtained,  and three-dimensional MRCP images were rendered by post processing. CONTRAST:  45mL GADAVIST GADOBUTROL 1 MMOL/ML IV SOLN COMPARISON:  CT scan from  02/25/2019 FINDINGS: Despite efforts by the technologist and patient, motion artifact is present on today's exam and could not be eliminated. This reduces exam sensitivity and specificity. Lower chest: Unremarkable Hepatobiliary: Dropout of signal in the liver and spleen on inphase images and out of phase images, with low T2 signal in the liver and spleen, compatible with hemosiderosis. No appreciable biliary dilatation. Pancreas: The lesion of concern posteriorly along the pancreatic head and uncinate process measures 1.6 by 0.5 by 1.5 cm. I do not observe obvious enhancement although due to the thin flat nature of the lesion, it is blurred from the motion artifact that exists on most series. Spleen:  Unremarkable Adrenals/Urinary Tract:  Unremarkable Stomach/Bowel: Unremarkable Vascular/Lymphatic:  Aortoiliac atherosclerotic vascular disease. Other:  No supplemental non-categorized findings. Musculoskeletal: Lower lumbar degenerative endplate findings. IMPRESSION: 1. Severe motion artifact reduces diagnostic sensitivity and specificity. 2. The lesion along the posterior pancreatic head/uncinate margin measures about 1.6 by 0.5 by 1.5 cm. Possibilities include a postinflammatory lesions such as a small pseudocyst, versus intraductal papillary mucinous neoplasm. Based on patient's age and current guidelines, follow pancreatic protocol MRI is recommended in 6 months time. Alternatively, endoscopic ultrasound could be considered. This recommendation follows ACR consensus guidelines: Management of Incidental Pancreatic Cysts: A White Paper of the ACR Incidental Findings Committee. J Am Coll Radiol [id number]. 3.  Aortic Atherosclerosis (ICD10-I70.0). 4. 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 Electronically Signed   By: Van Clines M.D.   On: 02/26/2019 17:48   Mr Abdomen Mrcp Moise Boring Contast  Result Date: 02/26/2019 CLINICAL DATA:  Pancreatic lesion follow up EXAM: MRI  ABDOMEN WITHOUT AND WITH CONTRAST (INCLUDING MRCP) TECHNIQUE: Multiplanar multisequence MR imaging of the abdomen was performed both before and after the administration of intravenous contrast. Heavily T2-weighted images of the biliary and pancreatic ducts were obtained, and three-dimensional MRCP images were rendered by post processing. CONTRAST:  2mL GADAVIST GADOBUTROL 1 MMOL/ML IV SOLN COMPARISON:  CT scan from 02/25/2019 FINDINGS: Despite efforts by the technologist and patient, motion artifact is present on today's exam and could not be eliminated. This reduces exam sensitivity and specificity. Lower chest: Unremarkable Hepatobiliary: Dropout of signal in the liver and spleen on inphase images and out of phase images, with low T2 signal in the liver and spleen, compatible with hemosiderosis. No appreciable biliary dilatation. Pancreas: The lesion of concern posteriorly along the pancreatic head and uncinate process measures 1.6 by 0.5 by 1.5 cm. I do not observe obvious enhancement although due to the thin flat nature of the lesion, it is blurred from the motion artifact that exists on most series. Spleen:  Unremarkable Adrenals/Urinary Tract:  Unremarkable Stomach/Bowel: Unremarkable Vascular/Lymphatic:  Aortoiliac atherosclerotic vascular disease. Other:  No supplemental non-categorized findings. Musculoskeletal: Lower lumbar degenerative endplate findings. IMPRESSION: 1. Severe motion artifact reduces diagnostic sensitivity and specificity. 2. The lesion along the posterior pancreatic head/uncinate margin measures about 1.6 by 0.5 by 1.5 cm. Possibilities include a postinflammatory lesions such as a small pseudocyst, versus intraductal papillary mucinous neoplasm. Based on patient's age and current guidelines, follow pancreatic protocol MRI is recommended in 6 months time. Alternatively, endoscopic ultrasound could be considered. This recommendation follows ACR consensus guidelines: Management of Incidental  Pancreatic Cysts: A White Paper of the ACR Incidental Findings Committee. J Am Coll Radiol [id number]. 3.  Aortic Atherosclerosis (ICD10-I70.0). 4.  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 Electronically Signed   By: Van Clines M.D.   On: 02/26/2019 17:48    Microbiology: Recent Results (from the past 240 hour(s))  SARS CORONAVIRUS 2 (TAT 6-24 HRS) Nasopharyngeal Nasopharyngeal Swab     Status: None   Collection Time: 02/25/19 12:13 PM   Specimen: Nasopharyngeal Swab  Result Value Ref Range Status   SARS Coronavirus 2 NEGATIVE NEGATIVE Final    Comment: (NOTE) SARS-CoV-2 target nucleic acids are NOT DETECTED. The SARS-CoV-2 RNA is generally detectable in upper and lower respiratory specimens during the acute phase of infection. Negative results do not preclude SARS-CoV-2 infection, do not rule out co-infections with other pathogens, and should not be used as the sole basis for treatment or other patient management decisions. Negative results must be combined with clinical observations, patient history, and epidemiological information. The expected result is Negative. Fact Sheet for Patients: SugarRoll.be Fact Sheet for Healthcare Providers: https://www.woods-mathews.com/ This test is not yet approved or cleared by the [place] FDA and  has been authorized for detection and/or diagnosis of SARS-CoV-2 by FDA under an Emergency Use Authorization (EUA). This EUA will remain  in effect (meaning this test can be used) for the duration of the COVID-19 declaration under Section 56 4(b)(1) of the Act, 21 U.S.C. section 360bbb-3(b)(1), unless the authorization is terminated or revoked sooner. Performed at [facility], [place] [address] [place], [place] [address]      Labs: Basic Metabolic Panel: Recent Labs  Lab 02/25/19 1059  NA 135  K 3.6  CL 103  CO2 18*  GLUCOSE 296*  BUN 11   CREATININE 1.23  CALCIUM 9.6   Liver Function Tests: Recent Labs  Lab 02/25/19 1059  AST 19  ALT 13  ALKPHOS 65  BILITOT 0.8  PROT 7.0  ALBUMIN 4.0   No results for input(s): LIPASE, AMYLASE in the last 168 hours. No results for input(s): AMMONIA in the last 168 hours. CBC: Recent Labs  Lab 02/25/19 1059 02/26/19 0626 02/26/19 0914  WBC 6.2 6.7 6.3  HGB 6.2* 8.4* 8.7*  HCT 23.7* 29.4* 29.9*  MCV 65.7* 71.0* 68.4*  PLT 239 193 209   Cardiac Enzymes: No results for input(s): CKTOTAL, CKMB, CKMBINDEX, TROPONINI in the last 168 hours. BNP: BNP (last 3 results) No results for input(s): BNP in the last 8760 hours.  ProBNP (last 3 results) No results for input(s): PROBNP in the last 8760 hours.  CBG: Recent Labs  Lab 02/25/19 1725 02/26/19 1053 02/26/19 1623  GLUCAP 174* 311* 121*       Signed:  Domenic Polite MD.  [facility] 02/26/2019, 6:14 PM

## 2019-02-26 NOTE — Care Management CC44 (Signed)
Condition Code 44 Documentation Completed  Patient Details  Name: Elijah Sandoval MRN: [id number] Date of Birth: 10/02/1950   Condition Code 44 given:  Yes Patient signature on Condition Code 44 notice:  Yes Documentation of 2 MD's agreement:  Yes Code 44 added to claim:  Yes    Erenest Rasher, RN 02/26/2019, 6:34 PM

## 2019-02-26 NOTE — Discharge Instructions (Signed)
Medicare Outpatient Observation Notice  Patient name: Elijah Sandoval Patient number: [id number]  [facility] a hospital outpatient receiving observation services. You are not an inpatient because: weak, fatigue, blood low, anemia  Patient status is changed from inpatient to observation (outpatient)status as indicated under the Medicare Condition Code-44 Regulations, Chapter 100-04 of the Medicare Claims Processing Manual 50.3. Date of Status Change 02/26/2019 6:28 PM  Being an outpatient may affect what you pay in a hospital:  When youre a hospital outpatient, your observation stay is covered under Medicare Part B. For Part B services, you generally pay: A copayment for each outpatient hospital service you get. Part B copayments may vary by type of service. 20% of the Medicare-approved amount for most doctor services, after the Part B deductible. Observation services may affect coverage and payment of your care after you leave the hospital: If you need skilled nursing facility (SNF) care after you leave the hospital, Medicare Part A will only cover SNF care if youve had a 3-day minimum, medically necessary, inpatient hospital stay for a related illness or injury. An inpatient hospital stay begins the day the hospital admits you as an inpatient based on a doctors order and doesnt include the day youre discharged. If you have Medicaid, a Medicare Advantage plan or other health plan, Medicaid or the plan may have different rules for SNF coverage after you leave the hospital. Check with Medicaid or your plan. NOTE: Medicare Part A generally doesnt cover outpatient hospital services, like an observation stay. However, Part A will generally cover medically necessary inpatient services if the hospital admits you as an inpatient based on a doctors order. In most cases, youll pay a one-time deductible for all of your inpatient hospital services for the first 60 days youre in a hospital.  If you have any  questions about your observation services, ask the hospital staff member giving you this notice or the doctor providing your hospital care. You can also ask to speak with someone from the hospitals utilization or discharge planning department.  You can also call 1-800-MEDICARE (1-9547101962). TTY users should call [phone].  Form CMS [id number]  Expiration 04/14/2021 OMB APPROVAL [id number]  Your costs for medications: Generally, prescription and over-the-counter drugs, including self-administered drugs, you get in a hospital outpatient setting (like an emergency department) arent covered by Part B. Self- administered drugs are drugs youd normally take on your own. For safety reasons, many hospitals dont allow you to take medications brought from home. If you have a Medicare prescription drug plan (Part D), your plan may help you pay for these drugs. Youll likely need to pay out-of- pocket for these drugs and submit a claim to your drug plan for a refund. Contact your drug plan for more information.  If youre enrolled in a Medicare Advantage plan (like an HMO or PPO) or other Medicare health plan (Part C), your costs and coverage may be different. Check with your plan to find out about coverage for outpatient observation services.  If youre a Chief of Staff through your state Medicaid program, you cant be billed for Part A or Part B deductibles, coinsurance, and copayments.  Additional Information (Optional):  Case Manager, [name] RN discussed Code [phone] with patient via phone. Explained to patient that he was changed to observation. Patient verbalized understanding. Unit RN will give patient a copy of Code 44.  Please sign below to show you received and understand this notice. Date: 02/26/19 / Time:6:27 PM  CMS does not discriminate in  its programs and activities. To request this publication in alternative format, please call: 1-800-MEDICARE or  email:[email or web address].  Form CMS [id number]  Expiration 04/14/2021 OMB APPROVAL [id number]  Patient

## 2019-02-26 NOTE — Progress Notes (Addendum)
PROGRESS NOTE    Elijah Sandoval  [id number] DOB: 06/29/1950 DOA: 02/25/2019 PCP: Charolette Forward, MD  Brief Narrative:Elijah Sandoval is a 68 y.o. male with medical history significant of hypertension, hyperlipidemia, smoker, CVA with no residual deficit, on aspirin and Plavix intermittently for last 7 years presents to the emergency room from primary care physician's office with abnormal lab and low hemoglobin.  According to the patient, he had been feeling tired and fatigue, he had been feeling funny head when he wakes up and starts to walk, symptoms are gradually worsening for about a year now.  He also lost 9 pound weight in last 1 year.  After long time he went to see his primary care physician who did a blood work and found to have low hemoglobin so sent to ER.   Assessment & Plan:   Severe iron deficiency anemia -Suspect slow GI blood loss -Status post 2 units of PRBC, hemoglobin improved to 8.7 -Anemia panel with severe iron deficiency, give IV iron x1, monitor CBC closely -Gastroenterology following, recommends outpatient EGD and colonoscopy after plavix wash out, personal history of adenomatous polyp and family history of colon cancer -Okay to restart Plavix per GI  Weight loss, abnormal pancreatic lesion on CT -Gastroenterology following ordered MRI and MRCP to be done today  History of CVA -Aspirin Plavix held on admission will restart Plavix  Type 2 diabetes mellitus -Metformin on hold, sliding scale insulin -hemoglobin A1c is 7  Tobacco abuse -Counseled   DVT prophylaxis: SCDs Code Status: Full code Family Communication: No family at bedside Disposition Plan: Home pending above work-up  Consultants:   Gastroenterology   Procedures:   Antimicrobials:    Subjective: -Complains of headache, breathing better -Denies any bleeding, very poor historian  Objective: Vitals:   02/26/19 0306 02/26/19 0333 02/26/19 0816 02/26/19 1301  BP: (!) 145/49 (!) 124/59  (!) 164/75 (!) 126/55  Pulse: 67 (!) 59 63 61  Resp: 18 14 16 18   Temp:  98.3 F (36.8 C) 98.1 F (36.7 C) 98.6 F (37 C)  TempSrc:  Oral Oral Oral  SpO2: 100% 93% 100% 95%    Intake/Output Summary (Last 24 hours) at 02/26/2019 1438 Last data filed at 02/26/2019 1116 Gross per 24 hour  Intake 870 ml  Output --  Net 870 ml   There were no vitals filed for this visit.  Examination:  General exam: Pleasant male, AAO x3, no distress Respiratory system: Clear to auscultation. Respiratory effort normal. Cardiovascular system: S1 & S2 heard, RRR.   Gastrointestinal system: Abdomen is nondistended, soft and nontender.Normal bowel sounds heard. Central nervous system: Alert and oriented. No focal neurological deficits. Extremities: No edema Skin: No rashes, lesions or ulcers Psychiatry: Appropriate mood and affect    Data Reviewed:   CBC: Recent Labs  Lab 02/25/19 1059 02/26/19 0626 02/26/19 0914  WBC 6.2 6.7 6.3  HGB 6.2* 8.4* 8.7*  HCT 23.7* 29.4* 29.9*  MCV 65.7* 71.0* 68.4*  PLT 239 193 [id number]   Basic Metabolic Panel: Recent Labs  Lab 02/25/19 1059  NA 135  K 3.6  CL 103  CO2 18*  GLUCOSE 296*  BUN 11  CREATININE 1.23  CALCIUM 9.6   GFR: CrCl cannot be calculated (Unknown ideal weight.). Liver Function Tests: Recent Labs  Lab 02/25/19 1059  AST 19  ALT 13  ALKPHOS 65  BILITOT 0.8  PROT 7.0  ALBUMIN 4.0   No results for input(s): LIPASE, AMYLASE in the last 168 hours. No results for  input(s): AMMONIA in the last 168 hours. Coagulation Profile: Recent Labs  Lab 02/25/19 1213  INR 1.1   Cardiac Enzymes: No results for input(s): CKTOTAL, CKMB, CKMBINDEX, TROPONINI in the last 168 hours. BNP (last 3 results) No results for input(s): PROBNP in the last 8760 hours. HbA1C: Recent Labs    02/26/19 0914  HGBA1C 7.7*   CBG: Recent Labs  Lab 02/25/19 1725 02/26/19 1053  GLUCAP 174* 311*   Lipid Profile: No results for input(s): CHOL, HDL,  LDLCALC, TRIG, CHOLHDL, LDLDIRECT in the last 72 hours. Thyroid Function Tests: No results for input(s): TSH, T4TOTAL, FREET4, T3FREE, THYROIDAB in the last 72 hours. Anemia Panel: Recent Labs    02/25/19 1213  VITAMINB12 184  FOLATE 18.7  FERRITIN 3*  TIBC 514*  IRON 11*  RETICCTPCT 1.5   Urine analysis:    Component Value Date/Time   COLORURINE YELLOW 04/19/2015 0752   APPEARANCEUR CLOUDY (A) 04/19/2015 0752   LABSPEC 1.025 04/19/2015 0752   PHURINE 5.5 04/19/2015 0752   GLUCOSEU 100 (A) 04/19/2015 0752   HGBUR NEGATIVE 04/19/2015 0752   BILIRUBINUR SMALL (A) 04/19/2015 0752   KETONESUR NEGATIVE 04/19/2015 0752   PROTEINUR 30 (A) 04/19/2015 0752   NITRITE NEGATIVE 04/19/2015 0752   LEUKOCYTESUR NEGATIVE 04/19/2015 0752   Sepsis Labs: @LABRCNTIP (procalcitonin:4,lacticidven:4)  ) Recent Results (from the past 240 hour(s))  SARS CORONAVIRUS 2 (TAT 6-24 HRS) Nasopharyngeal Nasopharyngeal Swab     Status: None   Collection Time: 02/25/19 12:13 PM   Specimen: Nasopharyngeal Swab  Result Value Ref Range Status   SARS Coronavirus 2 NEGATIVE NEGATIVE Final    Comment: (NOTE) SARS-CoV-2 target nucleic acids are NOT DETECTED. The SARS-CoV-2 RNA is generally detectable in upper and lower respiratory specimens during the acute phase of infection. Negative results do not preclude SARS-CoV-2 infection, do not rule out co-infections with other pathogens, and should not be used as the sole basis for treatment or other patient management decisions. Negative results must be combined with clinical observations, patient history, and epidemiological information. The expected result is Negative. Fact Sheet for Patients: SugarRoll.be Fact Sheet for Healthcare Providers: https://www.woods-mathews.com/ This test is not yet approved or cleared by the [place] FDA and  has been authorized for detection and/or diagnosis of SARS-CoV-2 by FDA  under an Emergency Use Authorization (EUA). This EUA will remain  in effect (meaning this test can be used) for the duration of the COVID-19 declaration under Section 56 4(b)(1) of the Act, 21 U.S.C. section 360bbb-3(b)(1), unless the authorization is terminated or revoked sooner. Performed at [facility], [place] [address], [place], [place] [address]          Radiology Studies: Ct Abdomen Pelvis W Contrast  Result Date: 02/25/2019 CLINICAL DATA:  Symptomatic anemia. Unintentional weight loss. Generalized abdominal pain. Clinical concern for colon cancer. EXAM: CT ABDOMEN AND PELVIS WITH CONTRAST TECHNIQUE: Multidetector CT imaging of the abdomen and pelvis was performed using the standard protocol following bolus administration of intravenous contrast. CONTRAST:  181mL OMNIPAQUE IOHEXOL 300 MG/ML  SOLN COMPARISON:  None. FINDINGS: Lower chest: Right middle lobe 3 mm solid pulmonary nodule along the minor fissure (series 4/image 3). Coronary atherosclerosis. Hepatobiliary: Normal liver size. No liver mass. Normal gallbladder with no radiopaque cholelithiasis. No biliary ductal dilatation. Pancreas: Suggestion of a subtle low-attenuation 1.3 x 1.1 cm uncinate process focus (series 3/image 34). No additional potential pancreatic lesions. No pancreatic duct dilation. Spleen: Normal size. No mass. Adrenals/Urinary Tract: Normal adrenals. No hydronephrosis. Subcentimeter  hypodense renal cortical lesion in the posterior upper right kidney, too small to characterize, requiring no follow-up. Normal bladder. Stomach/Bowel: Normal non-distended stomach. Normal caliber small bowel with no small bowel wall thickening. Normal appendix. Normal large bowel with no diverticulosis, large bowel wall thickening or pericolonic fat stranding. No discrete large bowel mass. Vascular/Lymphatic: Atherosclerotic nonaneurysmal abdominal aorta. Patent portal, splenic, hepatic and renal veins. No pathologically enlarged  lymph nodes in the abdomen or pelvis. Reproductive: Normal size prostate with coarse nonspecific internal prostatic calcifications. Other: No pneumoperitoneum, ascites or focal fluid collection. Musculoskeletal: No aggressive appearing focal osseous lesions. Moderate thoracolumbar spondylosis. IMPRESSION: 1. No acute bowel pathology. No discrete mass or wall thickening in the colon. Please note that colon cancer cannot be reliably excluded on the basis of a routine CT study. GI consultation for colonoscopy evaluation strongly advised. 2. Suggestion of a subtle low-attenuation 1.3 cm lesion in the uncinate process. No biliary or pancreatic duct dilation. MRI abdomen without and with IV contrast recommended for further characterization. 3. Tiny 3 mm right middle lobe pulmonary nodule. No follow-up needed if patient is low-risk. Non-contrast chest CT can be considered in 12 months if patient is high-risk. This recommendation follows the consensus statement: Guidelines for Management of Incidental Pulmonary Nodules Detected on CT Images:From the Fleischner Society 2017; published online before print (10.1148/radiol.IJ:2314499). 4.  Aortic Atherosclerosis (ICD10-I70.0). Electronically Signed   By: Ilona Sorrel M.D.   On: 02/25/2019 17:07        Scheduled Meds:  sodium chloride   Intravenous Once   atorvastatin  40 mg Oral q1800   insulin aspart  0-5 Units Subcutaneous QHS   insulin aspart  0-9 Units Subcutaneous TID WC   pantoprazole  40 mg Oral Daily   Continuous Infusions:   LOS: 0 days    Time spent:59min    Domenic Polite, MD [facility]  02/26/2019, 2:38 PM

## 2019-02-26 NOTE — ED Notes (Signed)
Breakfast ordered 

## 2019-02-26 NOTE — Care Management Obs Status (Signed)
[facility] NOTIFICATION   Patient Details  Name: Elijah Sandoval MRN: [id number] Date of Birth: Jan 23, 1951   Medicare Observation Status Notification Given:  Yes    Erenest Rasher, RN 02/26/2019, 6:34 PM

## 2019-02-26 NOTE — ED Notes (Signed)
ED TO INPATIENT HANDOFF REPORT  ED Nurse Name and Phone #: [phone]  S Name/Age/Gender Elijah Sandoval 68 y.o. male Room/Bed: H018C/H018C  Code Status   Code Status: Not on file  Home/SNF/Other Home Patient oriented to: self, place, time and situation Is this baseline? Yes   Triage Complete: Triage complete  Chief Complaint blood  Triage Note Patient states he was sent from PCP after routine check up showed low hemoglobin. Patient c/o feeling dizzy for most of the year and emesis yesterday.    Allergies No Known Allergies  Level of Care/Admitting Diagnosis ED Disposition    ED Disposition Condition Comment   Admit  Hospital Area: [place] [[id number]]  Level of Care: Telemetry Medical [104]  I expect the patient will be discharged within 24 hours: No (not a candidate for 5C-Observation unit)  Covid Evaluation: Asymptomatic Screening Protocol (No Symptoms)  Diagnosis: Symptomatic anemia [id number]  Admitting Physician: Barb Merino [id number]  Attending Physician: Barb Merino [id number]  PT Class (Do Not Modify): Observation [104]  PT Acc Code (Do Not Modify): Observation [10022]       B Medical/Surgery History Past Medical History:  Diagnosis Date  . Diabetes mellitus   . Stroke [facility])    Pt reports TIA in 2013   Past Surgical History:  Procedure Laterality Date  . NO PAST SURGERIES       A IV Location/Drains/Wounds Patient Lines/Drains/Airways Status   Active Line/Drains/Airways    Name:   Placement date:   Placement time:   Site:   Days:   Peripheral IV 02/25/19 Right Antecubital   02/25/19    1212    Antecubital   1   Peripheral IV 02/25/19 Left Antecubital   02/25/19    1212    Antecubital   1          Intake/Output Last 24 hours  Intake/Output Summary (Last 24 hours) at 02/26/2019 0740 Last data filed at 02/25/2019 1850 Gross per 24 hour  Intake 1130 ml  Output -  Net 1130 ml    Labs/Imaging Results for orders placed  or performed during the hospital encounter of 02/25/19 (from the past 48 hour(s))  Comprehensive metabolic panel     Status: Abnormal   Collection Time: 02/25/19 10:59 AM  Result Value Ref Range   Sodium 135 135 - 145 mmol/L   Potassium 3.6 3.5 - 5.1 mmol/L   Chloride 103 98 - 111 mmol/L   CO2 18 (L) 22 - 32 mmol/L   Glucose, Bld 296 (H) 70 - 99 mg/dL   BUN 11 8 - 23 mg/dL   Creatinine, Ser 1.23 0.61 - 1.24 mg/dL   Calcium 9.6 8.9 - 10.3 mg/dL   Total Protein 7.0 6.5 - 8.1 g/dL   Albumin 4.0 3.5 - 5.0 g/dL   AST 19 15 - 41 U/L   ALT 13 0 - 44 U/L   Alkaline Phosphatase 65 38 - 126 U/L   Total Bilirubin 0.8 0.3 - 1.2 mg/dL   GFR calc non Af Amer 60 (L) >60 mL/min   GFR calc Af Amer >60 >60 mL/min   Anion gap 14 5 - 15    Comment: Performed at [facility], [address], [place] [address]  CBC     Status: Abnormal   Collection Time: 02/25/19 10:59 AM  Result Value Ref Range   WBC 6.2 4.0 - 10.5 K/uL   RBC 3.61 (L) 4.22 - 5.81 MIL/uL   Hemoglobin 6.2 (  LL) 13.0 - 17.0 g/dL    Comment: Reticulocyte Hemoglobin testing may be clinically indicated, consider ordering this additional test [id number] THIS CRITICAL RESULT HAS VERIFIED AND BEEN CALLED TO BARBER,M RN BY AMANDA LEONARD ON 11 12 2020 AT 1149, AND HAS BEEN READ BACK.  REPEATED TO VERIFY CORRECTED ON 11/12 AT 1721: PREVIOUSLY REPORTED AS 6.2 Reticulocyte Hemoglobin testing may be clinically indicated, consider ordering this additional test [id number] THIS CRITICAL RESULT HAS VERIFIED AND BEEN CALLED TO BARBER,M RN BY AMANDA LEONARD ON 11  12 2020 AT 1149, AND HAS BEEN READ BACK.     HCT 23.7 (L) 39.0 - 52.0 %   MCV 65.7 (L) 80.0 - 100.0 fL   MCH 17.2 (L) 26.0 - 34.0 pg   MCHC 26.2 (L) 30.0 - 36.0 g/dL   RDW 20.7 (H) 11.5 - 15.5 %   Platelets 239 150 - 400 K/uL   nRBC 0.3 (H) 0.0 - 0.2 %    Comment: Performed at [facility] [address] [place], [place] [address]  Type and screen [place]     Status: None   Collection Time: 02/25/19 11:00 AM  Result Value Ref Range   ABO/RH(D) O NEG    Antibody Screen NEG    Sample Expiration 02/28/2019,2359    Unit Number [id number]    Blood Component Type RED CELLS,LR    Unit division 00    Status of Unit ISSUED,FINAL    Transfusion Status OK TO TRANSFUSE    Crossmatch Result      Compatible Performed at [facility], [place] [address], [place], [place] [address]    Unit Number [id number]    Blood Component Type RED CELLS,LR    Unit division 00    Status of Unit ISSUED,FINAL    Transfusion Status OK TO TRANSFUSE    Crossmatch Result Compatible   ABO/Rh     Status: None   Collection Time: 02/25/19 11:00 AM  Result Value Ref Range   ABO/RH(D)      O NEG Performed at [facility] [address] [place], [place] [address]   Vitamin B12     Status: None   Collection Time: 02/25/19 12:13 PM  Result Value Ref Range   Vitamin B-12 184 180 - 914 pg/mL    Comment: (NOTE) This assay is not validated for testing neonatal or myeloproliferative syndrome specimens for Vitamin B12 levels. Performed at [facility], [place] [address], [place], [place] [address]   Folate     Status: None   Collection Time: 02/25/19 12:13 PM  Result Value Ref Range   Folate 18.7 >5.9 ng/mL    Comment: Performed at [facility] [address] [place], [place] [address]  Iron and TIBC     Status: Abnormal   Collection Time: 02/25/19 12:13 PM  Result Value Ref Range   Iron 11 (L) 45 - 182 ug/dL   TIBC 514 (H) 250 - 450 ug/dL   Saturation Ratios 2 (L) 17.9 - 39.5 %   UIBC 503 ug/dL    Comment: Performed at [facility], [place] [address], [place], [place] [address]  Ferritin     Status: Abnormal   Collection Time: 02/25/19 12:13 PM  Result Value Ref Range   Ferritin 3 (L) 24 - 336 ng/mL    Comment: Performed at [facility], [place] [address], [place], [place] [address]  Reticulocytes  Status: Abnormal    Collection Time: 02/25/19 12:13 PM  Result Value Ref Range   Retic Ct Pct 1.5 0.4 - 3.1 %   RBC. 3.31 (L) 4.22 - 5.81 MIL/uL   Retic Count, Absolute 50.0 19.0 - 186.0 K/uL   Immature Retic Fract 15.7 2.3 - 15.9 %    Comment: Performed at [facility] [address] [place], [place] [address]  SARS CORONAVIRUS 2 (TAT 6-24 HRS) Nasopharyngeal Nasopharyngeal Swab     Status: None   Collection Time: 02/25/19 12:13 PM   Specimen: Nasopharyngeal Swab  Result Value Ref Range   SARS Coronavirus 2 NEGATIVE NEGATIVE    Comment: (NOTE) SARS-CoV-2 target nucleic acids are NOT DETECTED. The SARS-CoV-2 RNA is generally detectable in upper and lower respiratory specimens during the acute phase of infection. Negative results do not preclude SARS-CoV-2 infection, do not rule out co-infections with other pathogens, and should not be used as the sole basis for treatment or other patient management decisions. Negative results must be combined with clinical observations, patient history, and epidemiological information. The expected result is Negative. Fact Sheet for Patients: SugarRoll.be Fact Sheet for Healthcare Providers: https://www.woods-mathews.com/ This test is not yet approved or cleared by the [place] FDA and  has been authorized for detection and/or diagnosis of SARS-CoV-2 by FDA under an Emergency Use Authorization (EUA). This EUA will remain  in effect (meaning this test can be used) for the duration of the COVID-19 declaration under Section 56 4(b)(1) of the Act, 21 U.S.C. section 360bbb-3(b)(1), unless the authorization is terminated or revoked sooner. Performed at [facility], [place] [address] [place], [place] [address]   Protime-INR     Status: None   Collection Time: 02/25/19 12:13 PM  Result Value Ref Range   Prothrombin Time 14.4 11.4 - 15.2 seconds   INR 1.1 0.8 - 1.2    Comment: (NOTE) INR goal varies based on  device and disease states. Performed at [facility], [place] [address] [place], [place] [address]   APTT     Status: Abnormal   Collection Time: 02/25/19 12:13 PM  Result Value Ref Range   aPTT <20 (L) 24 - 36 seconds    Comment: SPECIMEN CHECKED FOR CLOTS REPEATED TO VERIFY Performed at [facility], [place] [address], [place], [place] [address]   POC occult blood, ED Provider will collect     Status: Abnormal   Collection Time: 02/25/19 12:26 PM  Result Value Ref Range   Fecal Occult Bld POSITIVE (A) NEGATIVE  Prepare RBC     Status: None   Collection Time: 02/25/19 12:30 PM  Result Value Ref Range   Order Confirmation      ORDER PROCESSED BY BLOOD BANK Performed at [facility], [place] [address] [place], [place] [address]   CBG monitoring, ED     Status: Abnormal   Collection Time: 02/25/19  5:25 PM  Result Value Ref Range   Glucose-Capillary 174 (H) 70 - 99 mg/dL  CBC     Status: Abnormal   Collection Time: 02/26/19  6:26 AM  Result Value Ref Range   WBC 6.7 4.0 - 10.5 K/uL   RBC 4.14 (L) 4.22 - 5.81 MIL/uL   Hemoglobin 8.4 (L) 13.0 - 17.0 g/dL    Comment: REPEATED TO VERIFY POST TRANSFUSION SPECIMEN Reticulocyte Hemoglobin testing may be clinically indicated, consider ordering this additional test [id number]    HCT 29.4 (L) 39.0 - 52.0 %  MCV 71.0 (L) 80.0 - 100.0 fL   MCH 20.3 (L) 26.0 - 34.0 pg   MCHC 28.6 (L) 30.0 - 36.0 g/dL   RDW 22.6 (H) 11.5 - 15.5 %   Platelets 193 150 - 400 K/uL   nRBC 0.0 0.0 - 0.2 %    Comment: Performed at [facility] [address] [place], [place] [address]   Ct Abdomen Pelvis W Contrast  Result Date: 02/25/2019 CLINICAL DATA:  Symptomatic anemia. Unintentional weight loss. Generalized abdominal pain. Clinical concern for colon cancer. EXAM: CT ABDOMEN AND PELVIS WITH CONTRAST TECHNIQUE: Multidetector CT imaging of the abdomen and pelvis was performed using the standard protocol following bolus administration  of intravenous contrast. CONTRAST:  134mL OMNIPAQUE IOHEXOL 300 MG/ML  SOLN COMPARISON:  None. FINDINGS: Lower chest: Right middle lobe 3 mm solid pulmonary nodule along the minor fissure (series 4/image 3). Coronary atherosclerosis. Hepatobiliary: Normal liver size. No liver mass. Normal gallbladder with no radiopaque cholelithiasis. No biliary ductal dilatation. Pancreas: Suggestion of a subtle low-attenuation 1.3 x 1.1 cm uncinate process focus (series 3/image 34). No additional potential pancreatic lesions. No pancreatic duct dilation. Spleen: Normal size. No mass. Adrenals/Urinary Tract: Normal adrenals. No hydronephrosis. Subcentimeter hypodense renal cortical lesion in the posterior upper right kidney, too small to characterize, requiring no follow-up. Normal bladder. Stomach/Bowel: Normal non-distended stomach. Normal caliber small bowel with no small bowel wall thickening. Normal appendix. Normal large bowel with no diverticulosis, large bowel wall thickening or pericolonic fat stranding. No discrete large bowel mass. Vascular/Lymphatic: Atherosclerotic nonaneurysmal abdominal aorta. Patent portal, splenic, hepatic and renal veins. No pathologically enlarged lymph nodes in the abdomen or pelvis. Reproductive: Normal size prostate with coarse nonspecific internal prostatic calcifications. Other: No pneumoperitoneum, ascites or focal fluid collection. Musculoskeletal: No aggressive appearing focal osseous lesions. Moderate thoracolumbar spondylosis. IMPRESSION: 1. No acute bowel pathology. No discrete mass or wall thickening in the colon. Please note that colon cancer cannot be reliably excluded on the basis of a routine CT study. GI consultation for colonoscopy evaluation strongly advised. 2. Suggestion of a subtle low-attenuation 1.3 cm lesion in the uncinate process. No biliary or pancreatic duct dilation. MRI abdomen without and with IV contrast recommended for further characterization. 3. Tiny 3 mm right  middle lobe pulmonary nodule. No follow-up needed if patient is low-risk. Non-contrast chest CT can be considered in 12 months if patient is high-risk. This recommendation follows the consensus statement: Guidelines for Management of Incidental Pulmonary Nodules Detected on CT Images:From the Fleischner Society 2017; published online before print (10.1148/radiol.SG:5268862). 4.  Aortic Atherosclerosis (ICD10-I70.0). Electronically Signed   By: Ilona Sorrel M.D.   On: 02/25/2019 17:07    Pending Labs [employer] (From admission, onward)    Start     Ordered   Signed and Held  Hemoglobin A1c  Once,   R    Comments: To assess prior glycemic control    Signed and Held   Signed and Held  HIV Antibody (routine testing w rflx)  (HIV Antibody (Routine testing w reflex) panel)  Once,   R     Signed and Held   Signed and Held  CBC  Tomorrow morning,   R     Signed and Held          Vitals/Pain Today's Vitals   02/25/19 2222 02/26/19 0306 02/26/19 0330 02/26/19 0333  BP:  (!) 145/49  (!) 124/59  Pulse:  67  (!) 59  Resp:  18  14  Temp:    98.3 F (36.8 C)  TempSrc:    Oral  SpO2:  100%  93%  PainSc: 0-No pain  0-No pain     Isolation Precautions No active isolations  Medications Medications  0.9 %  sodium chloride infusion (Manually program via Guardrails IV Fluids) ( Intravenous Not Given 02/25/19 2219)  pantoprazole (PROTONIX) EC tablet 40 mg (40 mg Oral Given 02/25/19 1522)  iohexol (OMNIPAQUE) 300 MG/ML solution 100 mL (100 mLs Intravenous Contrast Given 02/25/19 1630)    Mobility walks Low fall risk   Focused Assessment    R Recommendations: See Admitting Provider Note  Report given to:   Additional Notes:

## 2019-02-26 NOTE — Progress Notes (Signed)
[facility] Gastroenterology Progress Note  Sesar Bossio 68 y.o. 25-Jan-1951  CC:   Symptomatic anemia, weight loss  Subjective: Patient complaining of dizziness but denies any acute GI symptoms.  Denies abdominal pain, nausea or vomiting.  Denies diarrhea or constipation.  Denies any active bleeding.  ROS : Negative for chest pain and shortness of breath   Objective: Vital signs in last 24 hours: Vitals:   02/26/19 0333 02/26/19 0816  BP: (!) 124/59 (!) 164/75  Pulse: (!) 59 63  Resp: 14 16  Temp: 98.3 F (36.8 C) 98.1 F (36.7 C)  SpO2: 93% 100%    Physical Exam:  General.  Well developed, not in acute distress Abdomen.  Soft, nontender, nondistended, bowel sounds present Neuro.  Alert/oriented x3 Psych.  Mood and affect normal  Lab Results: Recent Labs    02/25/19 1059  NA 135  K 3.6  CL 103  CO2 18*  GLUCOSE 296*  BUN 11  CREATININE 1.23  CALCIUM 9.6   Recent Labs    02/25/19 1059  AST 19  ALT 13  ALKPHOS 65  BILITOT 0.8  PROT 7.0  ALBUMIN 4.0   Recent Labs    02/26/19 0626 02/26/19 0914  WBC 6.7 6.3  HGB 8.4* 8.7*  HCT 29.4* 29.9*  MCV 71.0* 68.4*  PLT 193 209   Recent Labs    02/25/19 1213  LABPROT 14.4  INR 1.1      Assessment/Plan: -Symptomatic anemia. Hgb 6.2. /Hemoglobin improved after blood transfusion -Occult blood positive stool -Weight loss.-CT abdomen pelvis with IV contrast yesterday showed 1.3 cm pancreatic lesion. -History of CVA.  On aspirin and Plavix.  Last dose yesterday morning. -Personal history of adenomatous polyp and family history of colon cancer in sister -Tiny 3 mm right middle lobe pulmonary nodule.  Recommendations ------------------------ -CT abdomen pelvis with IV contrast yesterday showed 1.3 cm pancreatic lesion. -Get MRI MRCP for further evaluation. -Okay to resume Plavix from GI standpoint. -Recommend outpatient EGD and colonoscopy after holding Plavix for at least 5 days.    -Recommend  noncontrasted CT chest in 12 months for follow-up on right sided pulmonary nodule.  -Hopefully discharge home after MRI.   Otis Brace MD, [place] 02/26/2019, 11:27 AM  Contact #  [phone]

## 2019-02-26 NOTE — ED Notes (Signed)
Unsuccessful blood draw. RN Claiborne Billings informed

## 2019-02-26 NOTE — ED Notes (Signed)
Tele

## 2019-02-26 NOTE — Progress Notes (Signed)
NURSING PROGRESS NOTE  Dorothy Stanard [id number] Discharge Data: 02/26/2019 6:49 PM Attending Provider: Domenic Polite, MD [id number], Prudencio Burly, MD     Wynona Dove to be D/C'd Home per MD order.  Discussed with the patient the After Visit Summary and all questions fully answered. All IV's discontinued with no bleeding noted. All belongings returned to patient for patient to take home.   Last Vital Signs:  Blood pressure 128/74, pulse 98, temperature 98 F (36.7 C), temperature source Oral, resp. rate 16, SpO2 99 %.  Discharge Medication List Allergies as of 02/26/2019   No Known Allergies     Medication List    TAKE these medications   atorvastatin 40 MG tablet Commonly known as: LIPITOR Take 1 tablet (40 mg total) by mouth daily at 6 PM. What changed: when to take this Notes to patient: 02/27/2019   clopidogrel 75 MG tablet Commonly known as: PLAVIX Take 1 tablet (75 mg total) by mouth daily with breakfast. Notes to patient: 02/27/2019   lisinopril 20 MG tablet Commonly known as: ZESTRIL Take 20 mg by mouth daily. Notes to patient: 02/27/2019   metFORMIN 500 MG tablet Commonly known as: GLUCOPHAGE Take 500 mg by mouth 2 (two) times daily with a meal. Notes to patient: 02/27/2019   metoprolol succinate 100 MG 24 hr tablet Commonly known as: TOPROL-XL Take 100 mg by mouth daily. Notes to patient: 02/27/2019

## 2019-05-24 ENCOUNTER — Other Ambulatory Visit: Payer: Self-pay | Admitting: Family

## 2019-05-24 ENCOUNTER — Ambulatory Visit
Admission: RE | Admit: 2019-05-24 | Discharge: 2019-05-24 | Disposition: A | Discharge status: Home / Self Care | Payer: Medicare Other | Source: Ambulatory Visit | Attending: Family | Admitting: Family

## 2019-05-24 DIAGNOSIS — M65842 Other synovitis and tenosynovitis, left hand: Secondary | ICD-10-CM

## 2019-06-30 DIAGNOSIS — M79645 Pain in left finger(s): Secondary | ICD-10-CM | POA: Insufficient documentation

## 2019-07-01 DIAGNOSIS — M720 Palmar fascial fibromatosis [Dupuytren]: Secondary | ICD-10-CM | POA: Insufficient documentation

## 2019-07-07 ENCOUNTER — Other Ambulatory Visit: Payer: Self-pay | Admitting: Surgery

## 2019-07-07 DIAGNOSIS — H47099 Other disorders of optic nerve, not elsewhere classified, unspecified eye: Secondary | ICD-10-CM

## 2019-07-07 DIAGNOSIS — H53453 Other localized visual field defect, bilateral: Secondary | ICD-10-CM

## 2019-07-08 ENCOUNTER — Emergency Department (HOSPITAL_COMMUNITY)
Admission: EM | Admit: 2019-07-08 | Discharge: 2019-07-08 | Disposition: A | Discharge status: Home / Self Care | Payer: Medicare Other | Attending: Emergency Medicine | Admitting: Emergency Medicine

## 2019-07-08 ENCOUNTER — Encounter (HOSPITAL_COMMUNITY): Payer: Self-pay | Admitting: Emergency Medicine

## 2019-07-08 ENCOUNTER — Other Ambulatory Visit: Payer: Self-pay

## 2019-07-08 DIAGNOSIS — Z7984 Long term (current) use of oral hypoglycemic drugs: Secondary | ICD-10-CM | POA: Diagnosis not present

## 2019-07-08 DIAGNOSIS — Z79899 Other long term (current) drug therapy: Secondary | ICD-10-CM | POA: Diagnosis not present

## 2019-07-08 DIAGNOSIS — R21 Rash and other nonspecific skin eruption: Secondary | ICD-10-CM | POA: Diagnosis present

## 2019-07-08 DIAGNOSIS — Z8673 Personal history of transient ischemic attack (TIA), and cerebral infarction without residual deficits: Secondary | ICD-10-CM | POA: Diagnosis not present

## 2019-07-08 DIAGNOSIS — E119 Type 2 diabetes mellitus without complications: Secondary | ICD-10-CM | POA: Insufficient documentation

## 2019-07-08 DIAGNOSIS — F17210 Nicotine dependence, cigarettes, uncomplicated: Secondary | ICD-10-CM | POA: Insufficient documentation

## 2019-07-08 DIAGNOSIS — I10 Essential (primary) hypertension: Secondary | ICD-10-CM | POA: Diagnosis not present

## 2019-07-08 DIAGNOSIS — Z7901 Long term (current) use of anticoagulants: Secondary | ICD-10-CM | POA: Diagnosis not present

## 2019-07-08 HISTORY — DX: Personal history of other medical treatment: Z92.89

## 2019-07-08 MED ORDER — FAMOTIDINE 20 MG PO TABS: 20.0000 mg | ORAL_TABLET | Freq: Two times a day (BID) | ORAL | 0 refills | Status: DC | PRN

## 2019-07-08 MED ORDER — TRIAMCINOLONE ACETONIDE 0.1 % EX CREA: 1.0000 "application " | TOPICAL_CREAM | Freq: Two times a day (BID) | CUTANEOUS | 0 refills | Status: DC

## 2019-07-08 NOTE — ED Provider Notes (Signed)
[facility] EMERGENCY DEPARTMENT Provider Note   CSN: [id number] Arrival date & time: 07/08/19  [id number]     History Chief Complaint  Patient presents with  . Rash    Elijah Sandoval is a 69 y.o. male with history of diabetes, hypertension, hyperlipidemia, tobacco abuse, previous CVA presents for evaluation of acute onset, persistent rash to the bilateral lower extremities for 1 month.  He reports the lesions are pruritic.  He denies leg swelling, fevers, facial swelling, shortness of breath.  He has been taking Benadryl and had a course of prednisone prescribed to him by his PCP for this with some temporary relief.  He denies any new soaps, shampoos, detergents, lotions.  No insect bites.    The history is provided by the patient.       Past Medical History:  Diagnosis Date  . Diabetes mellitus   . Stroke [facility])    Pt reports TIA in 2013  . Transfusion history     Patient Active Problem List   Diagnosis Date Noted  . Symptomatic anemia 02/25/2019  . Essential hypertension 02/25/2019  . Stroke ([place]) 02/25/2019  . HLD (hyperlipidemia) 01/20/2012  . Tobacco abuse 01/20/2012  . Cerebral infarction ([place]) 01/19/2012    Past Surgical History:  Procedure Laterality Date  . NO PAST SURGERIES         Family History  Problem Relation Age of Onset  . Cancer Sister   . Cancer Sister     Social History   Tobacco Use  . Smoking status: Current Every Day Smoker    Packs/day: 0.25    Years: 15.00    Pack years: 3.75    Types: Cigarettes  . Smokeless tobacco: Never Used  Substance Use Topics  . Alcohol use: No  . Drug use: No    Home Medications Prior to Admission medications   Medication Sig Start Date End Date Taking? Authorizing Provider  atorvastatin (LIPITOR) 40 MG tablet Take 1 tablet (40 mg total) by mouth daily at 6 PM. Patient taking differently: Take 40 mg by mouth daily.  01/20/12   Geradine Girt, DO  clopidogrel (PLAVIX) 75 MG tablet Take 1  tablet (75 mg total) by mouth daily with breakfast. 09/29/13   Philmore Pali, NP  famotidine (PEPCID) 20 MG tablet Take 1 tablet (20 mg total) by mouth 2 (two) times daily as needed (itching). 07/08/19   Dorris Pierre A, PA-C  lisinopril (ZESTRIL) 20 MG tablet Take 20 mg by mouth daily. 02/23/19   [provider]  metFORMIN (GLUCOPHAGE) 500 MG tablet Take 500 mg by mouth 2 (two) times daily with a meal.    [provider]  metoprolol succinate (TOPROL-XL) 100 MG 24 hr tablet Take 100 mg by mouth daily. 12/23/18   [provider]  triamcinolone cream (KENALOG) 0.1 % Apply 1 application topically 2 (two) times daily. 07/08/19   Rodell Perna A, PA-C    Allergies    Patient has no known allergies.  Review of Systems   Review of Systems  Constitutional: Negative for chills and fever.  HENT: Negative for facial swelling.   Respiratory: Negative for shortness of breath.   Skin: Positive for rash.    Physical Exam Updated Vital Signs BP 130/71 (BP Location: Right Arm)   Pulse 63   Temp 98.1 F (36.7 C) (Oral)   Resp 16   Ht 6\' 3"  (1.905 m)   Wt 80.7 kg   SpO2 100%   BMI 22.25  kg/m   Physical Exam Vitals and nursing note reviewed.  Constitutional:      General: He is not in acute distress.    Appearance: He is well-developed.  HENT:     Head: Normocephalic and atraumatic.  Eyes:     General:        Right eye: No discharge.        Left eye: No discharge.     Conjunctiva/sclera: Conjunctivae normal.  Neck:     Vascular: No JVD.     Trachea: No tracheal deviation.  Cardiovascular:     Rate and Rhythm: Normal rate.  Pulmonary:     Effort: Pulmonary effort is normal.  Abdominal:     General: There is no distension.  Skin:    General: Skin is warm and dry.     Findings: Rash present. No erythema.     Comments: See below images.  Patient with multiple hyperpigmented plaques to the bilateral lower extremities along the shin and calf region.  Excoriations noted.   No surrounding erythema, induration, or abnormal drainage.  Nikolsky sign is absent.  Neurological:     Mental Status: He is alert.  Psychiatric:        Behavior: Behavior normal.             ED Results / Procedures / Treatments   Labs (all labs ordered are listed, but only abnormal results are displayed) Labs Reviewed - No data to display  EKG None  Radiology No results found.  Procedures Procedures (including critical care time)  Medications Ordered in ED Medications - No data to display  ED Course  I have reviewed the triage vital signs and the nursing notes.  Pertinent labs & imaging results that were available during my care of the patient were reviewed by me and considered in my medical decision making (see chart for details).    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  Patient is afebrile, vital signs are stable.  He is nontoxic in appearance.  He is neurovascularly intact and compartments are soft.  Patient denies any difficulty breathing or swallowing.  Pt has a patent airway without stridor and is handling secretions without difficulty; no angioedema. No blisters, no pustules, no warmth, no draining sinus tracts, no superficial abscesses, no bullous impetigo, no vesicles, no desquamation, no target lesions with dusky purpura or a central bulla. Not tender to touch. No concern for superimposed infection. No concern for shingles, SJS, TEN, TSS, tick borne illness, syphilis or other life-threatening condition. Will discharge home with short course of topical steroids, pepcid and recommend Benadryl as needed for pruritis.  He has an appointment to see his dermatologist next week and I encouraged him to keep this appointment.  Discussed strict ED return precautions. Patient verbalized understanding of and agreement with plan and is safe for discharge home at this time.      Final Clinical Impression(s) / ED  Diagnoses Final diagnoses:  Rash    Rx / DC Orders ED Discharge Orders         Ordered    triamcinolone cream (KENALOG) 0.1 %  2 times daily     07/08/19 1256    famotidine (PEPCID) 20 MG tablet  2 times daily PRN     07/08/19 1256           Jacalynn Buzzell, [place] A,  PA-C 07/08/19 1303    Isla Pence, MD 07/08/19 1308

## 2019-07-08 NOTE — Discharge Instructions (Addendum)
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  This may resolve on its own or may persist for a long time.  You can apply hydrocortisone cream to the area to help with itching.  You can also take Benadryl every 6 hours as needed for itching and Pepcid twice daily.  Try to avoid itching the area.  Follow-up with your dermatologist next week as scheduled for reevaluation.  Return to the emergency department if any concerning signs or symptoms develop such as fevers, severe swelling, abnormal drainage, facial swelling, difficulty breathing or swallowing.

## 2019-07-08 NOTE — ED Triage Notes (Signed)
Rash started 1 month ago-- discolored, dry skin areas on legs and arms--came today because he could not sleep.

## 2019-07-12 ENCOUNTER — Other Ambulatory Visit: Payer: Self-pay | Admitting: Optometrist

## 2019-07-12 DIAGNOSIS — H47099 Other disorders of optic nerve, not elsewhere classified, unspecified eye: Secondary | ICD-10-CM

## 2019-07-12 DIAGNOSIS — H53453 Other localized visual field defect, bilateral: Secondary | ICD-10-CM

## 2019-07-30 ENCOUNTER — Other Ambulatory Visit: Payer: Medicare Other

## 2019-08-23 ENCOUNTER — Ambulatory Visit
Admission: RE | Admit: 2019-08-23 | Discharge: 2019-08-23 | Disposition: A | Discharge status: Home / Self Care | Payer: Medicare Other | Source: Ambulatory Visit | Attending: Ophthalmology | Admitting: Ophthalmology

## 2019-08-23 ENCOUNTER — Other Ambulatory Visit: Payer: Self-pay

## 2019-08-23 DIAGNOSIS — H47099 Other disorders of optic nerve, not elsewhere classified, unspecified eye: Secondary | ICD-10-CM

## 2019-08-23 DIAGNOSIS — H53453 Other localized visual field defect, bilateral: Secondary | ICD-10-CM

## 2019-08-23 MED ORDER — GADOBENATE DIMEGLUMINE 529 MG/ML IV SOLN
15.0000 mL | Freq: Once | INTRAVENOUS | Status: AC | PRN
  Administered 2019-08-23: 15 mL via INTRAVENOUS

## 2019-08-23 MED ORDER — GADOBENATE DIMEGLUMINE 529 MG/ML IV SOLN: 15.0000 mL | Freq: Once | INTRAVENOUS | Status: DC | PRN

## 2019-10-14 ENCOUNTER — Other Ambulatory Visit: Payer: Self-pay | Admitting: Gastroenterology

## 2019-10-14 DIAGNOSIS — K869 Disease of pancreas, unspecified: Secondary | ICD-10-CM

## 2019-10-14 DIAGNOSIS — R911 Solitary pulmonary nodule: Secondary | ICD-10-CM

## 2019-11-09 ENCOUNTER — Other Ambulatory Visit: Payer: Medicare Other

## 2019-12-01 ENCOUNTER — Ambulatory Visit
Admission: RE | Admit: 2019-12-01 | Discharge: 2019-12-01 | Disposition: A | Discharge status: Home / Self Care | Payer: Medicare Other | Source: Ambulatory Visit | Attending: Gastroenterology | Admitting: Gastroenterology

## 2019-12-01 DIAGNOSIS — K869 Disease of pancreas, unspecified: Secondary | ICD-10-CM

## 2019-12-01 DIAGNOSIS — R911 Solitary pulmonary nodule: Secondary | ICD-10-CM

## 2019-12-01 MED ORDER — GADOBENATE DIMEGLUMINE 529 MG/ML IV SOLN
18.0000 mL | Freq: Once | INTRAVENOUS | Status: AC | PRN
  Administered 2019-12-01: 18 mL via INTRAVENOUS

## 2020-01-05 ENCOUNTER — Other Ambulatory Visit: Payer: Self-pay | Admitting: Gastroenterology

## 2020-01-05 DIAGNOSIS — R911 Solitary pulmonary nodule: Secondary | ICD-10-CM

## 2020-02-01 ENCOUNTER — Ambulatory Visit
Admission: RE | Admit: 2020-02-01 | Discharge: 2020-02-01 | Disposition: A | Discharge status: Home / Self Care | Payer: Medicare Other | Source: Ambulatory Visit | Attending: Gastroenterology | Admitting: Gastroenterology

## 2020-02-01 DIAGNOSIS — R911 Solitary pulmonary nodule: Secondary | ICD-10-CM

## 2020-03-07 ENCOUNTER — Ambulatory Visit (INDEPENDENT_AMBULATORY_CARE_PROVIDER_SITE_OTHER): Payer: Medicare Other | Admitting: Pulmonary Disease

## 2020-03-07 ENCOUNTER — Encounter: Payer: Self-pay | Admitting: Pulmonary Disease

## 2020-03-07 ENCOUNTER — Other Ambulatory Visit: Payer: Self-pay

## 2020-03-07 VITALS — BP 136/80 | HR 90 | Temp 97.9°F | Ht 75.0 in | Wt 186.4 lb

## 2020-03-07 DIAGNOSIS — R911 Solitary pulmonary nodule: Secondary | ICD-10-CM

## 2020-03-07 NOTE — Patient Instructions (Signed)
For the spot in the lung -We will repeat a CT in 3 months-to follow-up on the spot -If it does not change, we will continue to follow it up with CT scans -The risk of it turning into something cancerous depends on you continuing to smoke plus all the smoking over the years  Emphysema -Risk going forward is not to do more harm to the lungs  We will order a breathing study to assess the degree of dysfunction  Breathing treatments can help with shortness of breath, can also be of benefit if there is significant abnormality on the breathing study  Most important thing going forward is to quit smoking as we discussed  Follow-up in 3 months

## 2020-03-07 NOTE — Progress Notes (Signed)
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 atorvastatin (LIPITOR) 40 MG tablet Take 1 tablet (40 mg total) by mouth daily at 6 PM. (Patient taking differently: Take 40 mg by mouth daily. )  . clopidogrel (PLAVIX) 75 MG tablet Take 1 tablet (75 mg total) by mouth daily with breakfast.  . famotidine (PEPCID) 20 MG tablet Take 1 tablet (20 mg total) by mouth 2 (two) times daily as needed (itching).  . ferrous sulfate 325 (65 FE) MG tablet Take 325 mg by mouth daily.  Marland Kitchen glipiZIDE (GLUCOTROL) 10 MG tablet Take 10 mg by mouth 2 (two) times daily.  Marland Kitchen lisinopril (ZESTRIL) 20 MG tablet Take 20 mg by mouth daily.  . methocarbamol (ROBAXIN) 500 MG tablet methocarbamol 500 mg tablet  TAKE 1 2 TABLETS (1,000 MG) BY ORAL ROUTE 4 TIMES PER DAY AS NEEDED FOR MUSCLE SPASM FOR 30 DAYS  . metoprolol succinate (TOPROL-XL) 100 MG 24 hr tablet Take 100 mg by mouth daily.  . pantoprazole (PROTONIX) 40 MG tablet Take 40 mg by mouth 2 (two) times daily.  . sucralfate (CARAFATE) 1 g tablet sucralfate 1 gram tablet  . triamcinolone cream (KENALOG) 0.1 % Apply 1 application topically 2 (two) times daily.  . [DISCONTINUED] metFORMIN (GLUCOPHAGE) 500 MG tablet Take 500 mg by mouth 2 (two) times daily with a meal.  .  [DISCONTINUED] triamcinolone ointment (KENALOG) 0.1 % Apply 1 application topically 2 (two) times daily.   No facility-administered encounter medications on file as of 03/07/2020.    Allergies as of 03/07/2020  . (No Known Allergies)    Past Medical History:  Diagnosis Date  . Diabetes mellitus   . Stroke [facility])    Pt reports TIA in 2013  . Transfusion history     Past Surgical History:  Procedure Laterality Date  . NO PAST SURGERIES      Family History  Problem Relation Age of Onset  . Cancer Sister   . Cancer Sister     Social History   Socioeconomic History  . Marital status: Married    Spouse name: [name]  . Number of children: 3  . Years of education: HS  . Highest education level: Not on file  Occupational History  . Occupation: self employed    Employer: [employer]  Tobacco Use  . Smoking status: Current Every Day Smoker    Packs/day: 0.25    Years: 15.00    Pack years: 3.75    Types: Cigarettes  . Smokeless tobacco: Never Used  Substance and Sexual Activity  . Alcohol use: No  . Drug use: No  . Sexual activity: Not on file  Other Topics Concern  . Not on file  Social History Narrative   Patient lives at home with  spouse.   Caffeine Use: Coffee on weekends   Social Determinants of Health   Financial Resource Strain:   . Difficulty of Paying Living Expenses: Not on file  Food Insecurity:   . Worried About Charity fundraiser in the Last Year: Not on file  . Ran Out of Food in the Last Year: Not on file  Transportation Needs:   . Lack of Transportation (Medical): Not on file  . Lack of Transportation (Non-Medical): Not on file  Physical Activity:   . Days of Exercise per Week: Not on file  . Minutes of Exercise per Session: Not on file  Stress:   . Feeling of Stress : Not on file  Social Connections:   . Frequency of Communication with Friends and Family: Not on file  . Frequency of Social Gatherings with Friends and Family:  Not on file  . Attends Religious Services: Not on file  . Active Member of Clubs or Organizations: Not on file  . Attends Archivist Meetings: Not on file  . Marital Status: Not on file  Intimate Partner Violence:   . Fear of Current or Ex-Partner: Not on file  . Emotionally Abused: Not on file  . Physically Abused: Not on file  . Sexually Abused: Not on file    Review of Systems  Constitutional: Negative for fatigue.  Respiratory: Negative for cough and shortness of breath.     Vitals:   03/07/20 0929  BP: 136/80  Pulse: 90  Temp: 97.9 F (36.6 C)  SpO2: 95%     Physical Exam Constitutional:      Appearance: Normal appearance.  HENT:     Head: Normocephalic and atraumatic.     Nose: No congestion.     Mouth/Throat:     Pharynx: No oropharyngeal exudate.  Eyes:     General:        Right eye: No discharge.        Left eye: No discharge.  Cardiovascular:     Rate and Rhythm: Normal rate and regular rhythm.     Pulses: Normal pulses.     Heart sounds: Normal heart sounds. No murmur heard.  No friction rub.  Pulmonary:     Effort: Pulmonary effort is normal. No respiratory distress.     Breath sounds: Normal breath sounds. No stridor. No wheezing or rhonchi.  Musculoskeletal:     Cervical back: No rigidity or tenderness.  Neurological:     Mental Status: He is alert.  Psychiatric:        Mood and Affect: Mood normal.    Data Reviewed: CT scan of the chest was reviewed with the patient showing a 5 mm lung nodule right lower lobe, left upper lobe nodular infiltrate Had had a previous abdominal CT showing the right middle lobe nodule, current nodule is more prominent The left upper lobe nodule is new-no previous CT to compare with, it is also perifissural  Previous myocardial scan revealing ejection fraction of 45%  Assessment:  Lung nodule  Emphysema  Active smoker  Smoking cessation counseling -He wants to work on quitting by  himself  Plan/Recommendations: Schedule patient for a CT scan of the chest in 3 months to follow-up on the lung nodule in the right midlung and left upper lobe nodule  Obtain a pulmonary function test  Smoking cessation counseling  The risk of continuing to smoke was discussed  Nodules need followed closely   Sherrilyn Rist MD [facility]  03/07/2020, 9:56 AM  CC: Otis Brace, MD

## 2020-05-02 ENCOUNTER — Emergency Department (HOSPITAL_COMMUNITY): Payer: Medicare Other

## 2020-05-02 ENCOUNTER — Other Ambulatory Visit: Payer: Self-pay

## 2020-05-02 ENCOUNTER — Encounter (HOSPITAL_COMMUNITY): Payer: Self-pay

## 2020-05-02 DIAGNOSIS — H538 Other visual disturbances: Secondary | ICD-10-CM | POA: Insufficient documentation

## 2020-05-02 DIAGNOSIS — Z5321 Procedure and treatment not carried out due to patient leaving prior to being seen by health care provider: Secondary | ICD-10-CM | POA: Insufficient documentation

## 2020-05-02 DIAGNOSIS — U071 COVID-19: Secondary | ICD-10-CM | POA: Diagnosis not present

## 2020-05-02 DIAGNOSIS — R799 Abnormal finding of blood chemistry, unspecified: Secondary | ICD-10-CM | POA: Insufficient documentation

## 2020-05-02 DIAGNOSIS — R0902 Hypoxemia: Secondary | ICD-10-CM | POA: Diagnosis not present

## 2020-05-02 DIAGNOSIS — R410 Disorientation, unspecified: Secondary | ICD-10-CM | POA: Insufficient documentation

## 2020-05-02 NOTE — ED Triage Notes (Signed)
Pt arrived via walk in, states he was seen by PCP and was told his blood count was low. Paperwork pt brought with him states he suffered a fall on Sat, witnessed by wife, states he was confused, and had not been treated yet. Per wife, pt was confused and had blurred vision, on blood thinners. Pt denies any sx at this time.

## 2020-05-03 ENCOUNTER — Emergency Department (HOSPITAL_COMMUNITY)
Admission: EM | Admit: 2020-05-03 | Discharge: 2020-05-03 | Disposition: A | Discharge status: Home / Self Care | Payer: Medicare Other | Source: Home / Self Care

## 2020-05-05 ENCOUNTER — Inpatient Hospital Stay (HOSPITAL_COMMUNITY): Payer: Medicare Other

## 2020-05-05 ENCOUNTER — Emergency Department (HOSPITAL_COMMUNITY): Payer: Medicare Other

## 2020-05-05 ENCOUNTER — Encounter (HOSPITAL_COMMUNITY): Payer: Self-pay

## 2020-05-05 ENCOUNTER — Other Ambulatory Visit: Payer: Self-pay

## 2020-05-05 ENCOUNTER — Inpatient Hospital Stay (HOSPITAL_COMMUNITY)
Admission: EM | Admit: 2020-05-05 | Discharge: 2020-05-09 | DRG: 177 | Disposition: A | Discharge status: Home / Self Care | Payer: Medicare Other | Attending: Internal Medicine | Admitting: Internal Medicine

## 2020-05-05 DIAGNOSIS — Z91040 Latex allergy status: Secondary | ICD-10-CM | POA: Diagnosis not present

## 2020-05-05 DIAGNOSIS — U071 COVID-19: Secondary | ICD-10-CM | POA: Diagnosis present

## 2020-05-05 DIAGNOSIS — Z7902 Long term (current) use of antithrombotics/antiplatelets: Secondary | ICD-10-CM

## 2020-05-05 DIAGNOSIS — E785 Hyperlipidemia, unspecified: Secondary | ICD-10-CM | POA: Diagnosis present

## 2020-05-05 DIAGNOSIS — I959 Hypotension, unspecified: Secondary | ICD-10-CM | POA: Diagnosis present

## 2020-05-05 DIAGNOSIS — E119 Type 2 diabetes mellitus without complications: Secondary | ICD-10-CM | POA: Diagnosis present

## 2020-05-05 DIAGNOSIS — J9601 Acute respiratory failure with hypoxia: Secondary | ICD-10-CM | POA: Diagnosis present

## 2020-05-05 DIAGNOSIS — Z8673 Personal history of transient ischemic attack (TIA), and cerebral infarction without residual deficits: Secondary | ICD-10-CM

## 2020-05-05 DIAGNOSIS — Z87891 Personal history of nicotine dependence: Secondary | ICD-10-CM

## 2020-05-05 DIAGNOSIS — D509 Iron deficiency anemia, unspecified: Secondary | ICD-10-CM | POA: Diagnosis present

## 2020-05-05 DIAGNOSIS — R7989 Other specified abnormal findings of blood chemistry: Secondary | ICD-10-CM | POA: Diagnosis present

## 2020-05-05 DIAGNOSIS — E860 Dehydration: Secondary | ICD-10-CM | POA: Diagnosis present

## 2020-05-05 DIAGNOSIS — R197 Diarrhea, unspecified: Secondary | ICD-10-CM | POA: Diagnosis present

## 2020-05-05 DIAGNOSIS — I10 Essential (primary) hypertension: Secondary | ICD-10-CM | POA: Diagnosis present

## 2020-05-05 DIAGNOSIS — N179 Acute kidney failure, unspecified: Secondary | ICD-10-CM | POA: Diagnosis present

## 2020-05-05 DIAGNOSIS — R0902 Hypoxemia: Secondary | ICD-10-CM

## 2020-05-05 DIAGNOSIS — D72819 Decreased white blood cell count, unspecified: Secondary | ICD-10-CM | POA: Diagnosis present

## 2020-05-05 DIAGNOSIS — Z7984 Long term (current) use of oral hypoglycemic drugs: Secondary | ICD-10-CM

## 2020-05-05 DIAGNOSIS — Z79899 Other long term (current) drug therapy: Secondary | ICD-10-CM

## 2020-05-05 DIAGNOSIS — R1013 Epigastric pain: Secondary | ICD-10-CM | POA: Diagnosis not present

## 2020-05-05 DIAGNOSIS — R109 Unspecified abdominal pain: Secondary | ICD-10-CM

## 2020-05-05 DIAGNOSIS — J1282 Pneumonia due to coronavirus disease 2019: Secondary | ICD-10-CM | POA: Diagnosis present

## 2020-05-05 DIAGNOSIS — R911 Solitary pulmonary nodule: Secondary | ICD-10-CM | POA: Diagnosis present

## 2020-05-05 LAB — COMPREHENSIVE METABOLIC PANEL
ALT: 33 U/L (ref 0–44)
AST: 57 U/L — ABNORMAL HIGH (ref 15–41)
Albumin: 3.9 g/dL (ref 3.5–5.0)
Alkaline Phosphatase: 56 U/L (ref 38–126)
Anion gap: 12 (ref 5–15)
BUN: 29 mg/dL — ABNORMAL HIGH (ref 8–23)
CO2: 21 mmol/L — ABNORMAL LOW (ref 22–32)
Calcium: 8.8 mg/dL — ABNORMAL LOW (ref 8.9–10.3)
Chloride: 106 mmol/L (ref 98–111)
Creatinine, Ser: 1.8 mg/dL — ABNORMAL HIGH (ref 0.61–1.24)
GFR, Estimated: 40 mL/min — ABNORMAL LOW (ref >60)
Glucose, Bld: 74 mg/dL (ref 70–99)
Potassium: 3.7 mmol/L (ref 3.5–5.1)
Sodium: 139 mmol/L (ref 135–145)
Total Bilirubin: 0.7 mg/dL (ref 0.3–1.2)
Total Protein: 7.3 g/dL (ref 6.5–8.1)

## 2020-05-05 LAB — CBC
HCT: 34.8 % — ABNORMAL LOW (ref 39.0–52.0)
HCT: 26.6 % — ABNORMAL LOW (ref 39.0–52.0)
Hemoglobin: 11.3 g/dL — ABNORMAL LOW (ref 13.0–17.0)
Hemoglobin: 8.7 g/dL — ABNORMAL LOW (ref 13.0–17.0)
MCH: 29.1 pg (ref 26.0–34.0)
MCH: 29.4 pg (ref 26.0–34.0)
MCHC: 32.5 g/dL (ref 30.0–36.0)
MCHC: 32.7 g/dL (ref 30.0–36.0)
MCV: 89.7 fL (ref 80.0–100.0)
MCV: 89.9 fL (ref 80.0–100.0)
Platelets: 212 10*3/uL (ref 150–400)
Platelets: 152 10*3/uL (ref 150–400)
RBC: 3.88 MIL/uL — ABNORMAL LOW (ref 4.22–5.81)
RBC: 2.96 MIL/uL — ABNORMAL LOW (ref 4.22–5.81)
RDW: 13.4 % (ref 11.5–15.5)
RDW: 13.4 % (ref 11.5–15.5)
WBC: 4.1 10*3/uL (ref 4.0–10.5)
WBC: 2.2 10*3/uL — ABNORMAL LOW (ref 4.0–10.5)
nRBC: 0 % (ref 0.0–0.2)
nRBC: 0 % (ref 0.0–0.2)

## 2020-05-05 LAB — FERRITIN: Ferritin: 103 ng/mL (ref 24–336)

## 2020-05-05 LAB — TRIGLYCERIDES: Triglycerides: 62 mg/dL (ref <150)

## 2020-05-05 LAB — PROCALCITONIN: Procalcitonin: <0.1 ng/mL

## 2020-05-05 LAB — TROPONIN I (HIGH SENSITIVITY): Troponin I (High Sensitivity): 14 ng/L (ref <18)

## 2020-05-05 LAB — POC SARS CORONAVIRUS 2 AG -  ED: SARS Coronavirus 2 Ag: POSITIVE — AB

## 2020-05-05 LAB — D-DIMER, QUANTITATIVE: D-Dimer, Quant: 1.34 ug/mL-FEU — ABNORMAL HIGH (ref 0.00–0.50)

## 2020-05-05 LAB — CREATININE, SERUM
Creatinine, Ser: 1.36 mg/dL — ABNORMAL HIGH (ref 0.61–1.24)
GFR, Estimated: 56 mL/min — ABNORMAL LOW (ref >60)

## 2020-05-05 LAB — C-REACTIVE PROTEIN: CRP: 6.1 mg/dL — ABNORMAL HIGH (ref <1.0)

## 2020-05-05 LAB — LIPASE, BLOOD: Lipase: 43 U/L (ref 11–51)

## 2020-05-05 LAB — LACTATE DEHYDROGENASE: LDH: 285 U/L — ABNORMAL HIGH (ref 98–192)

## 2020-05-05 LAB — FIBRINOGEN: Fibrinogen: 667 mg/dL — ABNORMAL HIGH (ref 210–475)

## 2020-05-05 MED ORDER — IPRATROPIUM-ALBUTEROL 20-100 MCG/ACT IN AERS
1.0000 | INHALATION_SPRAY | Freq: Four times a day (QID) | RESPIRATORY_TRACT | Status: DC
  Administered 2020-05-06 – 2020-05-09 (×12): 1 via RESPIRATORY_TRACT
  Filled 2020-05-05: qty 4

## 2020-05-05 MED ORDER — INSULIN ASPART 100 UNIT/ML ~~LOC~~ SOLN
0.0000 [IU] | Freq: Three times a day (TID) | SUBCUTANEOUS | Status: DC
  Administered 2020-05-06: 2 [IU] via SUBCUTANEOUS
  Administered 2020-05-06: 3 [IU] via SUBCUTANEOUS
  Administered 2020-05-06: 2 [IU] via SUBCUTANEOUS
  Administered 2020-05-07 (×2): 3 [IU] via SUBCUTANEOUS
  Administered 2020-05-07: 2 [IU] via SUBCUTANEOUS
  Administered 2020-05-08: 1 [IU] via SUBCUTANEOUS
  Administered 2020-05-08: 3 [IU] via SUBCUTANEOUS
  Administered 2020-05-08: 1 [IU] via SUBCUTANEOUS
  Administered 2020-05-09: 2 [IU] via SUBCUTANEOUS
  Filled 2020-05-05: qty 0.09

## 2020-05-05 MED ORDER — SODIUM CHLORIDE 0.9 % IV SOLN
100.0000 mg | Freq: Every day | INTRAVENOUS | Status: AC
  Administered 2020-05-06 – 2020-05-09 (×4): 100 mg via INTRAVENOUS
  Filled 2020-05-05 (×4): qty 20

## 2020-05-05 MED ORDER — ACETAMINOPHEN 325 MG PO TABS
650.0000 mg | ORAL_TABLET | Freq: Once | ORAL | Status: AC
  Administered 2020-05-05: 650 mg via ORAL
  Filled 2020-05-05: qty 2

## 2020-05-05 MED ORDER — ENOXAPARIN SODIUM 40 MG/0.4ML ~~LOC~~ SOLN
40.0000 mg | SUBCUTANEOUS | Status: DC
  Administered 2020-05-05 – 2020-05-08 (×4): 40 mg via SUBCUTANEOUS
  Filled 2020-05-05 (×4): qty 0.4

## 2020-05-05 MED ORDER — SODIUM CHLORIDE 0.9 % IV SOLN
200.0000 mg | Freq: Once | INTRAVENOUS | Status: AC
  Administered 2020-05-05: 200 mg via INTRAVENOUS
  Filled 2020-05-05: qty 200

## 2020-05-05 MED ORDER — DEXAMETHASONE SODIUM PHOSPHATE 10 MG/ML IJ SOLN
6.0000 mg | INTRAMUSCULAR | Status: DC
  Administered 2020-05-06 – 2020-05-09 (×4): 6 mg via INTRAVENOUS
  Filled 2020-05-05 (×4): qty 1

## 2020-05-05 MED ORDER — DEXAMETHASONE SODIUM PHOSPHATE 10 MG/ML IJ SOLN
8.0000 mg | Freq: Once | INTRAMUSCULAR | Status: AC
  Administered 2020-05-05: 8 mg via INTRAVENOUS
  Filled 2020-05-05: qty 1

## 2020-05-05 MED ORDER — SODIUM CHLORIDE 0.9 % IV BOLUS
1000.0000 mL | Freq: Once | INTRAVENOUS | Status: AC
  Administered 2020-05-05: 1000 mL via INTRAVENOUS

## 2020-05-05 MED ORDER — GUAIFENESIN-DM 100-10 MG/5ML PO SYRP: 10.0000 mL | ORAL_SOLUTION | ORAL | Status: DC | PRN

## 2020-05-05 MED ORDER — SODIUM CHLORIDE 0.9 % IV BOLUS
500.0000 mL | Freq: Once | INTRAVENOUS | Status: AC
  Administered 2020-05-05: 500 mL via INTRAVENOUS

## 2020-05-05 MED ORDER — HYDROCOD POLST-CPM POLST ER 10-8 MG/5ML PO SUER: 5.0000 mL | Freq: Two times a day (BID) | ORAL | Status: DC | PRN

## 2020-05-05 MED ORDER — ACETAMINOPHEN 325 MG PO TABS: 650.0000 mg | ORAL_TABLET | Freq: Four times a day (QID) | ORAL | Status: DC | PRN

## 2020-05-05 MED ORDER — ASCORBIC ACID 500 MG PO TABS
500.0000 mg | ORAL_TABLET | Freq: Every day | ORAL | Status: DC
  Administered 2020-05-05 – 2020-05-09 (×5): 500 mg via ORAL
  Filled 2020-05-05 (×5): qty 1

## 2020-05-05 MED ORDER — ZINC SULFATE 220 (50 ZN) MG PO CAPS
220.0000 mg | ORAL_CAPSULE | Freq: Every day | ORAL | Status: DC
  Administered 2020-05-05 – 2020-05-09 (×5): 220 mg via ORAL
  Filled 2020-05-05 (×5): qty 1

## 2020-05-05 MED ORDER — ONDANSETRON HCL 4 MG/2ML IJ SOLN
4.0000 mg | Freq: Once | INTRAMUSCULAR | Status: AC
  Administered 2020-05-05: 4 mg via INTRAVENOUS
  Filled 2020-05-05: qty 2

## 2020-05-05 NOTE — ED Notes (Signed)
Patient's O2 saturation down to 89% on 2L [place]. O2 bumped to 3L [place]

## 2020-05-05 NOTE — ED Triage Notes (Signed)
Patient c/o abdominal pain and a cough x 3 days.

## 2020-05-05 NOTE — ED Notes (Signed)
Attempted to sit patient up to reposition and his O2 sat dropped to 87% with a good pleth. Patient's BP is also low even after 1049ml of fluid. MD called to bedside and made aware.

## 2020-05-05 NOTE — H&P (Signed)
History and Physical    Vedansh Kerstetter EPP:[id number] DOB: [birth year] DOA: 05/05/2020  PCP: Charolette Forward, MD  Patient coming from: Home  Chief Complaint: abdominal pain, dyspnea, diarrhea  HPI: Wilfrid Hyser is a 70 y.o. male with medical history significant of DM2, HTN. Presenting with stomach pain, dyspnea, and diarrhea. He reports that his symptoms began last week. It started with a  Global abdominal pain and diarrhea. He felt "tightness" throughout his stomach. He tried OTC meds, but they did not help. He reports feeling progressively short of breath and having cough/fever over that time. Again, he tried OTC meds to help, but thy did not. He symptoms got to the point where he was not tolerating a diet. No active vomiting, just no appetite. He decided that it was time to come to the ED. He denies any other aggravating or alleviating factors.    ED Course: He was found to be COVID positive and dehydrated. He was given fluids, remdes, and steroids. TRH was called for admission.   Review of Systems:  Denies CP pain, palpitations. Review of systems is otherwise negative for all not mentioned in HPI.   PMHx Past Medical History:  Diagnosis Date  . Diabetes mellitus   . Stroke [facility])    Pt reports TIA in 2013  . Transfusion history     PSHx Past Surgical History:  Procedure Laterality Date  . NO PAST SURGERIES      SocHx  reports that he has been smoking cigarettes. He has a 3.75 pack-year smoking history. He has never used smokeless tobacco. He reports that he does not drink alcohol and does not use drugs.  Allergies  Allergen Reactions  . Latex     Other reaction(s): Unknown    FamHx Family History  Problem Relation Age of Onset  . Cancer Sister   . Cancer Sister     Prior to Admission medications   Medication Sig Start Date End Date Taking? Authorizing Provider  amLODipine (NORVASC) 5 MG tablet Take 5 mg by mouth daily. 05/05/20   [provider]  atorvastatin  (LIPITOR) 40 MG tablet Take 1 tablet (40 mg total) by mouth daily at 6 PM. Patient taking differently: Take 40 mg by mouth daily.  01/20/12   Geradine Girt, DO  clopidogrel (PLAVIX) 75 MG tablet Take 1 tablet (75 mg total) by mouth daily with breakfast. 09/29/13   Philmore Pali, NP  famotidine (PEPCID) 20 MG tablet Take 1 tablet (20 mg total) by mouth 2 (two) times daily as needed (itching). 07/08/19   Fawze, Mina A, PA-C  ferrous sulfate 325 (65 FE) MG tablet Take 325 mg by mouth daily. 12/27/19   [provider]  glipiZIDE (GLUCOTROL) 10 MG tablet Take 10 mg by mouth 2 (two) times daily. 02/22/20   [provider]  latanoprost (XALATAN) 0.005 % ophthalmic solution Place 1 drop into both eyes at bedtime. 04/17/20   [provider]  lisinopril (ZESTRIL) 20 MG tablet Take 20 mg by mouth daily. 02/23/19   [provider]  lisinopril (ZESTRIL) 40 MG tablet Take 40 mg by mouth daily. 03/15/20   [provider]  methocarbamol (ROBAXIN) 500 MG tablet methocarbamol 500 mg tablet  TAKE 1 2 TABLETS (1,000 MG) BY ORAL ROUTE 4 TIMES PER DAY AS NEEDED FOR MUSCLE SPASM FOR 30 DAYS    [provider]  metoprolol succinate (TOPROL-XL) 100 MG 24 hr tablet Take 100 mg by mouth daily. 12/23/18   [provider]  pantoprazole (PROTONIX) 40 MG tablet Take 40 mg by mouth 2 (two) times daily. 12/18/19   [provider]  sucralfate (CARAFATE) 1 g tablet sucralfate 1 gram tablet    [provider]  triamcinolone cream (KENALOG) 0.1 % Apply 1 application topically 2 (two) times daily. 07/08/19   Renita Papa, PA-C    Physical Exam: Vitals:   05/05/20 1400 05/05/20 1500 05/05/20 1503 05/05/20 1519  BP: 112/62 (!) 93/52 (!) 93/52 (!) 97/45  Pulse: 89 80 81 70  Resp: (!) 29 (!) 24 (!) 21   Temp:      TempSrc:      SpO2: 91% 93% 93% (!) 87%  Weight:      Height:        General: 70 y.o. male resting in bed in NAD Eyes: PERRL, normal sclera ENMT:  Nares patent w/o discharge, orophaynx clear, dentition normal, ears w/o discharge/lesions/ulcers Neck: Supple, trachea midline Cardiovascular: RRR, +S1, S2, no m/g/r, equal pulses throughout Respiratory: decreased at bases, no w/r/r, normal WOB GI: BS+, NDNT, no masses noted, no organomegaly noted MSK: No e/c/c Skin: No rashes, bruises, ulcerations noted Neuro: A&O x 3, no focal deficits Psyc: Appropriate interaction and affect, calm/cooperative  Labs on Admission: I have personally reviewed following labs and imaging studies  CBC: Recent Labs  Lab 05/05/20 1016  WBC 4.1  HGB 11.3*  HCT 34.8*  MCV 89.7  PLT 710   Basic Metabolic Panel: Recent Labs  Lab 05/05/20 1016  NA 139  K 3.7  CL 106  CO2 21*  GLUCOSE 74  BUN 29*  CREATININE 1.80*  CALCIUM 8.8*   GFR: Estimated Creatinine Clearance: 46.3 mL/min (A) (by C-G formula based on SCr of 1.8 mg/dL (H)). Liver Function Tests: Recent Labs  Lab 05/05/20 1016  AST 57*  ALT 33  ALKPHOS 56  BILITOT 0.7  PROT 7.3  ALBUMIN 3.9   Recent Labs  Lab 05/05/20 1016  LIPASE 43   No results for input(s): AMMONIA in the last 168 hours. Coagulation Profile: No results for input(s): INR, PROTIME in the last 168 hours. Cardiac Enzymes: No results for input(s): CKTOTAL, CKMB, CKMBINDEX, TROPONINI in the last 168 hours. BNP (last 3 results) No results for input(s): PROBNP in the last 8760 hours. HbA1C: No results for input(s): HGBA1C in the last 72 hours. CBG: No results for input(s): GLUCAP in the last 168 hours. Lipid Profile: No results for input(s): CHOL, HDL, LDLCALC, TRIG, CHOLHDL, LDLDIRECT in the last 72 hours. Thyroid Function Tests: No results for input(s): TSH, T4TOTAL, FREET4, T3FREE, THYROIDAB in the last 72 hours. Anemia Panel: No results for input(s): VITAMINB12, FOLATE, FERRITIN, TIBC, IRON, RETICCTPCT in the last 72 hours. Urine analysis:    Component Value Date/Time   COLORURINE YELLOW 04/19/2015 0752    APPEARANCEUR CLOUDY (A) 04/19/2015 0752   LABSPEC 1.025 04/19/2015 0752   PHURINE 5.5 04/19/2015 0752   GLUCOSEU 100 (A) 04/19/2015 0752   HGBUR NEGATIVE 04/19/2015 0752   BILIRUBINUR SMALL (A) 04/19/2015 0752   KETONESUR NEGATIVE 04/19/2015 0752   PROTEINUR 30 (A) 04/19/2015 0752   NITRITE NEGATIVE 04/19/2015 0752   LEUKOCYTESUR NEGATIVE 04/19/2015 0752    Radiological Exams on Admission: DG Chest Portable 1 View  Result Date: 05/05/2020 CLINICAL DATA:  SOB, cough EXAM: PORTABLE CHEST 1 VIEW COMPARISON:  02/01/2020 and prior. FINDINGS: No pneumothorax or pleural effusion. Interstitial prominence with patchy basilar predominant opacities. Cardiomediastinal silhouette within normal limits. No acute osseous abnormality. IMPRESSION: Interstitial  prominence and pulmonary opacities concerning for infection. Electronically Signed   By: Primitivo Gauze M.D.   On: 05/05/2020 14:22    EKG: Independently reviewed. Sinus, no st elevations  Assessment/Plan COVID-19 PNA     - admit to inpt, med-surg     - steroids, remdes, inhalers, anti-tussives, IS, FV     - currently on 2L, wean O2 as able     - follow inflammatory markers  AKI     - baseline Scr 1.1 - 1.2     - fluids, check renal US  Elevated LFTs Ab pain Diarrhea     - check hepatitis panel, follow AM labs     - benign ab exam, check KUB     - lipase negative     - check GI PCR, C diff  DM2     - SSI, A1c, DM diet, glucose checks  HTN     - was hypotensive at admission; hold home meds for right now  HLD     - resume home statin when med rec completed   DVT prophylaxis: lovenox  Code Status: FULL  Family Communication: None at bedside  Consults called: None   Status is: Inpatient  Remains inpatient appropriate because:Inpatient level of care appropriate due to severity of illness   Dispo: The patient is from: Home              Anticipated d/c is to: Home              Anticipated d/c date is: > 3 days               Patient currently is not medically stable to d/c.   Difficult to place patient No  [name] DO [facility]  If 7PM-7AM, please contact night-coverage [email or web address]  05/05/2020, 4:23 PM

## 2020-05-05 NOTE — ED Provider Notes (Signed)
[facility] DEPT Provider Note   CSN: [id number] Arrival date & time: 05/05/20  7672     History Chief Complaint  Patient presents with   Abdominal Pain   Cough    Elijah Sandoval is a 70 y.o. male with hx of diabetes, stroke, presenting to ED with fever, chills, cough, myalgias, poor appetite, diarrhea and abdominal pain.  Onset 2 days ago.  Difficulty keeping down food or fluids.  No vomiting.  Diffuse diarrhea.   Also reports dry cough, chills.  He has not had the covid vaccines.  He lives with his wife who is feeling well. Abdominal pain is diffuse, cramping, comes and goes, currently moderate intensity, does not radiate, and he's never had it before.  HPI     Past Medical History:  Diagnosis Date   Diabetes mellitus    Stroke ([place])    Pt reports TIA in 2013   Transfusion history     Patient Active Problem List   Diagnosis Date Noted   COVID-19 05/05/2020   Symptomatic anemia 02/25/2019   Essential hypertension 02/25/2019   Stroke ([place]) 02/25/2019   HLD (hyperlipidemia) 01/20/2012   Tobacco abuse 01/20/2012   Cerebral infarction ([place]) 01/19/2012    Past Surgical History:  Procedure Laterality Date   NO PAST SURGERIES         Family History  Problem Relation Age of Onset   Cancer Sister    Cancer Sister     Social History   Tobacco Use   Smoking status: Current Every Day Smoker    Packs/day: 0.25    Years: 15.00    Pack years: 3.75    Types: Cigarettes   Smokeless tobacco: Never Used  Scientific laboratory technician Use: Never used  Substance Use Topics   Alcohol use: No   Drug use: No    Home Medications Prior to Admission medications   Medication Sig Start Date End Date Taking? Authorizing Provider  amLODipine (NORVASC) 5 MG tablet Take 5 mg by mouth daily. 05/05/20  Yes [provider]  atorvastatin (LIPITOR) 40 MG tablet Take 1 tablet (40 mg total) by mouth daily at 6 PM. Patient taking  differently: Take 40 mg by mouth daily. 01/20/12  Yes Geradine Girt, DO  clopidogrel (PLAVIX) 75 MG tablet Take 1 tablet (75 mg total) by mouth daily with breakfast. 09/29/13  Yes Philmore Pali, NP  ferrous sulfate 325 (65 FE) MG tablet Take 325 mg by mouth daily. 12/27/19  Yes [provider]  glipiZIDE (GLUCOTROL) 10 MG tablet Take 10 mg by mouth 2 (two) times daily. 02/22/20  Yes [provider]  latanoprost (XALATAN) 0.005 % ophthalmic solution Place 1 drop into both eyes at bedtime. 04/17/20  Yes [provider]  lisinopril (ZESTRIL) 40 MG tablet Take 40 mg by mouth daily. 03/15/20  Yes [provider]  metoprolol succinate (TOPROL-XL) 100 MG 24 hr tablet Take 100 mg by mouth daily. 12/23/18  Yes [provider]  pantoprazole (PROTONIX) 40 MG tablet Take 40 mg by mouth 2 (two) times daily. 12/18/19  Yes [provider]  famotidine (PEPCID) 20 MG tablet Take 1 tablet (20 mg total) by mouth 2 (two) times daily as needed (itching). 07/08/19   Nils Flack, Mina A, PA-C  triamcinolone cream (KENALOG) 0.1 % Apply 1 application topically 2 (two) times daily. Patient not taking: Reported on 05/05/2020 07/08/19   Rodell Perna A, PA-C    Allergies    Latex  Review of Systems   Review of Systems  Constitutional: Positive for chills and fever.  HENT: Positive for congestion and sore throat.   Eyes: Negative for pain and visual disturbance.  Respiratory: Positive for cough. Negative for shortness of breath.   Cardiovascular: Negative for chest pain and palpitations.  Gastrointestinal: Positive for abdominal pain, diarrhea and nausea. Negative for vomiting.  Genitourinary: Negative for difficulty urinating and frequency.  Musculoskeletal: Positive for arthralgias and myalgias.  Skin: Negative for color change and rash.  Neurological: Positive for headaches. Negative for syncope.  All other systems reviewed and are negative.   Physical Exam Updated Vital  Signs BP (!) 115/58 (BP Location: Left Arm)    Pulse 61    Temp 97.7 F (36.5 C) (Oral)    Resp 13    Ht 6\' 3"  (1.905 m)    Wt 84.8 kg    SpO2 96%    BMI 23.37 kg/m   Physical Exam Constitutional:      General: He is not in acute distress.    Comments: Shivering  HENT:     Head: Normocephalic and atraumatic.  Eyes:     Conjunctiva/sclera: Conjunctivae normal.     Pupils: Pupils are equal, round, and reactive to light.  Cardiovascular:     Rate and Rhythm: Normal rate and regular rhythm.  Pulmonary:     Effort: Pulmonary effort is normal. No respiratory distress.     Comments: 98% on room air Abdominal:     General: There is no distension.     Tenderness: There is no abdominal tenderness. There is no guarding. Negative signs include Murphy's sign.  Skin:    General: Skin is warm and dry.  Neurological:     General: No focal deficit present.     Mental Status: He is alert. Mental status is at baseline.  Psychiatric:        Mood and Affect: Mood normal.        Behavior: Behavior normal.     ED Results / Procedures / Treatments   Labs (all labs ordered are listed, but only abnormal results are displayed) Labs Reviewed  COMPREHENSIVE METABOLIC PANEL - Abnormal; Notable for the following components:      Result Value   CO2 21 (*)    BUN 29 (*)    Creatinine, Ser 1.80 (*)    Calcium 8.8 (*)    AST 57 (*)    GFR, Estimated 40 (*)    All other components within normal limits  CBC - Abnormal; Notable for the following components:   RBC 3.88 (*)    Hemoglobin 11.3 (*)    HCT 34.8 (*)    All other components within normal limits  C-REACTIVE PROTEIN - Abnormal; Notable for the following components:   CRP 6.1 (*)    All other components within normal limits  D-DIMER, QUANTITATIVE (NOT AT [facility]) - Abnormal; Notable for the following components:   D-Dimer, Quant 1.34 (*)    All other components within normal limits  FIBRINOGEN - Abnormal; Notable for the following components:    Fibrinogen 667 (*)    All other components within normal limits  POC SARS CORONAVIRUS 2 AG -  ED - Abnormal; Notable for the following components:   SARS Coronavirus 2 Ag POSITIVE (*)    All other components within normal limits  CULTURE, BLOOD (ROUTINE X 2)  CULTURE, BLOOD (ROUTINE X 2)  GASTROINTESTINAL PANEL BY PCR, STOOL (REPLACES STOOL CULTURE)  C DIFFICILE (CDIFF)  QUICK SCRN (NO PCR REFLEX)  LIPASE, BLOOD  FERRITIN  TRIGLYCERIDES  URINALYSIS, ROUTINE W REFLEX MICROSCOPIC  PROCALCITONIN  LACTATE DEHYDROGENASE  HEMOGLOBIN A1C  HIV ANTIBODY (ROUTINE TESTING W REFLEX)  CBC  CREATININE, SERUM  CBC WITH DIFFERENTIAL/PLATELET  COMPREHENSIVE METABOLIC PANEL  C-REACTIVE PROTEIN  D-DIMER, QUANTITATIVE (NOT AT [facility])  FERRITIN  MAGNESIUM  HEPATITIS PANEL, ACUTE  TROPONIN I (HIGH SENSITIVITY)    EKG EKG Interpretation  Date/Time:  Friday May 05 2020 15:02:28 EST Ventricular Rate:  82 PR Interval:    QRS Duration: 81 QT Interval:  383 QTC Calculation: 448 R Axis:   77 Text Interpretation: Sinus rhythm No STEMI Confirmed by Octaviano Glow [phone]) on 05/05/2020 3:33:45 PM   Radiology DG Abd 1 View  Result Date: 05/05/2020 CLINICAL DATA:  Abdominal pain and cough. EXAM: ABDOMEN - 1 VIEW COMPARISON:  12/01/2019 and prior. FINDINGS: Nonobstructive bowel gas pattern. No significant stool burden. No evidence of pneumoperitoneum. No suspicious calcific densities. Multilevel spondylosis. IMPRESSION: Nonobstructive bowel gas pattern. Electronically Signed   By: Primitivo Gauze M.D.   On: 05/05/2020 18:16   US RENAL  Result Date: 05/05/2020 CLINICAL DATA:  Initial evaluation for acute kidney injury. EXAM: RENAL / URINARY TRACT ULTRASOUND COMPLETE COMPARISON:  Prior MRI from 12/01/2019. FINDINGS: Right Kidney: Renal measurements: 10.2 x 4.3 x 6.4 cm = volume: 146.8 mL. Renal echogenicity within normal limits. No nephrolithiasis or hydronephrosis. 2.5 x 2.3 x 2.4 cm simple cyst  noted at the upper pole. Additional possible 1.8 x 1.7 x 1.3 cm simple cyst noted at the upper pole as well. Left Kidney: Renal measurements: 10.6 x 5.7 x 5.2 cm = volume: 164.3 mL. Renal echogenicity within normal limits. No nephrolithiasis or hydronephrosis. Few apparent cystic lesions measured by the ultrasound technologist are felt to be consistent with normal renal parenchyma. No definite focal renal mass identified. Bladder: Appears normal for degree of bladder distention. Other: None. IMPRESSION: 1. No hydronephrosis or other acute abnormality. Renal echogenicity within normal limits. 2. Few small simple right renal cysts as above. Electronically Signed   By: Jeannine Boga M.D.   On: 05/05/2020 19:21   DG Chest Portable 1 View  Result Date: 05/05/2020 CLINICAL DATA:  SOB, cough EXAM: PORTABLE CHEST 1 VIEW COMPARISON:  02/01/2020 and prior. FINDINGS: No pneumothorax or pleural effusion. Interstitial prominence with patchy basilar predominant opacities. Cardiomediastinal silhouette within normal limits. No acute osseous abnormality. IMPRESSION: Interstitial prominence and pulmonary opacities concerning for infection. Electronically Signed   By: Primitivo Gauze M.D.   On: 05/05/2020 14:22    Procedures Procedures (including critical care time)  Medications Ordered in ED Medications  remdesivir 200 mg in sodium chloride 0.9% 250 mL IVPB (0 mg Intravenous Stopped 05/05/20 1803)    Followed by  remdesivir 100 mg in sodium chloride 0.9 % 100 mL IVPB (has no administration in time range)  insulin aspart (novoLOG) injection 0-9 Units (has no administration in time range)  enoxaparin (LOVENOX) injection 40 mg (40 mg Subcutaneous Given 05/05/20 2155)  Ipratropium-Albuterol (COMBIVENT) respimat 1 puff (has no administration in time range)  dexamethasone (DECADRON) injection 6 mg (has no administration in time range)  guaiFENesin-dextromethorphan (ROBITUSSIN DM) 100-10 MG/5ML syrup 10 mL (has  no administration in time range)  chlorpheniramine-HYDROcodone (TUSSIONEX) 10-8 MG/5ML suspension 5 mL (has no administration in time range)  ascorbic acid (VITAMIN C) tablet 500 mg (500 mg Oral Given 05/05/20 2154)  zinc sulfate capsule 220 mg (220 mg Oral Given 05/05/20 2154)  acetaminophen (TYLENOL)  tablet 650 mg (has no administration in time range)  sodium chloride 0.9 % bolus 1,000 mL (0 mLs Intravenous Stopped 05/05/20 1512)  ondansetron (ZOFRAN) injection 4 mg (4 mg Intravenous Given 05/05/20 1429)  acetaminophen (TYLENOL) tablet 650 mg (650 mg Oral Given 05/05/20 1429)  dexamethasone (DECADRON) injection 8 mg (8 mg Intravenous Given 05/05/20 1515)  sodium chloride 0.9 % bolus 500 mL (0 mLs Intravenous Stopped 05/05/20 1811)    ED Course  I have reviewed the triage vital signs and the nursing notes.  Pertinent labs & imaging results that were available during my care of the patient were reviewed by me and considered in my medical decision making (see chart for details).  Likely viral URI syndrome  - high risk for Covid, we'll test here first with POC We'll also check for bacterial PNA, atypical CP, UTI evaluation  Abdominal cramping and diarrhea seems more consistent with viral syndrome than acute intraabdominal process   Labs reviewed  - WBC 4.1 - no leukocytosis.  CMP noteable only for some worsening of Cr, now 1.8 - last year 1.2.  BUN also elevated.  Maybe some dehydration here.  We'll give 1 L IVF, I spoke to him about getting this number rechecked by his doctor.  Electrolytes wnl. Lipase wnl  *  Covid positive here. Suspect this is the cause of his symptoms. Trop is 14.  Doubt this is ACS in the setting of his covid diagnosis.  Mohammad Granade was evaluated in Emergency Department on 05/05/2020 for the symptoms described in the history of present illness. He was evaluated in the context of the global COVID-19 pandemic, which necessitated consideration that the patient might be at  risk for infection with the SARS-CoV-2 virus that causes COVID-19. Institutional protocols and algorithms that pertain to the evaluation of patients at risk for COVID-19 are in a state of rapid change based on information released by regulatory bodies including the CDC and federal and state organizations. These policies and algorithms were followed during the patient's care in the ED.  Clinical Course as of 05/05/20 2159  Fri May 05, 2020  1436 SARS Coronavirus 2 Ag(!): POSITIVE [MT]  1526 Desaturation to 87% when sitting him up in bed and moving around, improves to 92% while at rest.  BP have been soft, but patient reports he is generally "low normal" and takes 2 BP meds, including one this morning.  We'll continue IV fluids, which have improved his HR, but I do not think this is shock at this time.  He is mentating well otherwise.  Discussed plan for admission given his hypoxia with exertion, his unvaccinated status, and the early course of his illness.  We'll order remdesivir as well as decardon. [MT]  1536 On toprol 100 mg daily and lisinopril 20 mg daily.  Took morning meds today [MT]  6283 Admitted to hospitalist - pt on 2L [place] [MT]    Clinical Course User Index [MT] Rejina Odle, Carola Rhine, MD    Final Clinical Impression(s) / ED Diagnoses Final diagnoses:  COVID-19  Hypoxia    Rx / DC Orders ED Discharge Orders    None       Wyvonnia Dusky, MD 05/05/20 2200

## 2020-05-05 NOTE — ED Notes (Signed)
Patty notified about BP.

## 2020-05-06 DIAGNOSIS — N179 Acute kidney failure, unspecified: Secondary | ICD-10-CM

## 2020-05-06 DIAGNOSIS — R0902 Hypoxemia: Secondary | ICD-10-CM

## 2020-05-06 DIAGNOSIS — R1013 Epigastric pain: Secondary | ICD-10-CM

## 2020-05-06 LAB — MAGNESIUM: Magnesium: 2.1 mg/dL (ref 1.7–2.4)

## 2020-05-06 LAB — GLUCOSE, CAPILLARY
Glucose-Capillary: 190 mg/dL — ABNORMAL HIGH (ref 70–99)
Glucose-Capillary: 178 mg/dL — ABNORMAL HIGH (ref 70–99)
Glucose-Capillary: 253 mg/dL — ABNORMAL HIGH (ref 70–99)
Glucose-Capillary: 226 mg/dL — ABNORMAL HIGH (ref 70–99)
Glucose-Capillary: 257 mg/dL — ABNORMAL HIGH (ref 70–99)

## 2020-05-06 LAB — COMPREHENSIVE METABOLIC PANEL
ALT: 36 U/L (ref 0–44)
AST: 50 U/L — ABNORMAL HIGH (ref 15–41)
Albumin: 2.9 g/dL — ABNORMAL LOW (ref 3.5–5.0)
Alkaline Phosphatase: 45 U/L (ref 38–126)
Anion gap: 7 (ref 5–15)
BUN: 23 mg/dL (ref 8–23)
CO2: 22 mmol/L (ref 22–32)
Calcium: 8.4 mg/dL — ABNORMAL LOW (ref 8.9–10.3)
Chloride: 111 mmol/L (ref 98–111)
Creatinine, Ser: 1.26 mg/dL — ABNORMAL HIGH (ref 0.61–1.24)
GFR, Estimated: >60 mL/min (ref >60)
Glucose, Bld: 229 mg/dL — ABNORMAL HIGH (ref 70–99)
Potassium: 4.1 mmol/L (ref 3.5–5.1)
Sodium: 140 mmol/L (ref 135–145)
Total Bilirubin: 0.6 mg/dL (ref 0.3–1.2)
Total Protein: 6 g/dL — ABNORMAL LOW (ref 6.5–8.1)

## 2020-05-06 LAB — URINALYSIS, ROUTINE W REFLEX MICROSCOPIC
Bacteria, UA: NONE SEEN
Bilirubin Urine: NEGATIVE
Glucose, UA: >=500 mg/dL — AB
Hgb urine dipstick: NEGATIVE
Ketones, ur: NEGATIVE mg/dL
Leukocytes,Ua: NEGATIVE
Nitrite: NEGATIVE
Protein, ur: NEGATIVE mg/dL
Specific Gravity, Urine: 1.017 (ref 1.005–1.030)
pH: 5 (ref 5.0–8.0)

## 2020-05-06 LAB — D-DIMER, QUANTITATIVE: D-Dimer, Quant: 1.05 ug/mL-FEU — ABNORMAL HIGH (ref 0.00–0.50)

## 2020-05-06 LAB — HEMOGLOBIN A1C
Hgb A1c MFr Bld: 8.5 % — ABNORMAL HIGH (ref 4.8–5.6)
Mean Plasma Glucose: 197.25 mg/dL

## 2020-05-06 LAB — HEPATITIS PANEL, ACUTE
HCV Ab: NONREACTIVE
Hep A IgM: NONREACTIVE
Hep B C IgM: NONREACTIVE
Hepatitis B Surface Ag: NONREACTIVE

## 2020-05-06 LAB — CBC WITH DIFFERENTIAL/PLATELET
Abs Immature Granulocytes: 0.02 10*3/uL (ref 0.00–0.07)
Basophils Absolute: 0 10*3/uL (ref 0.0–0.1)
Basophils Relative: 0 %
Eosinophils Absolute: 0 10*3/uL (ref 0.0–0.5)
Eosinophils Relative: 0 %
HCT: 30 % — ABNORMAL LOW (ref 39.0–52.0)
Hemoglobin: 9.5 g/dL — ABNORMAL LOW (ref 13.0–17.0)
Immature Granulocytes: 1 %
Lymphocytes Relative: 23 %
Lymphs Abs: 0.4 10*3/uL — ABNORMAL LOW (ref 0.7–4.0)
MCH: 28.5 pg (ref 26.0–34.0)
MCHC: 31.7 g/dL (ref 30.0–36.0)
MCV: 90.1 fL (ref 80.0–100.0)
Monocytes Absolute: 0.2 10*3/uL (ref 0.1–1.0)
Monocytes Relative: 11 %
Neutro Abs: 1.1 10*3/uL — ABNORMAL LOW (ref 1.7–7.7)
Neutrophils Relative %: 65 %
Platelets: 179 10*3/uL (ref 150–400)
RBC: 3.33 MIL/uL — ABNORMAL LOW (ref 4.22–5.81)
RDW: 13.2 % (ref 11.5–15.5)
WBC: 1.7 10*3/uL — ABNORMAL LOW (ref 4.0–10.5)
nRBC: 0 % (ref 0.0–0.2)

## 2020-05-06 LAB — C-REACTIVE PROTEIN: CRP: 7.3 mg/dL — ABNORMAL HIGH (ref <1.0)

## 2020-05-06 LAB — FERRITIN: Ferritin: 101 ng/mL (ref 24–336)

## 2020-05-06 LAB — HIV ANTIBODY (ROUTINE TESTING W REFLEX): HIV Screen 4th Generation wRfx: NONREACTIVE

## 2020-05-06 MED ORDER — LATANOPROST 0.005 % OP SOLN
1.0000 [drp] | Freq: Every day | OPHTHALMIC | Status: DC
  Administered 2020-05-06 – 2020-05-08 (×3): 1 [drp] via OPHTHALMIC
  Filled 2020-05-06: qty 2.5

## 2020-05-06 MED ORDER — CLOPIDOGREL BISULFATE 75 MG PO TABS
75.0000 mg | ORAL_TABLET | Freq: Every day | ORAL | Status: DC
  Administered 2020-05-07 – 2020-05-09 (×3): 75 mg via ORAL
  Filled 2020-05-06 (×3): qty 1

## 2020-05-06 MED ORDER — ATORVASTATIN CALCIUM 40 MG PO TABS
40.0000 mg | ORAL_TABLET | Freq: Every day | ORAL | Status: DC
  Administered 2020-05-06 – 2020-05-09 (×4): 40 mg via ORAL
  Filled 2020-05-06 (×5): qty 1

## 2020-05-06 MED ORDER — FERROUS SULFATE 325 (65 FE) MG PO TABS
325.0000 mg | ORAL_TABLET | Freq: Every day | ORAL | Status: DC
  Administered 2020-05-06 – 2020-05-09 (×4): 325 mg via ORAL
  Filled 2020-05-06 (×5): qty 1

## 2020-05-06 MED ORDER — PANTOPRAZOLE SODIUM 40 MG PO TBEC
40.0000 mg | DELAYED_RELEASE_TABLET | Freq: Two times a day (BID) | ORAL | Status: DC
  Administered 2020-05-06 – 2020-05-09 (×6): 40 mg via ORAL
  Filled 2020-05-06 (×6): qty 1

## 2020-05-06 MED ORDER — METOPROLOL SUCCINATE ER 100 MG PO TB24
100.0000 mg | ORAL_TABLET | Freq: Every day | ORAL | Status: DC
  Administered 2020-05-06 – 2020-05-09 (×4): 100 mg via ORAL
  Filled 2020-05-06 (×5): qty 1

## 2020-05-06 NOTE — Progress Notes (Signed)
PROGRESS NOTE  Elijah EstimableDonald Sandoval NWG:[id number] DOB: 07/18/1950 DOA: 05/05/2020 PCP: Rinaldo CloudHarwani, Mohan, MD   LOS: 1 day   Brief Narrative / Interim history: 70 year old male with history of DM2, HTN, came into the hospital with abdominal pain, shortness of breath, diarrhea. Symptoms began last week. Reports feeling progressive shortness of breath and having cough and fevers, also reports diminished appetite, symptoms of gotten to the point that he decided to come to the ED. He was found to be COVID-positive in the ED. Chest x-ray was concerning for pneumonia.  Subjective / 24h Interval events: He is doing well this morning, still feeling weak. Denies any significant shortness of breath. No chest pain.  Assessment & Plan: Principal Problem Acute hypoxic respiratory failure due to COVID-19 pneumonia -Patient was started on steroids, remdesivir, continue. Minimally hypoxic requiring 2 L, wean off as tolerated -Incentive spirometry, flutter valve  Active Problems Acute kidney injury -Creatinine as high as 1.8 on admission, likely due to dehydration, received IV fluids 1.2 this morning which is his baseline. Continue to monitor. -Hold lisinopril  Leukopenia -Likely in the setting of Covid infection, monitor  Iron deficiency anemia -Resume home iron  Essential hypertension -Continue metoprolol, hold lisinopril and amlodipine for now  Prior CVA -Resume home Plavix  Tobacco use, in remission -Patient smoked for 50 years, quit a month ago. Follows with pulmonology for a lung nodule  Scheduled Meds: . vitamin C  500 mg Oral Daily  . dexamethasone (DECADRON) injection  6 mg Intravenous Q24H  . enoxaparin (LOVENOX) injection  40 mg Subcutaneous Q24H  . insulin aspart  0-9 Units Subcutaneous TID WC  . Ipratropium-Albuterol  1 puff Inhalation Q6H  . zinc sulfate  220 mg Oral Daily   Continuous Infusions: . remdesivir 100 mg in NS 100 mL     PRN Meds:.acetaminophen,  chlorpheniramine-HYDROcodone, guaiFENesin-dextromethorphan  Diet Orders (From admission, onward)    Start     Ordered   05/05/20 1919  Diet Carb Modified Fluid consistency: Thin; Room service appropriate? Yes  Diet effective now       Question Answer Comment  Diet-HS Snack? Nothing   Calorie Level Medium 1600-2000   Fluid consistency: Thin   Room service appropriate? Yes      05/05/20 1918          DVT prophylaxis: enoxaparin (LOVENOX) injection 40 mg Start: 05/05/20 2200     Code Status: Full Code  Family Communication: attempted to call wife, went straight to voicemail  Status is: Inpatient  Remains inpatient appropriate because:Inpatient level of care appropriate due to severity of illness   Dispo: The patient is from: Home              Anticipated d/c is to: Home              Anticipated d/c date is: 2 days              Patient currently is not medically stable to d/c.   Difficult to place patient No  Consultants:  None   Procedures:  None   Microbiology  None   Antimicrobials: None     Objective: Vitals:   05/05/20 1917 05/05/20 2228 05/06/20 0234 05/06/20 1029  BP: (!) 115/58 133/62 122/67 111/62  Pulse: 61 67 (!) 58 69  Resp: 13 20 18 20   Temp:  97.9 F (36.6 C) 98 F (36.7 C) (!) 97.5 F (36.4 C)  TempSrc:    Oral  SpO2: 96% 96% 100% 97%  Weight:  81.8 kg    Height:  6\' 3"  (1.905 m)      Intake/Output Summary (Last 24 hours) at 05/06/2020 1033 Last data filed at 05/06/2020 1029 Gross per 24 hour  Intake 2268.81 ml  Output 250 ml  Net 2018.81 ml   Filed Weights   05/05/20 1003 05/05/20 2228  Weight: 84.8 kg 81.8 kg    Examination: Constitutional: NAD Eyes: no scleral icterus ENMT: Mucous membranes are moist.  Neck: normal, supple Respiratory: clear to auscultation bilaterally, no wheezing, no crackles. Normal respiratory effort. No accessory muscle use.  Cardiovascular: Regular rate and rhythm, no murmurs / rubs / gallops. No LE  edema.  Abdomen: non distended, no tenderness. Bowel sounds positive.  Musculoskeletal: no clubbing / cyanosis.  Skin: no rashes Neurologic: CN 2-12 grossly intact. Strength 5/5 in all 4.  Psychiatric: Normal judgment and insight. Alert and oriented x 3. Normal mood.    Data Reviewed: I have independently reviewed following labs and imaging studies   CBC: Recent Labs  Lab 05/05/20 1016 05/05/20 2151 05/06/20 0511  WBC 4.1 2.2* 1.7*  NEUTROABS  --   --  1.1*  HGB 11.3* 8.7* 9.5*  HCT 34.8* 26.6* 30.0*  MCV 89.7 89.9 90.1  PLT 212 152 676   Basic Metabolic Panel: Recent Labs  Lab 05/05/20 1016 05/05/20 2151 05/06/20 0511  NA 139  --  140  K 3.7  --  4.1  CL 106  --  111  CO2 21*  --  22  GLUCOSE 74  --  229*  BUN 29*  --  23  CREATININE 1.80* 1.36* 1.26*  CALCIUM 8.8*  --  8.4*  MG  --   --  2.1   Liver Function Tests: Recent Labs  Lab 05/05/20 1016 05/06/20 0511  AST 57* 50*  ALT 33 36  ALKPHOS 56 45  BILITOT 0.7 0.6  PROT 7.3 6.0*  ALBUMIN 3.9 2.9*   Coagulation Profile: No results for input(s): INR, PROTIME in the last 168 hours. HbA1C: No results for input(s): HGBA1C in the last 72 hours. CBG: Recent Labs  Lab 05/06/20 0725  GLUCAP 190*    No results found for this or any previous visit (from the past 240 hour(s)).   Radiology Studies: DG Abd 1 View  Result Date: 05/05/2020 CLINICAL DATA:  Abdominal pain and cough. EXAM: ABDOMEN - 1 VIEW COMPARISON:  12/01/2019 and prior. FINDINGS: Nonobstructive bowel gas pattern. No significant stool burden. No evidence of pneumoperitoneum. No suspicious calcific densities. Multilevel spondylosis. IMPRESSION: Nonobstructive bowel gas pattern. Electronically Signed   By: Primitivo Gauze M.D.   On: 05/05/2020 18:16   US RENAL  Result Date: 05/05/2020 CLINICAL DATA:  Initial evaluation for acute kidney injury. EXAM: RENAL / URINARY TRACT ULTRASOUND COMPLETE COMPARISON:  Prior MRI from 12/01/2019. FINDINGS:  Right Kidney: Renal measurements: 10.2 x 4.3 x 6.4 cm = volume: 146.8 mL. Renal echogenicity within normal limits. No nephrolithiasis or hydronephrosis. 2.5 x 2.3 x 2.4 cm simple cyst noted at the upper pole. Additional possible 1.8 x 1.7 x 1.3 cm simple cyst noted at the upper pole as well. Left Kidney: Renal measurements: 10.6 x 5.7 x 5.2 cm = volume: 164.3 mL. Renal echogenicity within normal limits. No nephrolithiasis or hydronephrosis. Few apparent cystic lesions measured by the ultrasound technologist are felt to be consistent with normal renal parenchyma. No definite focal renal mass identified. Bladder: Appears normal for degree of bladder distention. Other: None. IMPRESSION: 1. No hydronephrosis or other acute  abnormality. Renal echogenicity within normal limits. 2. Few small simple right renal cysts as above. Electronically Signed   By: Jeannine Boga M.D.   On: 05/05/2020 19:21   DG Chest Portable 1 View  Result Date: 05/05/2020 CLINICAL DATA:  SOB, cough EXAM: PORTABLE CHEST 1 VIEW COMPARISON:  02/01/2020 and prior. FINDINGS: No pneumothorax or pleural effusion. Interstitial prominence with patchy basilar predominant opacities. Cardiomediastinal silhouette within normal limits. No acute osseous abnormality. IMPRESSION: Interstitial prominence and pulmonary opacities concerning for infection. Electronically Signed   By: Primitivo Gauze M.D.   On: 05/05/2020 14:22   Marzetta Board, MD, PhD [facility]  Between 7 am - 7 pm I am available, please contact me via Amion or Securechat  Between 7 pm - 7 am I am not available, please contact night coverage MD/APP via Amion

## 2020-05-07 LAB — CBC WITH DIFFERENTIAL/PLATELET
Abs Immature Granulocytes: 0.03 10*3/uL (ref 0.00–0.07)
Basophils Absolute: 0 10*3/uL (ref 0.0–0.1)
Basophils Relative: 0 %
Eosinophils Absolute: 0 10*3/uL (ref 0.0–0.5)
Eosinophils Relative: 0 %
HCT: 28.2 % — ABNORMAL LOW (ref 39.0–52.0)
Hemoglobin: 9.4 g/dL — ABNORMAL LOW (ref 13.0–17.0)
Immature Granulocytes: 1 %
Lymphocytes Relative: 14 %
Lymphs Abs: 0.5 10*3/uL — ABNORMAL LOW (ref 0.7–4.0)
MCH: 29.4 pg (ref 26.0–34.0)
MCHC: 33.3 g/dL (ref 30.0–36.0)
MCV: 88.1 fL (ref 80.0–100.0)
Monocytes Absolute: 0.5 10*3/uL (ref 0.1–1.0)
Monocytes Relative: 12 %
Neutro Abs: 2.8 10*3/uL (ref 1.7–7.7)
Neutrophils Relative %: 73 %
Platelets: 216 10*3/uL (ref 150–400)
RBC: 3.2 MIL/uL — ABNORMAL LOW (ref 4.22–5.81)
RDW: 13.2 % (ref 11.5–15.5)
WBC: 3.8 10*3/uL — ABNORMAL LOW (ref 4.0–10.5)
nRBC: 0 % (ref 0.0–0.2)

## 2020-05-07 LAB — COMPREHENSIVE METABOLIC PANEL
ALT: 29 U/L (ref 0–44)
AST: 37 U/L (ref 15–41)
Albumin: 2.8 g/dL — ABNORMAL LOW (ref 3.5–5.0)
Alkaline Phosphatase: 50 U/L (ref 38–126)
Anion gap: 7 (ref 5–15)
BUN: 24 mg/dL — ABNORMAL HIGH (ref 8–23)
CO2: 22 mmol/L (ref 22–32)
Calcium: 8.5 mg/dL — ABNORMAL LOW (ref 8.9–10.3)
Chloride: 112 mmol/L — ABNORMAL HIGH (ref 98–111)
Creatinine, Ser: 1.18 mg/dL (ref 0.61–1.24)
GFR, Estimated: >60 mL/min (ref >60)
Glucose, Bld: 245 mg/dL — ABNORMAL HIGH (ref 70–99)
Potassium: 4.3 mmol/L (ref 3.5–5.1)
Sodium: 141 mmol/L (ref 135–145)
Total Bilirubin: 0.5 mg/dL (ref 0.3–1.2)
Total Protein: 5.7 g/dL — ABNORMAL LOW (ref 6.5–8.1)

## 2020-05-07 LAB — C-REACTIVE PROTEIN: CRP: 4.4 mg/dL — ABNORMAL HIGH (ref <1.0)

## 2020-05-07 LAB — LIPASE, BLOOD: Lipase: 36 U/L (ref 11–51)

## 2020-05-07 LAB — GLUCOSE, CAPILLARY
Glucose-Capillary: 201 mg/dL — ABNORMAL HIGH (ref 70–99)
Glucose-Capillary: 154 mg/dL — ABNORMAL HIGH (ref 70–99)
Glucose-Capillary: 201 mg/dL — ABNORMAL HIGH (ref 70–99)
Glucose-Capillary: 226 mg/dL — ABNORMAL HIGH (ref 70–99)

## 2020-05-07 LAB — D-DIMER, QUANTITATIVE: D-Dimer, Quant: 1.03 ug/mL-FEU — ABNORMAL HIGH (ref 0.00–0.50)

## 2020-05-07 LAB — MAGNESIUM: Magnesium: 2.2 mg/dL (ref 1.7–2.4)

## 2020-05-07 LAB — FERRITIN: Ferritin: 113 ng/mL (ref 24–336)

## 2020-05-07 NOTE — Progress Notes (Signed)
PROGRESS NOTE  Elijah Sandoval [id number] DOB: 29-Sep-1950 DOA: 05/05/2020 PCP: Charolette Forward, MD   LOS: 2 days   Brief Narrative / Interim history: 70 year old male with history of DM2, HTN, came into the hospital with abdominal pain, shortness of breath, diarrhea. Symptoms began last week. Reports feeling progressive shortness of breath and having cough and fevers, also reports diminished appetite, symptoms of gotten to the point that he decided to come to the ED. He was found to be COVID-positive in the ED. Chest x-ray was concerning for pneumonia.  Subjective / 24h Interval events: Sitting in the chair, eating breakfast.  Tells me he is doing much better  Assessment & Plan: Principal Problem Acute hypoxic respiratory failure due to COVID-19 pneumonia -Patient was started on steroids, remdesivir, continue. Minimally hypoxic requiring 2 L, wean off as tolerated -Incentive spirometry, flutter valve -Still on oxygen this morning, attempt to wean to room air  Active Problems Acute kidney injury -Creatinine as high as 1.8 on admission, likely due to dehydration, received IV fluids. Continue to monitor. -Hold lisinopril -Creatinine now normalized  Leukopenia -Likely in the setting of Covid infection, monitor  Iron deficiency anemia -Resume home iron  Essential hypertension -Continue metoprolol, hold lisinopril and amlodipine for now  Prior CVA -Resume home Plavix  Tobacco use, in remission -Patient smoked for 50 years, quit a month ago. Follows with pulmonology for a lung nodule.  I would not be surprised if he will need oxygen on discharge  Scheduled Meds:  vitamin C  500 mg Oral Daily   atorvastatin  40 mg Oral Daily   clopidogrel  75 mg Oral Q breakfast   dexamethasone (DECADRON) injection  6 mg Intravenous Q24H   enoxaparin (LOVENOX) injection  40 mg Subcutaneous Q24H   ferrous sulfate  325 mg Oral Daily   insulin aspart  0-9 Units Subcutaneous TID WC    Ipratropium-Albuterol  1 puff Inhalation Q6H   latanoprost  1 drop Both Eyes QHS   metoprolol succinate  100 mg Oral Daily   pantoprazole  40 mg Oral BID   zinc sulfate  220 mg Oral Daily   Continuous Infusions:  remdesivir 100 mg in NS 100 mL 100 mg (05/07/20 0922)   PRN Meds:.acetaminophen, chlorpheniramine-HYDROcodone, guaiFENesin-dextromethorphan  Diet Orders (From admission, onward)    Start     Ordered   05/05/20 1919  Diet Carb Modified Fluid consistency: Thin; Room service appropriate? Yes  Diet effective now       Question Answer Comment  Diet-HS Snack? Nothing   Calorie Level Medium 1600-2000   Fluid consistency: Thin   Room service appropriate? Yes      05/05/20 1918          DVT prophylaxis: enoxaparin (LOVENOX) injection 40 mg Start: 05/05/20 2200     Code Status: Full Code  Family Communication: attempted to call wife, went straight to voicemail  Status is: Inpatient  Remains inpatient appropriate because:Inpatient level of care appropriate due to severity of illness   Dispo: The patient is from: Home              Anticipated d/c is to: Home              Anticipated d/c date is: 2 days              Patient currently is not medically stable to d/c.   Difficult to place patient No  Consultants:  None   Procedures:  None   Microbiology  None  Antimicrobials: None     Objective: Vitals:   05/06/20 1707 05/06/20 2007 05/07/20 0511 05/07/20 0909  BP: 136/63 115/63 131/63 (!) 141/66  Pulse: 71 68 (!) 54 (!) 55  Resp: 18 19 20  (!) 21  Temp: 98.6 F (37 C) (!) 97.4 F (36.3 C) 97.7 F (36.5 C) 97.8 F (36.6 C)  TempSrc: Oral Oral Oral Oral  SpO2: 98% 98% 100% 96%  Weight:      Height:        Intake/Output Summary (Last 24 hours) at 05/07/2020 1123 Last data filed at 05/07/2020 7341 Gross per 24 hour  Intake 820 ml  Output 800 ml  Net 20 ml   Filed Weights   05/05/20 1003 05/05/20 2228  Weight: 84.8 kg 81.8 kg     Examination: Constitutional: No distress, in chair Eyes: No scleral icterus ENMT: Moist mucous membranes Neck: normal, supple Respiratory: Clear bilaterally, no wheezing, no crackles, normal respiratory effort Cardiovascular: Regular rate and rhythm, no murmurs, no edema Abdomen: Soft, NT, ND, bowel sounds positive Musculoskeletal: no clubbing / cyanosis.  Skin: No rashes seen Neurologic: No focal deficits   Data Reviewed: I have independently reviewed following labs and imaging studies   CBC: Recent Labs  Lab 05/05/20 1016 05/05/20 2151 05/06/20 0511 05/07/20 0338  WBC 4.1 2.2* 1.7* 3.8*  NEUTROABS  --   --  1.1* 2.8  HGB 11.3* 8.7* 9.5* 9.4*  HCT 34.8* 26.6* 30.0* 28.2*  MCV 89.7 89.9 90.1 88.1  PLT 212 152 179 937   Basic Metabolic Panel: Recent Labs  Lab 05/05/20 1016 05/05/20 2151 05/06/20 0511 05/07/20 0338  NA 139  --  140 141  K 3.7  --  4.1 4.3  CL 106  --  111 112*  CO2 21*  --  22 22  GLUCOSE 74  --  229* 245*  BUN 29*  --  23 24*  CREATININE 1.80* 1.36* 1.26* 1.18  CALCIUM 8.8*  --  8.4* 8.5*  MG  --   --  2.1 2.2   Liver Function Tests: Recent Labs  Lab 05/05/20 1016 05/06/20 0511 05/07/20 0338  AST 57* 50* 37  ALT 33 36 29  ALKPHOS 56 45 50  BILITOT 0.7 0.6 0.5  PROT 7.3 6.0* 5.7*  ALBUMIN 3.9 2.9* 2.8*   Coagulation Profile: No results for input(s): INR, PROTIME in the last 168 hours. HbA1C: Recent Labs    05/05/20 2151  HGBA1C 8.5*   CBG: Recent Labs  Lab 05/06/20 1130 05/06/20 1628 05/06/20 1702 05/06/20 2011 05/07/20 0737  GLUCAP 178* 253* 226* 257* 201*    Recent Results (from the past 240 hour(s))  Blood Culture (routine x 2)     Status: None (Preliminary result)   Collection Time: 05/05/20  3:57 PM   Specimen: Right Antecubital; Blood  Result Value Ref Range Status   Specimen Description   Final    RIGHT ANTECUBITAL Performed at [facility], [place] [address] [place], [place] [address]     Special Requests   Final    BOTTLES DRAWN AEROBIC AND ANAEROBIC Blood Culture adequate volume Performed at [place] [address], [place], [place] [address]    Culture   Final    NO GROWTH 1 DAY Performed at [facility], [place] [address], [place], [place] [address]    Report Status PENDING  Incomplete  Blood Culture (routine x 2)     Status: None (Preliminary result)   Collection  Time: 05/06/20  5:11 AM   Specimen: BLOOD  Result Value Ref Range Status   Specimen Description   Final    BLOOD RIGHT ANTECUBITAL Performed at [place] [address] [place], [place] [address]    Special Requests   Final    BOTTLES DRAWN AEROBIC ONLY Blood Culture adequate volume Performed at [facility] [address] [place], [place] [address]    Culture   Final    NO GROWTH 1 DAY Performed at [facility], [place] [address], [place],  [address]    Report Status PENDING  Incomplete     Radiology Studies: No results found. Marzetta Board, MD, PhD [facility]  Between 7 am - 7 pm I am available, please contact me via Amion or Securechat  Between 7 pm - 7 am I am not available, please contact night coverage MD/APP via Amion

## 2020-05-07 NOTE — Plan of Care (Signed)
  Problem: Education: Goal: Knowledge of risk factors and measures for prevention of condition will improve Outcome: Progressing   Problem: Coping: Goal: Psychosocial and spiritual needs will be supported Outcome: Progressing   Problem: Respiratory: Goal: Will maintain a patent airway Outcome: Progressing Goal: Complications related to the disease process, condition or treatment will be avoided or minimized Outcome: Progressing   

## 2020-05-07 NOTE — Progress Notes (Signed)
SATURATION QUALIFICATIONS: (This note is used to comply with regulatory documentation for home oxygen)  Patient Saturations on Room Air at Rest = 93%  Patient Saturations on Room Air while Ambulating = 84%  Patient Saturations on 2 Liters of oxygen while Ambulating = 95%  Please briefly explain why patient needs home oxygen: Patient unable to maintain adequate oxygenation when ambulating

## 2020-05-08 LAB — CBC WITH DIFFERENTIAL/PLATELET
Abs Immature Granulocytes: 0.03 10*3/uL (ref 0.00–0.07)
Basophils Absolute: 0 10*3/uL (ref 0.0–0.1)
Basophils Relative: 0 %
Eosinophils Absolute: 0 10*3/uL (ref 0.0–0.5)
Eosinophils Relative: 0 %
HCT: 30.3 % — ABNORMAL LOW (ref 39.0–52.0)
Hemoglobin: 9.9 g/dL — ABNORMAL LOW (ref 13.0–17.0)
Immature Granulocytes: 1 %
Lymphocytes Relative: 18 %
Lymphs Abs: 0.7 10*3/uL (ref 0.7–4.0)
MCH: 28.9 pg (ref 26.0–34.0)
MCHC: 32.7 g/dL (ref 30.0–36.0)
MCV: 88.3 fL (ref 80.0–100.0)
Monocytes Absolute: 0.5 10*3/uL (ref 0.1–1.0)
Monocytes Relative: 11 %
Neutro Abs: 2.9 10*3/uL (ref 1.7–7.7)
Neutrophils Relative %: 70 %
Platelets: 266 10*3/uL (ref 150–400)
RBC: 3.43 MIL/uL — ABNORMAL LOW (ref 4.22–5.81)
RDW: 13 % (ref 11.5–15.5)
WBC: 4.1 10*3/uL (ref 4.0–10.5)
nRBC: 0 % (ref 0.0–0.2)

## 2020-05-08 LAB — COMPREHENSIVE METABOLIC PANEL
ALT: 29 U/L (ref 0–44)
AST: 38 U/L (ref 15–41)
Albumin: 3 g/dL — ABNORMAL LOW (ref 3.5–5.0)
Alkaline Phosphatase: 48 U/L (ref 38–126)
Anion gap: 10 (ref 5–15)
BUN: 23 mg/dL (ref 8–23)
CO2: 23 mmol/L (ref 22–32)
Calcium: 8.7 mg/dL — ABNORMAL LOW (ref 8.9–10.3)
Chloride: 110 mmol/L (ref 98–111)
Creatinine, Ser: 1.06 mg/dL (ref 0.61–1.24)
GFR, Estimated: >60 mL/min (ref >60)
Glucose, Bld: 182 mg/dL — ABNORMAL HIGH (ref 70–99)
Potassium: 4 mmol/L (ref 3.5–5.1)
Sodium: 143 mmol/L (ref 135–145)
Total Bilirubin: 0.4 mg/dL (ref 0.3–1.2)
Total Protein: 5.7 g/dL — ABNORMAL LOW (ref 6.5–8.1)

## 2020-05-08 LAB — C-REACTIVE PROTEIN: CRP: 1.9 mg/dL — ABNORMAL HIGH (ref <1.0)

## 2020-05-08 LAB — FERRITIN: Ferritin: 104 ng/mL (ref 24–336)

## 2020-05-08 LAB — D-DIMER, QUANTITATIVE: D-Dimer, Quant: 1.06 ug/mL-FEU — ABNORMAL HIGH (ref 0.00–0.50)

## 2020-05-08 LAB — GLUCOSE, CAPILLARY
Glucose-Capillary: 124 mg/dL — ABNORMAL HIGH (ref 70–99)
Glucose-Capillary: 148 mg/dL — ABNORMAL HIGH (ref 70–99)
Glucose-Capillary: 240 mg/dL — ABNORMAL HIGH (ref 70–99)
Glucose-Capillary: 194 mg/dL — ABNORMAL HIGH (ref 70–99)

## 2020-05-08 LAB — MAGNESIUM: Magnesium: 2.3 mg/dL (ref 1.7–2.4)

## 2020-05-08 NOTE — Progress Notes (Signed)
SATURATION QUALIFICATIONS: (This note is used to comply with regulatory documentation for home oxygen)  Patient Saturations on Room Air at Rest = 90%  Patient Saturations on Room Air while Ambulating = 84%  Patient Saturations on 2 Liters of oxygen while Ambulating = 92%  Please briefly explain why patient needs home oxygen:   low oxygenation on room air at rest and while ambulating.

## 2020-05-08 NOTE — Progress Notes (Signed)
PROGRESS NOTE  Elijah Sandoval [id number]:[id number] DOB: 1951-03-26 DOA: 05/05/2020 PCP: Charolette Forward, MD   LOS: 3 days   Brief Narrative / Interim history: 70 year old male with history of DM2, HTN, came into the hospital with abdominal pain, shortness of breath, diarrhea. Symptoms began last week. Reports feeling progressive shortness of breath and having cough and fevers, also reports diminished appetite, symptoms of gotten to the point that he decided to come to the ED. He was found to be COVID-positive in the ED. Chest x-ray was concerning for pneumonia.  Subjective / 24h Interval events: Feels better, improved  Assessment & Plan: Principal Problem Acute hypoxic respiratory failure due to COVID-19 pneumonia -Patient was started on steroids, remdesivir, continue. Minimally hypoxic requiring 2 L, wean off as tolerated -Incentive spirometry, flutter valve -Still on oxygen this morning, attempt to wean to room air, but still desatting with ambulation, will keep 1 more day to finish Remdesivir tomorrow.  Active Problems Acute kidney injury -Creatinine as high as 1.8 on admission, likely due to dehydration, received IV fluids. Continue to monitor. -Hold lisinopril -Creatinine normal this morning  Leukopenia -Likely in the setting of Covid infection, improving this morning  Iron deficiency anemia -Resume home iron  Essential hypertension -Continue metoprolol, hold lisinopril and amlodipine for now, remains normotensive  Prior CVA -Resume home Plavix  Tobacco use, in remission -Patient smoked for 50 years, quit a month ago. Follows with pulmonology for a lung nodule.  I would not be surprised if he will need oxygen on discharge  Scheduled Meds: . vitamin C  500 mg Oral Daily  . atorvastatin  40 mg Oral Daily  . clopidogrel  75 mg Oral Q breakfast  . dexamethasone (DECADRON) injection  6 mg Intravenous Q24H  . enoxaparin (LOVENOX) injection  40 mg Subcutaneous Q24H  . ferrous  sulfate  325 mg Oral Daily  . insulin aspart  0-9 Units Subcutaneous TID WC  . Ipratropium-Albuterol  1 puff Inhalation Q6H  . latanoprost  1 drop Both Eyes QHS  . metoprolol succinate  100 mg Oral Daily  . pantoprazole  40 mg Oral BID  . zinc sulfate  220 mg Oral Daily   Continuous Infusions: . remdesivir 100 mg in NS 100 mL 100 mg (05/08/20 0933)   PRN Meds:.acetaminophen, chlorpheniramine-HYDROcodone, guaiFENesin-dextromethorphan  Diet Orders (From admission, onward)    Start     Ordered   05/05/20 1919  Diet Carb Modified Fluid consistency: Thin; Room service appropriate? Yes  Diet effective now       Question Answer Comment  Diet-HS Snack? Nothing   Calorie Level Medium 1600-2000   Fluid consistency: Thin   Room service appropriate? Yes      05/05/20 1918          DVT prophylaxis: enoxaparin (LOVENOX) injection 40 mg Start: 05/05/20 2200     Code Status: Full Code  Family Communication: attempted to call wife, went straight to voicemail  Status is: Inpatient  Remains inpatient appropriate because:Inpatient level of care appropriate due to severity of illness   Dispo: The patient is from: Home              Anticipated d/c is to: Home              Anticipated d/c date is: 2 days              Patient currently is not medically stable to d/c.   Difficult to place patient No  Consultants:  None  Procedures:  None   Microbiology  None   Antimicrobials: None     Objective: Vitals:   05/07/20 1246 05/07/20 1322 05/07/20 2017 05/08/20 0531  BP:  136/66 140/67 134/60  Pulse:  65 (!) 56 (!) 59  Resp:  18 20 19   Temp:  98 F (36.7 C) 97.8 F (36.6 C) (!) 97.5 F (36.4 C)  TempSrc:  Oral Oral Oral  SpO2: 94% 92% 97% 98%  Weight:      Height:        Intake/Output Summary (Last 24 hours) at 05/08/2020 1240 Last data filed at 05/08/2020 1000 Gross per 24 hour  Intake 960 ml  Output 125 ml  Net 835 ml   Filed Weights   05/05/20 1003 05/05/20 2228   Weight: 84.8 kg 81.8 kg    Examination: Constitutional: No distress, in bed  Eyes: No scleral icterus ENMT: mmm Neck: normal, supple Respiratory: Clear bilaterally without wheezing or crackles, normal respiratory effort Cardiovascular: Regular rate and rhythm, no murmurs, no edema Abdomen: Soft, nontender, nondistended, bowel sounds positive Musculoskeletal: no clubbing / cyanosis.  Skin: No rashes seen Neurologic: Nonfocal   Data Reviewed: I have independently reviewed following labs and imaging studies   CBC: Recent Labs  Lab 05/05/20 1016 05/05/20 2151 05/06/20 0511 05/07/20 0338 05/08/20 0346  WBC 4.1 2.2* 1.7* 3.8* 4.1  NEUTROABS  --   --  1.1* 2.8 2.9  HGB 11.3* 8.7* 9.5* 9.4* 9.9*  HCT 34.8* 26.6* 30.0* 28.2* 30.3*  MCV 89.7 89.9 90.1 88.1 88.3  PLT 212 152 179 216 [id number]   Basic Metabolic Panel: Recent Labs  Lab 05/05/20 1016 05/05/20 2151 05/06/20 0511 05/07/20 0338 05/08/20 0346  NA 139  --  140 141 143  K 3.7  --  4.1 4.3 4.0  CL 106  --  111 112* 110  CO2 21*  --  22 22 23   GLUCOSE 74  --  229* 245* 182*  BUN 29*  --  23 24* 23  CREATININE 1.80* 1.36* 1.26* 1.18 1.06  CALCIUM 8.8*  --  8.4* 8.5* 8.7*  MG  --   --  2.1 2.2 2.3   Liver Function Tests: Recent Labs  Lab 05/05/20 1016 05/06/20 0511 05/07/20 0338 05/08/20 0346  AST 57* 50* 37 38  ALT 33 36 29 29  ALKPHOS 56 45 50 48  BILITOT 0.7 0.6 0.5 0.4  PROT 7.3 6.0* 5.7* 5.7*  ALBUMIN 3.9 2.9* 2.8* 3.0*   Coagulation Profile: No results for input(s): INR, PROTIME in the last 168 hours. HbA1C: Recent Labs    05/05/20 2151  HGBA1C 8.5*   CBG: Recent Labs  Lab 05/07/20 1141 05/07/20 1617 05/07/20 2020 05/08/20 0848 05/08/20 1152  GLUCAP 154* 201* 226* 124* 148*    Recent Results (from the past 240 hour(s))  Blood Culture (routine x 2)     Status: None (Preliminary result)   Collection Time: 05/05/20  3:57 PM   Specimen: Right Antecubital; Blood  Result Value Ref Range  Status   Specimen Description   Final    RIGHT ANTECUBITAL Performed at [facility], [place] [address] [place], [place] [address]    Special Requests   Final    BOTTLES DRAWN AEROBIC AND ANAEROBIC Blood Culture adequate volume Performed at [place] [address], [place],  [address]    Culture   Final    NO GROWTH 1 DAY Performed at [facility], [place]  [address] [place], [place] [address]    Report Status PENDING  Incomplete  Blood Culture (routine x 2)     Status: None (Preliminary result)   Collection Time: 05/06/20  5:11 AM   Specimen: BLOOD  Result Value Ref Range Status   Specimen Description   Final    BLOOD RIGHT ANTECUBITAL Performed at [place] [facility] [place] [address], [place], [place] [address]    Special Requests   Final    BOTTLES DRAWN AEROBIC ONLY Blood Culture adequate volume Performed at [place] [address], [place], [place] [address]    Culture   Final    NO GROWTH 1 DAY Performed at [facility], [place] [address], [place], [place] [address]    Report Status PENDING  Incomplete     Radiology Studies: No results found. Marzetta Board, MD, PhD [facility]  Between 7 am - 7 pm I am available, please contact me via Amion or Securechat  Between 7 pm - 7 am I am not available, please contact night coverage MD/APP via Amion

## 2020-05-08 NOTE — Care Management Important Message (Signed)
Medicare important message printed for Elijah Sandoval to give to the patient. 

## 2020-05-08 NOTE — Plan of Care (Signed)
  Problem: Education: Goal: Knowledge of risk factors and measures for prevention of condition will improve Outcome: Progressing   Problem: Coping: Goal: Psychosocial and spiritual needs will be supported Outcome: Progressing   Problem: Respiratory: Goal: Will maintain a patent airway Outcome: Progressing Goal: Complications related to the disease process, condition or treatment will be avoided or minimized Outcome: Progressing   

## 2020-05-09 LAB — CBC WITH DIFFERENTIAL/PLATELET
Abs Immature Granulocytes: 0.03 10*3/uL (ref 0.00–0.07)
Basophils Absolute: 0 10*3/uL (ref 0.0–0.1)
Basophils Relative: 0 %
Eosinophils Absolute: 0 10*3/uL (ref 0.0–0.5)
Eosinophils Relative: 0 %
HCT: 31.9 % — ABNORMAL LOW (ref 39.0–52.0)
Hemoglobin: 10.4 g/dL — ABNORMAL LOW (ref 13.0–17.0)
Immature Granulocytes: 1 %
Lymphocytes Relative: 17 %
Lymphs Abs: 0.7 10*3/uL (ref 0.7–4.0)
MCH: 28.8 pg (ref 26.0–34.0)
MCHC: 32.6 g/dL (ref 30.0–36.0)
MCV: 88.4 fL (ref 80.0–100.0)
Monocytes Absolute: 0.4 10*3/uL (ref 0.1–1.0)
Monocytes Relative: 10 %
Neutro Abs: 3 10*3/uL (ref 1.7–7.7)
Neutrophils Relative %: 72 %
Platelets: 299 10*3/uL (ref 150–400)
RBC: 3.61 MIL/uL — ABNORMAL LOW (ref 4.22–5.81)
RDW: 12.9 % (ref 11.5–15.5)
WBC: 4.2 10*3/uL (ref 4.0–10.5)
nRBC: 0 % (ref 0.0–0.2)

## 2020-05-09 LAB — COMPREHENSIVE METABOLIC PANEL
ALT: 34 U/L (ref 0–44)
AST: 43 U/L — ABNORMAL HIGH (ref 15–41)
Albumin: 3.2 g/dL — ABNORMAL LOW (ref 3.5–5.0)
Alkaline Phosphatase: 56 U/L (ref 38–126)
Anion gap: 11 (ref 5–15)
BUN: 21 mg/dL (ref 8–23)
CO2: 23 mmol/L (ref 22–32)
Calcium: 8.7 mg/dL — ABNORMAL LOW (ref 8.9–10.3)
Chloride: 108 mmol/L (ref 98–111)
Creatinine, Ser: 1.1 mg/dL (ref 0.61–1.24)
GFR, Estimated: >60 mL/min (ref >60)
Glucose, Bld: 178 mg/dL — ABNORMAL HIGH (ref 70–99)
Potassium: 3.5 mmol/L (ref 3.5–5.1)
Sodium: 142 mmol/L (ref 135–145)
Total Bilirubin: 0.6 mg/dL (ref 0.3–1.2)
Total Protein: 6.4 g/dL — ABNORMAL LOW (ref 6.5–8.1)

## 2020-05-09 LAB — D-DIMER, QUANTITATIVE: D-Dimer, Quant: 1.26 ug/mL-FEU — ABNORMAL HIGH (ref 0.00–0.50)

## 2020-05-09 LAB — GLUCOSE, CAPILLARY
Glucose-Capillary: 154 mg/dL — ABNORMAL HIGH (ref 70–99)
Glucose-Capillary: 142 mg/dL — ABNORMAL HIGH (ref 70–99)

## 2020-05-09 LAB — MAGNESIUM: Magnesium: 2.1 mg/dL (ref 1.7–2.4)

## 2020-05-09 LAB — FERRITIN: Ferritin: 84 ng/mL (ref 24–336)

## 2020-05-09 LAB — C-REACTIVE PROTEIN: CRP: 1.7 mg/dL — ABNORMAL HIGH (ref <1.0)

## 2020-05-09 MED ORDER — DEXAMETHASONE 6 MG PO TABS: 6.0000 mg | ORAL_TABLET | Freq: Every day | ORAL | 0 refills | Status: AC

## 2020-05-09 MED ORDER — GUAIFENESIN-DM 100-10 MG/5ML PO SYRP: 10.0000 mL | ORAL_SOLUTION | ORAL | 0 refills | Status: DC | PRN

## 2020-05-09 NOTE — Discharge Summary (Signed)
Physician Discharge Summary  Elijah Sandoval YWV:[id number] DOB: 10-07-1950 DOA: 05/05/2020  PCP: Charolette Forward, MD  Admit date: 05/05/2020 Discharge date: 05/09/2020  Admitted From: home Disposition:  home  Recommendations for Outpatient Follow-up:  1. Follow up with PCP in 1-2 weeks  Home Health: none Equipment/Devices: home O2, 2L  Discharge Condition: stable CODE STATUS: Full code Diet recommendation: diabetic   HPI: Per admitting MD, Elijah Sandoval is a 70 y.o. male with medical history significant of DM2, HTN. Presenting with stomach pain, dyspnea, and diarrhea. He reports that his symptoms began last week. It started with a  Global abdominal pain and diarrhea. He felt "tightness" throughout his stomach. He tried OTC meds, but they did not help. He reports feeling progressively short of breath and having cough/fever over that time. Again, he tried OTC meds to help, but thy did not. He symptoms got to the point where he was not tolerating a diet. No active vomiting, just no appetite. He decided that it was time to come to the ED. He denies any other aggravating or alleviating factors.    Hospital Course / Discharge diagnoses: Principal Problem Acute hypoxic respiratory failure due to COVID-19 pneumonia -Patient was started on steroids, completed Remdesivir while hospitalized.  Clinically improved, returned to baseline, able to ambulate without difficulties.  On saturation screen, he was found to require 2 L nasal cannula which will be prescribed on discharge.  He will go home in stable condition, improved, was advised to follow-up with PCP in couple of weeks.  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 Follows with pulmonology for a lung nodule.  Needs oxygen on discharge as above  Sepsis ruled out   Discharge Instructions   Allergies as of 05/09/2020      Reactions   Latex    Other reaction(s): Unknown      Medication List    TAKE these medications   amLODipine 5 MG tablet Commonly known as: NORVASC Take 5 mg by mouth daily.   atorvastatin 40 MG tablet Commonly known as: LIPITOR Take 1 tablet (40 mg total) by mouth daily at 6 PM. What changed: when to take this   clopidogrel 75 MG tablet Commonly known as: PLAVIX Take 1 tablet (75 mg total) by mouth daily with breakfast.   dexamethasone 6 MG tablet Commonly known as: DECADRON Take 1 tablet (6 mg total) by mouth daily for 6 days.   famotidine 20 MG tablet Commonly known as: PEPCID Take 1 tablet (20 mg total) by mouth 2 (two) times daily as needed (itching).   ferrous sulfate 325 (65 FE) MG tablet Take 325 mg by mouth daily.   glipiZIDE 10 MG tablet Commonly known as: GLUCOTROL Take 10 mg by mouth 2 (two) times daily.   guaiFENesin-dextromethorphan 100-10 MG/5ML syrup Commonly known as: ROBITUSSIN DM Take 10 mLs by mouth every 4 (four) hours as needed for cough.   latanoprost 0.005 % ophthalmic solution Commonly known as: XALATAN Place 1 drop into both eyes at bedtime.   lisinopril 40 MG tablet Commonly known as: ZESTRIL Take 40 mg by mouth daily.   metoprolol succinate 100 MG 24 hr tablet Commonly known as: TOPROL-XL Take 100 mg by  mouth daily.   pantoprazole 40 MG tablet Commonly known as: PROTONIX Take 40 mg by mouth 2 (two) times daily.   triamcinolone 0.1 % Commonly known as: KENALOG Apply 1 application topically 2 (two) times daily.            Durable Medical Equipment  (From admission, onward)         Start     Ordered   05/09/20 0906  For home use only DME oxygen  Once       Question Answer Comment   Length of Need 6 Months   Mode or (Route) Nasal cannula   Liters per Minute 2   Frequency Continuous (stationary and portable oxygen unit needed)   Oxygen conserving device Yes   Oxygen delivery system Gas      05/09/20 0905          Follow-up Information    Charolette Forward, MD. Schedule an appointment as soon as possible for a visit in 2 week(s).   Specialty: Cardiology Contact information: [place] [address] [phone]               Consultations:  None   Procedures/Studies:  None   DG Abd 1 View  Result Date: 05/05/2020 CLINICAL DATA:  Abdominal pain and cough. EXAM: ABDOMEN - 1 VIEW COMPARISON:  12/01/2019 and prior. FINDINGS: Nonobstructive bowel gas pattern. No significant stool burden. No evidence of pneumoperitoneum. No suspicious calcific densities. Multilevel spondylosis. IMPRESSION: Nonobstructive bowel gas pattern. Electronically Signed   By: Primitivo Gauze M.D.   On: 05/05/2020 18:16   CT Head Wo Contrast  Result Date: 05/02/2020 CLINICAL DATA:  Head trauma, minor. Additional provided: Patient reports low blood count at PCPs office, patient reports fall on Saturday, confusion and blurred vision, on blood thinners. EXAM: CT HEAD WITHOUT CONTRAST TECHNIQUE: Contiguous axial images were obtained from the base of the skull through the vertex without intravenous contrast. COMPARISON:  Brain MRI 08/23/2019. FINDINGS: Brain: Cerebral volume is normal for age. Redemonstrated chronic cortical/subcortical infarct within the right ACA territory within the right frontal and parietal lobes. Redemonstrated chronic infarct within the callosal body. Redemonstrated chronic infarcts within the bilateral cerebellar hemispheres. Background mild ill-defined hypoattenuation within the cerebral white matter is nonspecific, but compatible with chronic small vessel ischemic disease. There is no acute intracranial hemorrhage. No acute demarcated  cortical infarct. No extra-axial fluid collection. No evidence of intracranial mass. No midline shift. Vascular: No hyperdense vessel.  Atherosclerotic calcifications. Skull: Normal. Negative for fracture or focal lesion. Sinuses/Orbits: Visualized orbits show no acute finding. Mild mucosal thickening within the left frontal and bilateral ethmoid sinuses. Small left sphenoid sinus air-fluid level. Small volume frothy secretions within the right maxillary sinus. IMPRESSION: No evidence of acute intracranial abnormality. Redemonstrated chronic cortical/subcortical right ACA territory infarct. Redemonstrated chronic infarcts within the callosal body and bilateral cerebellar hemispheres. Stable background mild cerebral white matter chronic small vessel ischemic disease. Paranasal sinus disease as described. Correlate for acute sinusitis. Electronically Signed   By: Kellie Simmering DO   On: 05/02/2020 17:53   US RENAL  Result Date: 05/05/2020 CLINICAL DATA:  Initial evaluation for acute kidney injury. EXAM: RENAL / URINARY TRACT ULTRASOUND COMPLETE COMPARISON:  Prior MRI from 12/01/2019. FINDINGS: Right Kidney: Renal measurements: 10.2 x 4.3 x 6.4 cm = volume: 146.8 mL. Renal echogenicity within normal limits. No nephrolithiasis or hydronephrosis. 2.5 x 2.3 x 2.4 cm simple cyst noted at the upper pole. Additional possible 1.8 x  1.7 x 1.3 cm simple cyst noted at the upper pole as well. Left Kidney: Renal measurements: 10.6 x 5.7 x 5.2 cm = volume: 164.3 mL. Renal echogenicity within normal limits. No nephrolithiasis or hydronephrosis. Few apparent cystic lesions measured by the ultrasound technologist are felt to be consistent with normal renal parenchyma. No definite focal renal mass identified. Bladder: Appears normal for degree of bladder distention. Other: None. IMPRESSION: 1. No hydronephrosis or other acute abnormality. Renal echogenicity within normal limits. 2. Few small simple right renal cysts as above.  Electronically Signed   By: Jeannine Boga M.D.   On: 05/05/2020 19:21   DG Chest Portable 1 View  Result Date: 05/05/2020 CLINICAL DATA:  SOB, cough EXAM: PORTABLE CHEST 1 VIEW COMPARISON:  02/01/2020 and prior. FINDINGS: No pneumothorax or pleural effusion. Interstitial prominence with patchy basilar predominant opacities. Cardiomediastinal silhouette within normal limits. No acute osseous abnormality. IMPRESSION: Interstitial prominence and pulmonary opacities concerning for infection. Electronically Signed   By: Primitivo Gauze M.D.   On: 05/05/2020 14:22      Subjective: - no chest pain, shortness of breath, no abdominal pain, nausea or vomiting.   Discharge Exam: BP (!) 147/72   Pulse (!) 55   Temp (!) 97 F (36.1 C)   Resp 20   Ht 6\' 3"  (1.905 m)   Wt 81.8 kg   SpO2 99%   BMI 22.54 kg/m   General: Pt is alert, awake, not in acute distress Cardiovascular: RRR, S1/S2 +, no rubs, no gallops Respiratory: CTA bilaterally, no wheezing, no rhonchi Abdominal: Soft, NT, ND, bowel sounds + Extremities: no edema, no cyanosis    The results of significant diagnostics from this hospitalization (including imaging, microbiology, ancillary and laboratory) are listed below for reference.     Microbiology: Recent Results (from the past 240 hour(s))  Blood Culture (routine x 2)     Status: None (Preliminary result)   Collection Time: 05/05/20  3:57 PM   Specimen: Right Antecubital; Blood  Result Value Ref Range Status   Specimen Description   Final    RIGHT ANTECUBITAL Performed at [facility] [address], [place], [place] [address]    Special Requests   Final    BOTTLES DRAWN AEROBIC AND ANAEROBIC Blood Culture adequate volume Performed at [facility] [address], [place], [place] [address]    Culture   Final    NO GROWTH 2 DAYS Performed at [facility] [address], [place], [place] [address]    Report  Status PENDING  Incomplete  Blood Culture (routine x 2)     Status: None (Preliminary result)   Collection Time: 05/06/20  5:11 AM   Specimen: BLOOD  Result Value Ref Range Status   Specimen Description   Final    BLOOD RIGHT ANTECUBITAL Performed at [facility] [address], [place], [place] [address]    Special Requests   Final    BOTTLES DRAWN AEROBIC ONLY Blood Culture adequate volume Performed at [facility] [address] [place], [place] [address]    Culture   Final    NO GROWTH 2 DAYS Performed at [facility] [address], [place], [place] [address]    Report Status PENDING  Incomplete     Labs: Basic Metabolic Panel: Recent Labs  Lab 05/05/20 1016 05/05/20 2151 05/06/20 0511 05/07/20 0338 05/08/20 0346 05/09/20 0422  NA 139  --  140 141 143  142  K 3.7  --  4.1 4.3 4.0 3.5  CL 106  --  111 112* 110 108  CO2 21*  --  22 22 23 23   GLUCOSE 74  --  229* 245* 182* 178*  BUN 29*  --  23 24* 23 21  CREATININE 1.80* 1.36* 1.26* 1.18 1.06 1.10  CALCIUM 8.8*  --  8.4* 8.5* 8.7* 8.7*  MG  --   --  2.1 2.2 2.3 2.1   Liver Function Tests: Recent Labs  Lab 05/05/20 1016 05/06/20 0511 05/07/20 0338 05/08/20 0346 05/09/20 0422  AST 57* 50* 37 38 43*  ALT 33 36 29 29 34  ALKPHOS 56 45 50 48 56  BILITOT 0.7 0.6 0.5 0.4 0.6  PROT 7.3 6.0* 5.7* 5.7* 6.4*  ALBUMIN 3.9 2.9* 2.8* 3.0* 3.2*   CBC: Recent Labs  Lab 05/05/20 2151 05/06/20 0511 05/07/20 0338 05/08/20 0346 05/09/20 0422  WBC 2.2* 1.7* 3.8* 4.1 4.2  NEUTROABS  --  1.1* 2.8 2.9 3.0  HGB 8.7* 9.5* 9.4* 9.9* 10.4*  HCT 26.6* 30.0* 28.2* 30.3* 31.9*  MCV 89.9 90.1 88.1 88.3 88.4  PLT 152 179 216 266 299   CBG: Recent Labs  Lab 05/08/20 0848 05/08/20 1152 05/08/20 1710 05/08/20 2141 05/09/20 0843  GLUCAP 124* 148* 240* 194* 154*   Hgb A1c No results for input(s): HGBA1C in the last 72 hours. Lipid Profile No results for input(s): CHOL,  HDL, LDLCALC, TRIG, CHOLHDL, LDLDIRECT in the last 72 hours. Thyroid function studies No results for input(s): TSH, T4TOTAL, T3FREE, THYROIDAB in the last 72 hours.  Invalid input(s): FREET3 Urinalysis    Component Value Date/Time   COLORURINE YELLOW 05/06/2020 0828   APPEARANCEUR CLEAR 05/06/2020 0828   LABSPEC 1.017 05/06/2020 0828   PHURINE 5.0 05/06/2020 0828   GLUCOSEU >=500 (A) 05/06/2020 0828   HGBUR NEGATIVE 05/06/2020 0828   BILIRUBINUR NEGATIVE 05/06/2020 0828   KETONESUR NEGATIVE 05/06/2020 0828   PROTEINUR NEGATIVE 05/06/2020 0828   NITRITE NEGATIVE 05/06/2020 0828   LEUKOCYTESUR NEGATIVE 05/06/2020 0828    FURTHER DISCHARGE INSTRUCTIONS:   Get Medicines reviewed and adjusted: Please take all your medications with you for your next visit with your Primary MD   Laboratory/radiological data: Please request your Primary MD to go over all hospital tests and procedure/radiological results at the follow up, please ask your Primary MD to get all Hospital records sent to his/her office.   In some cases, they will be blood work, cultures and biopsy results pending at the time of your discharge. Please request that your primary care M.D. goes through all the records of your hospital data and follows up on these results.   Also Note the following: If you experience worsening of your admission symptoms, develop shortness of breath, life threatening emergency, suicidal or homicidal thoughts you must seek medical attention immediately by calling 911 or calling your MD immediately  if symptoms less severe.   You must read complete instructions/literature along with all the possible adverse reactions/side effects for all the Medicines you take and that have been prescribed to you. Take any new Medicines after you have completely understood and accpet all the possible adverse reactions/side effects.    Do not drive when taking Pain medications or sleeping medications  (Benzodaizepines)   Do not take more than prescribed Pain, Sleep and Anxiety Medications. It is not advisable to combine anxiety,sleep and pain medications without talking with your primary care practitioner   Special Instructions: If you  have smoked or chewed Tobacco  in the last 2 yrs please stop smoking, stop any regular Alcohol  and or any Recreational drug use.   Wear Seat belts while driving.   Please note: You were cared for by a hospitalist during your hospital stay. Once you are discharged, your primary care physician will handle any further medical issues. Please note that NO REFILLS for any discharge medications will be authorized once you are discharged, as it is imperative that you return to your primary care physician (or establish a relationship with a primary care physician if you do not have one) for your post hospital discharge needs so that they can reassess your need for medications and monitor your lab values.  Time coordinating discharge: 35 minutes  SIGNED:  Marzetta Board, MD, PhD 05/09/2020, 9:10 AM

## 2020-05-09 NOTE — TOC Transition Note (Signed)
Transition of Care [facility]) - CM/SW Discharge Note   Patient Details  Name: Elijah Sandoval MRN: [id number] Date of Birth: 09/25/1950  Transition of Care [facility]) CM/SW Contact:  Trish Mage, LCSW Phone Number: 05/09/2020, 9:17 AM   Clinical Narrative:   Patient who is discharging today is in need of home O2, has a ride.  SAT note and order seen and appreciated.  Contacted Zach with [facility] who will arrange for delivery of home unit, travel canister.  No further needs identifed.  TOC sign off.    Final next level of care: Home/Self Care Barriers to Discharge: No Barriers Identified   Patient Goals and CMS Choice        Discharge Placement                       Discharge Plan and Services                                     Social Determinants of Health (SDOH) Interventions     Readmission Risk Interventions No flowsheet data found.

## 2020-05-09 NOTE — Discharge Instructions (Signed)
Follow with Charolette Forward, MD in 5-7 days  Please get a complete blood count and chemistry panel checked by your Primary MD at your next visit, and again as instructed by your Primary MD. Please get your medications reviewed and adjusted by your Primary MD.  Please request your Primary MD to go over all Hospital Tests and Procedure/Radiological results at the follow up, please get all Hospital records sent to your Prim MD by signing hospital release before you go home.  In some cases, there will be blood work, cultures and biopsy results pending at the time of your discharge. Please request that your primary care M.D. goes through all the records of your hospital data and follows up on these results.  If you had Pneumonia of Lung problems at the Hospital: Please get a 2 view Chest X ray done in 6-8 weeks after hospital discharge or sooner if instructed by your Primary MD.  If you have Congestive Heart Failure: Please call your Cardiologist or Primary MD anytime you have any of the following symptoms:  1) 3 pound weight gain in 24 hours or 5 pounds in 1 week  2) shortness of breath, with or without a dry hacking cough  3) swelling in the hands, feet or stomach  4) if you have to sleep on extra pillows at night in order to breathe  Follow cardiac low salt diet and 1.5 lit/day fluid restriction.  If you have diabetes Accuchecks 4 times/day, Once in AM empty stomach and then before each meal. Log in all results and show them to your primary doctor at your next visit. If any glucose reading is under 80 or above 300 call your primary MD immediately.  If you have Seizure/Convulsions/Epilepsy: Please do not drive, operate heavy machinery, participate in activities at heights or participate in high speed sports until you have seen by Primary MD or a Neurologist and advised to do so again. Per [facility] statutes, patients with seizures are not allowed to drive until they have been  seizure-free for six months.  Use caution when using heavy equipment or power tools. Avoid working on ladders or at heights. Take showers instead of baths. Ensure the water temperature is not too high on the home water heater. Do not go swimming alone. Do not lock yourself in a room alone (i.e. bathroom). When caring for infants or small children, sit down when holding, feeding, or changing them to minimize risk of injury to the child in the event you have a seizure. Maintain good sleep hygiene. Avoid alcohol.   If you had Gastrointestinal Bleeding: Please ask your Primary MD to check a complete blood count within one week of discharge or at your next visit. Your endoscopic/colonoscopic biopsies that are pending at the time of discharge, will also need to followed by your Primary MD.  Get Medicines reviewed and adjusted. Please take all your medications with you for your next visit with your Primary MD  Please request your Primary MD to go over all hospital tests and procedure/radiological results at the follow up, please ask your Primary MD to get all Hospital records sent to his/her office.  If you experience worsening of your admission symptoms, develop shortness of breath, life threatening emergency, suicidal or homicidal thoughts you must seek medical attention immediately by calling 911 or calling your MD immediately  if symptoms less severe.  You must read complete instructions/literature along with all the possible adverse reactions/side effects for all the Medicines you take  and that have been prescribed to you. Take any new Medicines after you have completely understood and accpet all the possible adverse reactions/side effects.   Do not drive or operate heavy machinery when taking Pain medications.   Do not take more than prescribed Pain, Sleep and Anxiety Medications  Special Instructions: If you have smoked or chewed Tobacco  in the last 2 yrs please stop smoking, stop any regular  Alcohol  and or any Recreational drug use.  Wear Seat belts while driving.  Please note You were cared for by a hospitalist during your hospital stay. If you have any questions about your discharge medications or the care you received while you were in the hospital after you are discharged, you can call the unit and asked to speak with the hospitalist on call if the hospitalist that took care of you is not available. Once you are discharged, your primary care physician will handle any further medical issues. Please note that NO REFILLS for any discharge medications will be authorized once you are discharged, as it is imperative that you return to your primary care physician (or establish a relationship with a primary care physician if you do not have one) for your aftercare needs so that they can reassess your need for medications and monitor your lab values.  You can reach the hospitalist office at phone [phone] or fax [phone]   If you do not have a primary care physician, you can call [phone] for a physician referral.  Activity: As tolerated with Full fall precautions use [place]/cane & assistance as needed    Diet: regular  Disposition Home

## 2020-05-09 NOTE — Plan of Care (Signed)
Patient  and wife was educated on discharge plan

## 2020-05-11 LAB — CULTURE, BLOOD (ROUTINE X 2)
Culture: NO GROWTH
Culture: NO GROWTH
Special Requests: ADEQUATE
Special Requests: ADEQUATE

## 2020-06-06 ENCOUNTER — Other Ambulatory Visit: Payer: Self-pay

## 2020-06-06 ENCOUNTER — Encounter: Payer: Self-pay | Admitting: Pulmonary Disease

## 2020-06-06 ENCOUNTER — Ambulatory Visit (INDEPENDENT_AMBULATORY_CARE_PROVIDER_SITE_OTHER): Payer: Medicare Other | Admitting: Pulmonary Disease

## 2020-06-06 VITALS — BP 130/60 | HR 75 | Temp 97.9°F | Ht 75.0 in | Wt 171.4 lb

## 2020-06-06 DIAGNOSIS — R911 Solitary pulmonary nodule: Secondary | ICD-10-CM

## 2020-06-06 NOTE — Patient Instructions (Signed)
CT chest without contrast to follow-up on right lung nodule-to be done now  Breathing study to be done on day of next visit  Follow-up in 3 months  Continue to check your oxygen on a regular basis, goal is to keep it above 90 at all times  Once you are able to ascertain that it has been maintained above 90, you can call us to have the oxygen discontinued

## 2020-06-06 NOTE — Progress Notes (Signed)
Elijah Sandoval    [id number]    [birth year]  Primary Care Physician:Harwani, Prudencio Burly, MD  Referring Physician: Charolette Forward, MD [phone] [address] 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 amLODipine (NORVASC) 5 MG tablet Take 5 mg by mouth daily.  Marland Kitchen atorvastatin (LIPITOR) 40 MG tablet Take 1 tablet (40 mg total) by mouth daily at 6 PM. (Patient taking differently: Take 40 mg by mouth daily.)  . clopidogrel (PLAVIX) 75 MG tablet Take 1 tablet (75 mg total) by mouth daily with breakfast.  . famotidine (PEPCID) 20 MG tablet Take 1 tablet (20 mg total) by mouth 2 (two) times daily as needed (itching).  . ferrous sulfate 325 (65 FE) MG tablet Take 325 mg by mouth daily.  Marland Kitchen glipiZIDE (GLUCOTROL) 10 MG tablet Take 10 mg by mouth 2 (two) times daily.  Marland Kitchen guaiFENesin-dextromethorphan (ROBITUSSIN DM) 100-10 MG/5ML syrup Take 10 mLs by mouth every 4 (four) hours as needed for cough.  . latanoprost (XALATAN) 0.005 % ophthalmic solution Place 1 drop into both eyes at bedtime.  Marland Kitchen lisinopril (ZESTRIL) 40 MG tablet Take 40 mg by mouth daily.  . metoprolol succinate (TOPROL-XL) 100 MG 24 hr tablet Take 100 mg  by mouth daily.  . pantoprazole (PROTONIX) 40 MG tablet Take 40 mg by mouth 2 (two) times daily.  Marland Kitchen triamcinolone cream (KENALOG) 0.1 % Apply 1 application topically 2 (two) times daily.   No facility-administered encounter medications on file as of 06/06/2020.    Allergies as of 06/06/2020 - Review Complete 06/06/2020  Allergen Reaction Noted  . Latex  01/21/2020    Past Medical History:  Diagnosis Date  . Diabetes mellitus   . Stroke [facility])    Pt reports TIA in 2013  . Transfusion history     Past Surgical History:  Procedure Laterality Date  . NO PAST SURGERIES      Family History  Problem Relation Age of Onset  . Cancer Sister   . Cancer Sister     Social History   Socioeconomic History  . Marital status: Married    Spouse name: [name]  . Number of children: 3  . Years of education: HS  . Highest education level: Not on file  Occupational History  . Occupation: self employed    Employer: [employer]  Tobacco Use  . Smoking status: Former Smoker    Packs/day: 0.25    Years: 15.00    Pack years: 3.75    Types: Cigarettes  . Smokeless tobacco: Never Used  Vaping Use  . Vaping Use: Never used  Substance and Sexual Activity  . Alcohol use: No  .  Drug use: No  . Sexual activity: Not on file  Other Topics Concern  . Not on file  Social History Narrative   Patient lives at home with spouse.   Caffeine Use: Coffee on weekends   Social Determinants of Health   Financial Resource Strain: Not on file  Food Insecurity: Not on file  Transportation Needs: Not on file  Physical Activity: Not on file  Stress: Not on file  Social Connections: Not on file  Intimate Partner Violence: Not on file    Review of Systems  Constitutional: Negative for fatigue.  Respiratory: Negative for cough and shortness of breath.     Vitals:   06/06/20 1006  BP: 130/60  Pulse: 75  Temp: 97.9 F (36.6 C)  SpO2: 94%     Physical Exam Constitutional:       Appearance: Normal appearance.  HENT:     Nose: No congestion.     Mouth/Throat:     Pharynx: No oropharyngeal exudate.  Eyes:     General:        Right eye: No discharge.        Left eye: No discharge.  Cardiovascular:     Rate and Rhythm: Normal rate and regular rhythm.     Pulses: Normal pulses.     Heart sounds: Normal heart sounds. No murmur heard. No friction rub.  Pulmonary:     Effort: Pulmonary effort is normal. No respiratory distress.     Breath sounds: Normal breath sounds. No stridor. No wheezing or rhonchi.  Neurological:     Mental Status: He is alert.  Psychiatric:        Mood and Affect: Mood normal.    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

## 2020-06-13 ENCOUNTER — Ambulatory Visit
Admission: RE | Admit: 2020-06-13 | Discharge: 2020-06-13 | Disposition: A | Discharge status: Home / Self Care | Payer: Medicare Other | Source: Ambulatory Visit | Attending: Pulmonary Disease | Admitting: Pulmonary Disease

## 2020-06-13 DIAGNOSIS — R911 Solitary pulmonary nodule: Secondary | ICD-10-CM

## 2020-07-06 ENCOUNTER — Other Ambulatory Visit: Payer: Self-pay | Admitting: Gastroenterology

## 2020-07-06 DIAGNOSIS — C259 Malignant neoplasm of pancreas, unspecified: Secondary | ICD-10-CM

## 2020-07-28 ENCOUNTER — Ambulatory Visit
Admission: RE | Admit: 2020-07-28 | Discharge: 2020-07-28 | Disposition: A | Discharge status: Home / Self Care | Payer: Medicare Other | Source: Ambulatory Visit | Attending: Gastroenterology | Admitting: Gastroenterology

## 2020-07-28 ENCOUNTER — Other Ambulatory Visit: Payer: Self-pay

## 2020-07-28 DIAGNOSIS — C259 Malignant neoplasm of pancreas, unspecified: Secondary | ICD-10-CM

## 2020-07-28 MED ORDER — GADOBENATE DIMEGLUMINE 529 MG/ML IV SOLN
15.0000 mL | Freq: Once | INTRAVENOUS | Status: AC | PRN
  Administered 2020-07-28: 15 mL via INTRAVENOUS

## 2020-11-15 ENCOUNTER — Ambulatory Visit (INDEPENDENT_AMBULATORY_CARE_PROVIDER_SITE_OTHER): Payer: Medicare Other | Admitting: Pulmonary Disease

## 2020-11-15 ENCOUNTER — Other Ambulatory Visit: Payer: Self-pay

## 2020-11-15 ENCOUNTER — Encounter: Payer: Self-pay | Admitting: Pulmonary Disease

## 2020-11-15 VITALS — BP 130/74 | HR 95 | Temp 98.9°F | Ht 75.0 in | Wt 177.4 lb

## 2020-11-15 DIAGNOSIS — J849 Interstitial pulmonary disease, unspecified: Secondary | ICD-10-CM | POA: Diagnosis not present

## 2020-11-15 DIAGNOSIS — R9389 Abnormal findings on diagnostic imaging of other specified body structures: Secondary | ICD-10-CM

## 2020-11-15 DIAGNOSIS — R911 Solitary pulmonary nodule: Secondary | ICD-10-CM

## 2020-11-15 LAB — PULMONARY FUNCTION TEST
DL/VA % pred: 58 %
DL/VA: 2.32 ml/min/mmHg/L
DLCO cor % pred: 36 %
DLCO cor: 10.98 ml/min/mmHg
DLCO unc % pred: 36 %
DLCO unc: 10.98 ml/min/mmHg
FEF 25-75 Pre: 1.58 L/sec
FEF2575-%Pred-Pre: 52 %
FEV1-%Pred-Pre: 67 %
FEV1-Pre: 2.4 L
FEV1FVC-%Pred-Pre: 91 %
FEV6-%Pred-Pre: 76 %
FEV6-Pre: 3.42 L
FEV6FVC-%Pred-Pre: 104 %
FVC-%Pred-Pre: 73 %
FVC-Pre: 3.42 L
Pre FEV1/FVC ratio: 70 %
Pre FEV6/FVC Ratio: 100 %
RV % pred: 83 %
RV: 2.25 L
TLC % pred: 66 %
TLC: 5.36 L

## 2020-11-15 NOTE — Progress Notes (Signed)
Spirometry/DLCO/PLETH PFT performed today.

## 2020-11-15 NOTE — Patient Instructions (Addendum)
PFT  spirometry/dlco/ pleth performed today.

## 2020-11-15 NOTE — Addendum Note (Signed)
Addended by: Gavin Potters R on: 11/15/2020 04:40 PM   Modules accepted: Orders

## 2020-11-15 NOTE — Patient Instructions (Signed)
Breathing study did show some obstruction  Your CT scan from before did show some scarring relating to when you had COVID  Repeat CT scan of the chest to evaluate for progression CT may be done in 4 to 6 weeks   I will see you back in the office in 6 months  Call with significant concerns

## 2020-11-15 NOTE — Progress Notes (Signed)
Elijah Sandoval    [id number]    December 15, 1950  Primary Care Physician:Harwani, Prudencio Burly, MD  Referring Physician: Charolette Forward, MD 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   atorvastatin (LIPITOR) 40 MG tablet Take 1 tablet (40 mg total) by mouth daily at 6 PM. (Patient taking differently: Take 40 mg by mouth daily.)   clopidogrel (PLAVIX) 75 MG tablet Take 1 tablet (75 mg total) by mouth daily with breakfast.   famotidine (PEPCID) 20 MG tablet Take 1 tablet (20 mg total) by mouth 2 (two) times daily as needed (itching).   ferrous sulfate 325 (65 FE) MG tablet Take 325 mg by mouth daily.   glipiZIDE (GLUCOTROL) 10 MG tablet Take 10 mg by mouth 2 (two) times daily.   guaiFENesin-dextromethorphan (ROBITUSSIN DM) 100-10 MG/5ML syrup Take 10 mLs by mouth every 4 (four) hours as needed for cough.   latanoprost (XALATAN) 0.005 % ophthalmic solution Place 1 drop into both eyes at bedtime.   lisinopril (ZESTRIL) 40 MG tablet Take 40 mg by mouth daily.   metoprolol succinate (TOPROL-XL) 100 MG 24 hr  tablet Take 100 mg by mouth daily.   pantoprazole (PROTONIX) 40 MG tablet Take 40 mg by mouth 2 (two) times daily.   triamcinolone cream (KENALOG) 0.1 % Apply 1 application topically 2 (two) times daily.   No facility-administered encounter medications on file as of 11/15/2020.    Allergies as of 11/15/2020 - Review Complete 11/15/2020  Allergen Reaction Noted   Latex  01/21/2020    Past Medical History:  Diagnosis Date   Diabetes mellitus    Stroke [facility])    Pt reports TIA in 2013   Transfusion history     Past Surgical History:  Procedure Laterality Date   NO PAST SURGERIES      Family History  Problem Relation Age of Onset   Cancer Sister    Cancer Sister     Social History   Socioeconomic History   Marital status: Married    Spouse name: [name]   Number of children: 3   Years of education: HS   Highest education level: Not on file  Occupational History   Occupation: self employed    Employer: [employer]  Tobacco Use   Smoking status: Former    Packs/day: 0.25    Years: 15.00    Pack years: 3.75    Types: Cigarettes   Smokeless tobacco: Never  Vaping Use   Vaping Use: Never used  Substance and Sexual Activity  Alcohol use: No   Drug use: No   Sexual activity: Not on file  Other Topics Concern   Not on file  Social History Narrative   Patient lives at home with spouse.   Caffeine Use: Coffee on weekends   Social Determinants of Health   Financial Resource Strain: Not on file  Food Insecurity: Not on file  Transportation Needs: Not on file  Physical Activity: Not on file  Stress: Not on file  Social Connections: Not on file  Intimate Partner Violence: Not on file    Review of Systems  Constitutional:  Negative for fatigue.  Respiratory:  Negative for cough and shortness of breath.    Vitals:   11/15/20 1604  BP: 130/74  Pulse: 95  Temp: 98.9 F (37.2 C)  SpO2: 100%     Physical Exam Constitutional:      Appearance:  Normal appearance.  Eyes:     General:        Right eye: No discharge.        Left eye: No discharge.  Cardiovascular:     Rate and Rhythm: Normal rate and regular rhythm.     Pulses: Normal pulses.     Heart sounds: Normal heart sounds. No murmur heard.   No friction rub.  Pulmonary:     Effort: Pulmonary effort is normal. No respiratory distress.     Breath sounds: Normal breath sounds. No stridor. No wheezing or rhonchi.  Neurological:     Mental Status: He is alert.  Psychiatric:        Mood and Affect: Mood normal.   Data Reviewed: Most recent CT significant for emphysema, adenopathy, fibrotic changes The left upper lobe nodule is new-no previous CT to compare with, it is also perifissural  Previous myocardial scan revealing ejection fraction of 45%  Assessment:  Abnormal CT showing evidence of fibrosis and emphysema  Post COVID changes  Emphysema -Extensive changes reviewed with the patient  Reformed smoker  Denies any shortness of breath or limitations   Plan/Recommendations: Follow-up CT in 4 to 6 weeks  Encouraged to call with any significant concerns  Graded exercise as tolerated  Follow-up in 6 months  Sherrilyn Rist MD [facility] 11/15/2020, 4:08 PM  CC: Charolette Forward, MD

## 2020-12-13 ENCOUNTER — Telehealth: Payer: Self-pay | Admitting: Pulmonary Disease

## 2020-12-13 DIAGNOSIS — U071 COVID-19: Secondary | ICD-10-CM

## 2020-12-13 NOTE — Telephone Encounter (Signed)
I called and spoke with patient who is requesting for oxygen to be picked up. Patient has not been using it as O2 stats have remained in the 90s. Will route to Dr. Ander Slade to see if he is ok with D/c O2.  Dr. Ander Slade, please advise on O2. Thanks!

## 2020-12-15 ENCOUNTER — Ambulatory Visit
Admission: RE | Admit: 2020-12-15 | Discharge: 2020-12-15 | Disposition: A | Discharge status: Home / Self Care | Payer: Medicare Other | Source: Ambulatory Visit | Attending: Pulmonary Disease | Admitting: Pulmonary Disease

## 2020-12-15 ENCOUNTER — Other Ambulatory Visit: Payer: Self-pay

## 2020-12-15 DIAGNOSIS — J849 Interstitial pulmonary disease, unspecified: Secondary | ICD-10-CM

## 2020-12-15 NOTE — Telephone Encounter (Signed)
Okay to discontinue oxygen

## 2020-12-15 NOTE — Telephone Encounter (Signed)
I have sent in the order to D/C oxygen to [employer]. Nothing further needed.

## 2021-01-26 ENCOUNTER — Other Ambulatory Visit: Payer: Self-pay | Admitting: Gastroenterology

## 2021-01-26 DIAGNOSIS — K869 Disease of pancreas, unspecified: Secondary | ICD-10-CM

## 2021-02-20 ENCOUNTER — Other Ambulatory Visit: Payer: Self-pay

## 2021-02-20 ENCOUNTER — Ambulatory Visit
Admission: RE | Admit: 2021-02-20 | Discharge: 2021-02-20 | Disposition: A | Discharge status: Home / Self Care | Payer: Medicare Other | Source: Ambulatory Visit | Attending: Gastroenterology | Admitting: Gastroenterology

## 2021-02-20 DIAGNOSIS — K869 Disease of pancreas, unspecified: Secondary | ICD-10-CM

## 2021-02-20 MED ORDER — GADOBENATE DIMEGLUMINE 529 MG/ML IV SOLN
17.0000 mL | Freq: Once | INTRAVENOUS | Status: AC | PRN
  Administered 2021-02-20: 17 mL via INTRAVENOUS

## 2021-03-28 IMAGING — CT CT ABD-PELV W/ CM
2 of 5 series · 16 of 46 positions shown, 18 images · IV contrast (Omni 300)
Comparison: None.

CLINICAL DATA: Symptomatic anemia. Unintentional weight loss.
Generalized abdominal pain. Clinical concern for colon cancer.

EXAM:
CT ABDOMEN AND PELVIS WITH CONTRAST
TECHNIQUE: Multidetector CT imaging of the abdomen and pelvis was performed
using the standard protocol following bolus administration of
intravenous contrast.
CONTRAST:  100mL OMNIPAQUE IOHEXOL 300 MG/ML  SOLN

[Series 3: a/p w/ 5mm · axial · 0.85mm/px · z∈[+945,+1375]mm · 13 of 98 slices shown, 15 images]
[im 6/98  soft-tissue]
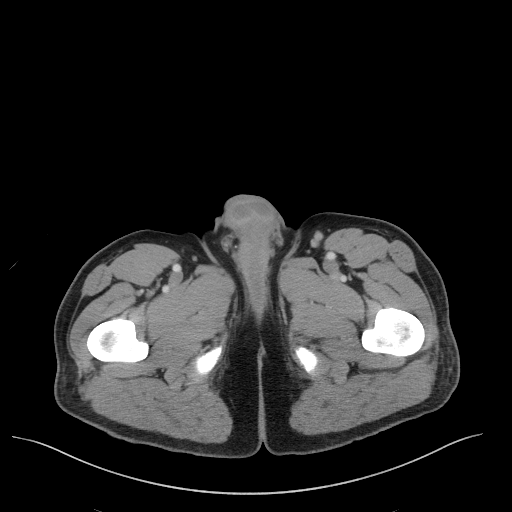
[im 6/98  bone]
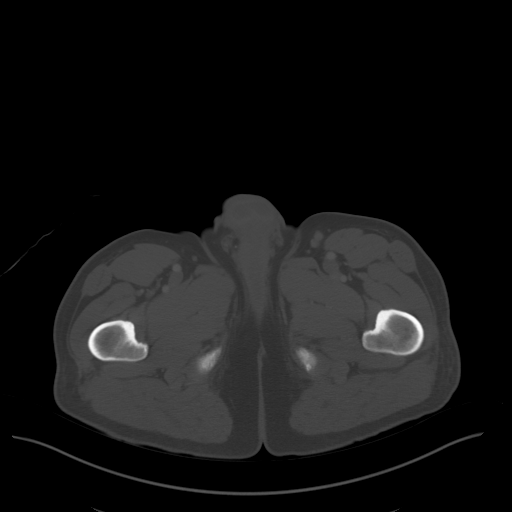
[im 11/98  soft-tissue]
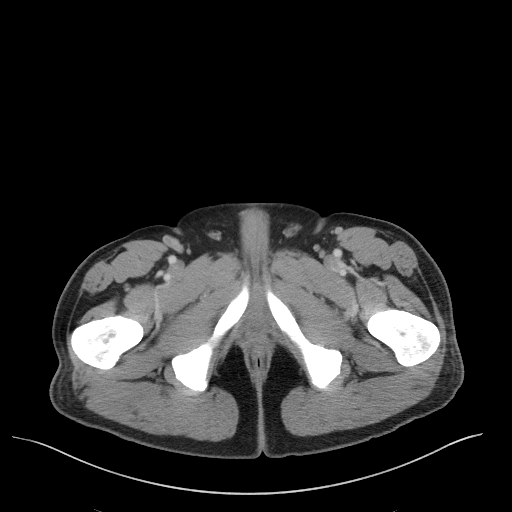
[im 22/98  soft-tissue]
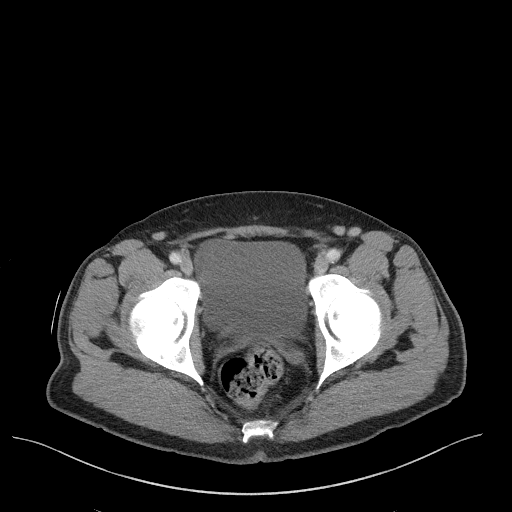
[im 27/98  soft-tissue]
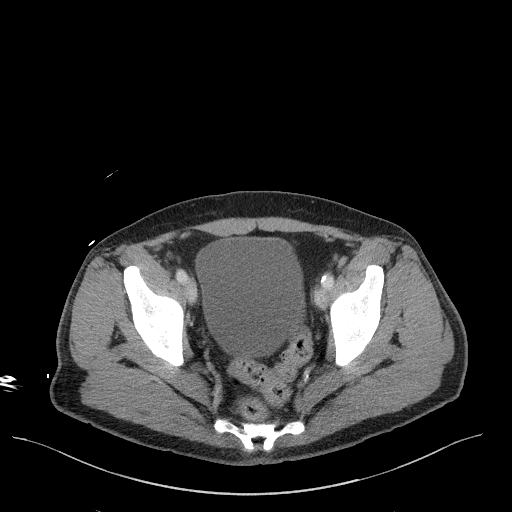
[im 33/98  soft-tissue]
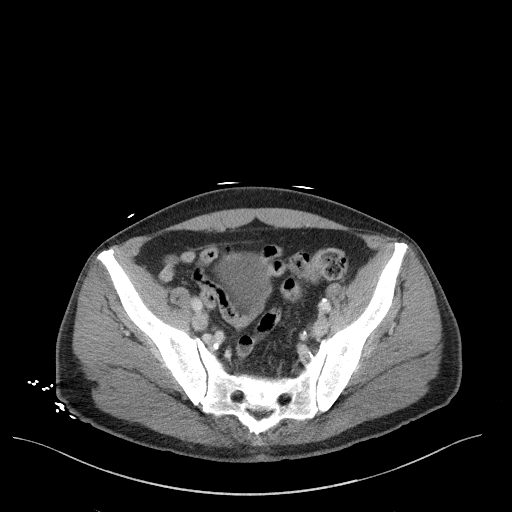
[im 44/98  soft-tissue]
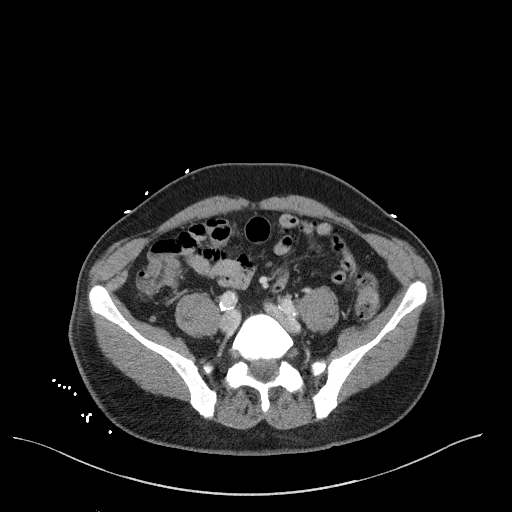
[im 49/98  soft-tissue]
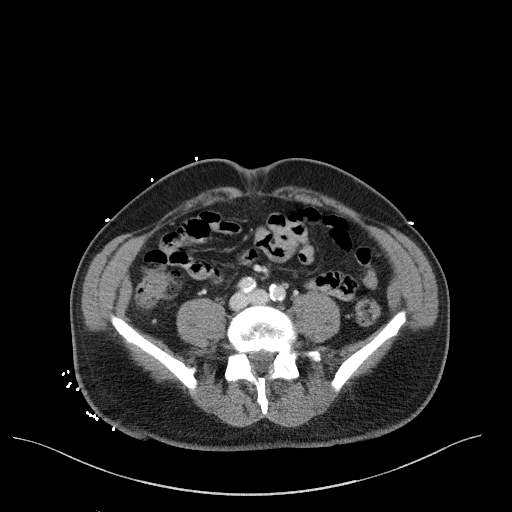
[im 54/98  soft-tissue]
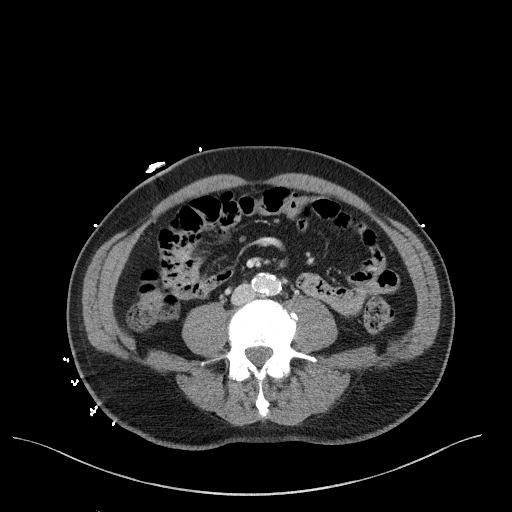
[im 65/98  soft-tissue]
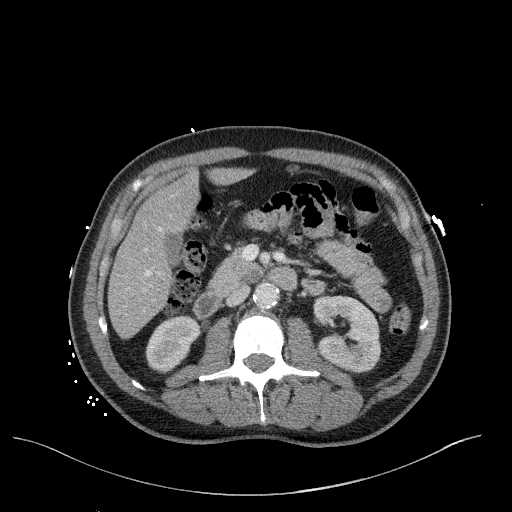
[im 65/98  bone]
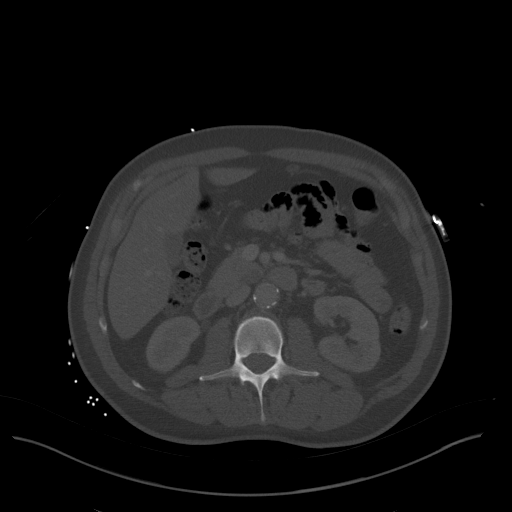
[im 71/98  soft-tissue]
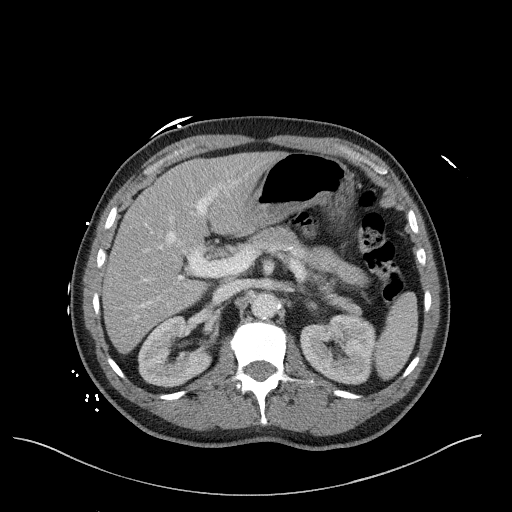
[im 76/98  soft-tissue]
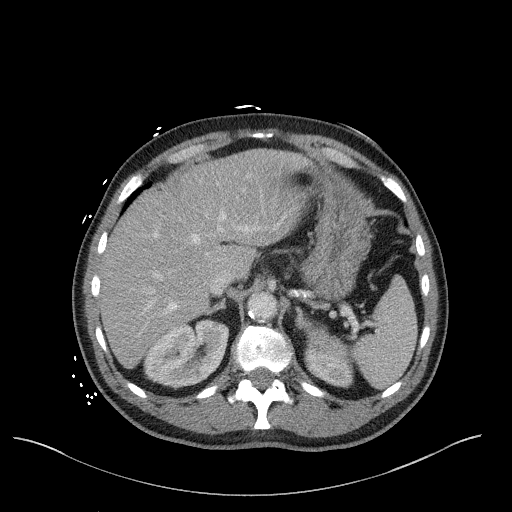
[im 87/98  soft-tissue]
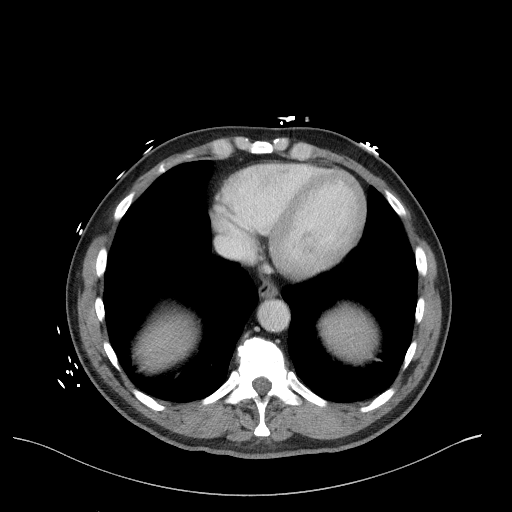
[im 92/98  soft-tissue]
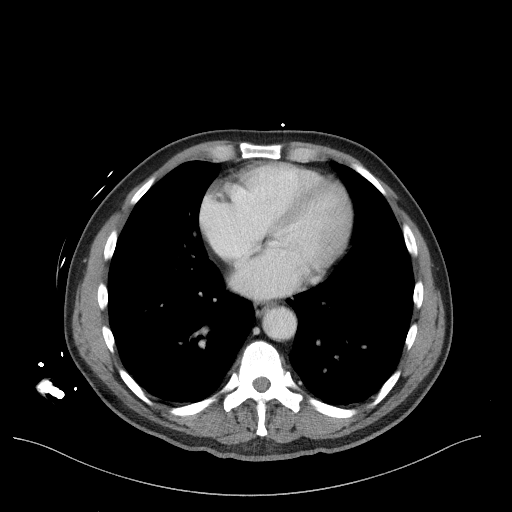

[Series 6: a/p w/ cor · coronal · 0.89mm/px · 3 of 171 slices shown]
[im 57/171  soft-tissue]
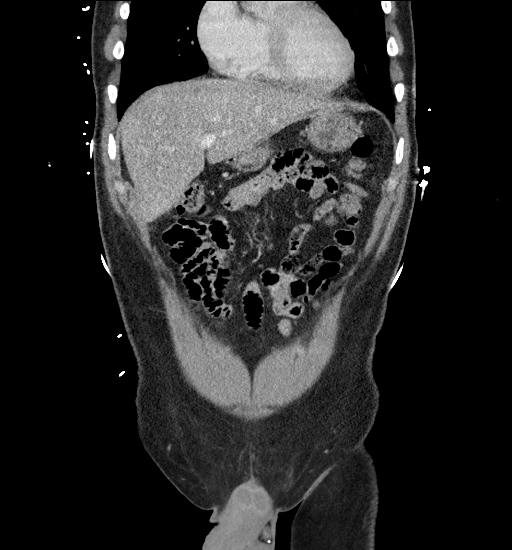
[im 76/171  soft-tissue]
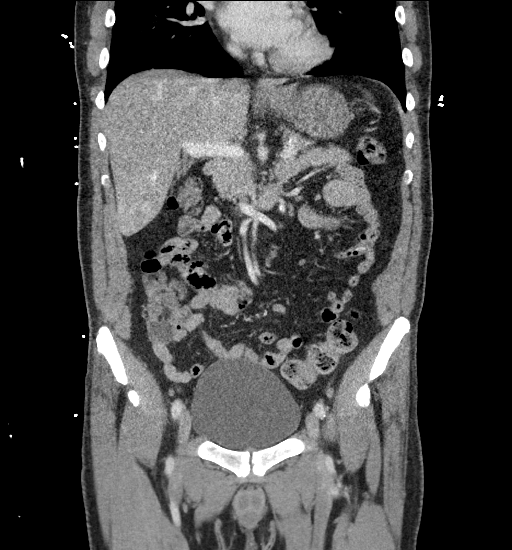
[im 95/171  soft-tissue]
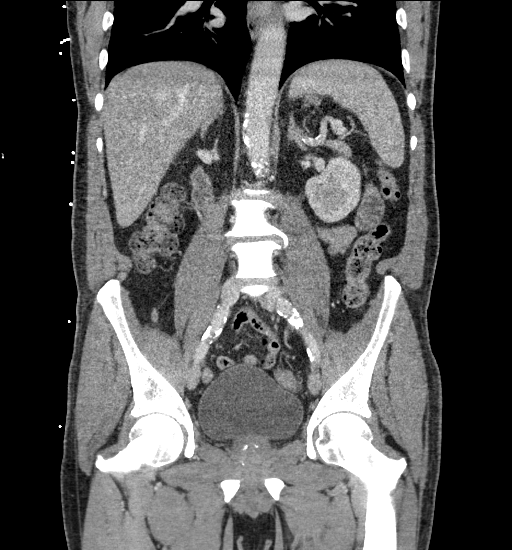

[16 of 46 positions shown; findings below may reference images not displayed]

FINDINGS: Lower chest: Right middle lobe 3 mm solid pulmonary nodule along the
minor fissure (series 4/image 3). Coronary atherosclerosis.

Hepatobiliary: Normal liver size. No liver mass. Normal gallbladder
with no radiopaque cholelithiasis. No biliary ductal dilatation.

Pancreas: Suggestion of a subtle low-attenuation 1.3 x 1.1 cm
uncinate process focus (series 3/image 34). No additional potential
pancreatic lesions. No pancreatic duct dilation.

Spleen: Normal size. No mass.

Adrenals/Urinary Tract: Normal adrenals. No hydronephrosis.
Subcentimeter hypodense renal cortical lesion in the posterior upper
right kidney, too small to characterize, requiring no follow-up.
Normal bladder.

Stomach/Bowel: Normal non-distended stomach. Normal caliber small
bowel with no small bowel wall thickening. Normal appendix. Normal
large bowel with no diverticulosis, large bowel wall thickening or
pericolonic fat stranding. No discrete large bowel mass.

Vascular/Lymphatic: Atherosclerotic nonaneurysmal abdominal aorta.
Patent portal, splenic, hepatic and renal veins. No pathologically
enlarged lymph nodes in the abdomen or pelvis.

Reproductive: Normal size prostate with coarse nonspecific internal
prostatic calcifications.

Other: No pneumoperitoneum, ascites or focal fluid collection.

Musculoskeletal: No aggressive appearing focal osseous lesions.
Moderate thoracolumbar spondylosis.
IMPRESSION: 1. No acute bowel pathology. No discrete mass or wall thickening in
the colon. Please note that colon cancer cannot be reliably excluded
on the basis of a routine CT study. GI consultation for colonoscopy
evaluation strongly advised.
2. Suggestion of a subtle low-attenuation 1.3 cm lesion in the
uncinate process. No biliary or pancreatic duct dilation. MRI
abdomen without and with IV contrast recommended for further
characterization.
3. Tiny 3 mm right middle lobe pulmonary nodule. No follow-up needed
if patient is low-risk. Non-contrast chest CT can be considered in
12 months if patient is high-risk. This recommendation follows the
consensus statement: Guidelines for Management of Incidental
Pulmonary Nodules Detected on CT Images:From the [HOSPITAL]
5732; published online before print (10.1148/radiol.6636303019).
4.  Aortic Atherosclerosis ([id number]).

## 2021-05-14 ENCOUNTER — Telehealth: Payer: Self-pay | Admitting: Pulmonary Disease

## 2021-05-14 NOTE — Telephone Encounter (Signed)
Called and spoke with pt who states that he no longer needs his O2 and wants all equipment to picked up by DME. Dr. Jenetta Downer, please advise.

## 2021-05-16 NOTE — Telephone Encounter (Signed)
Oxygen supplementation can be discontinued

## 2021-05-16 NOTE — Telephone Encounter (Signed)
Spoke with the pt and notified of response per Dr Ander Slade  We already sent order to d/c o2 in Sept 2022  Pt says no one ever called him about picking up  PCC's- can you please help with this and see why our request was not fulfilled? Do we need to put in a new referral? Thanks!

## 2021-05-16 NOTE — Telephone Encounter (Addendum)
I spoke to Cameron at [place].  She states she put in ticket on 9/6 for O2 to be picked up.  She is going to reach out and find out why not picked up and she is going to call the pt.  Nothing further needed.

## 2021-06-07 ENCOUNTER — Other Ambulatory Visit: Payer: Self-pay | Admitting: Student

## 2021-06-07 DIAGNOSIS — R600 Localized edema: Secondary | ICD-10-CM

## 2021-06-07 DIAGNOSIS — D689 Coagulation defect, unspecified: Secondary | ICD-10-CM

## 2021-06-07 DIAGNOSIS — M79605 Pain in left leg: Secondary | ICD-10-CM

## 2021-06-08 ENCOUNTER — Other Ambulatory Visit (HOSPITAL_BASED_OUTPATIENT_CLINIC_OR_DEPARTMENT_OTHER): Payer: Self-pay

## 2021-06-08 ENCOUNTER — Encounter (HOSPITAL_BASED_OUTPATIENT_CLINIC_OR_DEPARTMENT_OTHER): Payer: Self-pay | Admitting: *Deleted

## 2021-06-08 ENCOUNTER — Other Ambulatory Visit: Payer: Self-pay

## 2021-06-08 ENCOUNTER — Emergency Department (HOSPITAL_BASED_OUTPATIENT_CLINIC_OR_DEPARTMENT_OTHER)
Admission: EM | Admit: 2021-06-08 | Discharge: 2021-06-08 | Disposition: A | Discharge status: Home / Self Care | Payer: Medicare Other | Attending: Emergency Medicine | Admitting: Emergency Medicine

## 2021-06-08 ENCOUNTER — Ambulatory Visit
Admission: RE | Admit: 2021-06-08 | Discharge: 2021-06-08 | Disposition: A | Discharge status: Home / Self Care | Payer: Medicare Other | Source: Ambulatory Visit | Attending: Student | Admitting: Student

## 2021-06-08 DIAGNOSIS — I82402 Acute embolism and thrombosis of unspecified deep veins of left lower extremity: Secondary | ICD-10-CM | POA: Insufficient documentation

## 2021-06-08 DIAGNOSIS — Z7902 Long term (current) use of antithrombotics/antiplatelets: Secondary | ICD-10-CM | POA: Insufficient documentation

## 2021-06-08 DIAGNOSIS — Z91040 Latex allergy status: Secondary | ICD-10-CM | POA: Diagnosis not present

## 2021-06-08 DIAGNOSIS — Z7984 Long term (current) use of oral hypoglycemic drugs: Secondary | ICD-10-CM | POA: Insufficient documentation

## 2021-06-08 DIAGNOSIS — Z8673 Personal history of transient ischemic attack (TIA), and cerebral infarction without residual deficits: Secondary | ICD-10-CM | POA: Insufficient documentation

## 2021-06-08 DIAGNOSIS — M79605 Pain in left leg: Secondary | ICD-10-CM | POA: Diagnosis present

## 2021-06-08 DIAGNOSIS — E119 Type 2 diabetes mellitus without complications: Secondary | ICD-10-CM | POA: Diagnosis not present

## 2021-06-08 DIAGNOSIS — I82412 Acute embolism and thrombosis of left femoral vein: Secondary | ICD-10-CM | POA: Diagnosis not present

## 2021-06-08 DIAGNOSIS — R600 Localized edema: Secondary | ICD-10-CM

## 2021-06-08 DIAGNOSIS — D689 Coagulation defect, unspecified: Secondary | ICD-10-CM

## 2021-06-08 LAB — BASIC METABOLIC PANEL
Anion gap: 9 (ref 5–15)
BUN: 18 mg/dL (ref 8–23)
CO2: 25 mmol/L (ref 22–32)
Calcium: 9.9 mg/dL (ref 8.9–10.3)
Chloride: 102 mmol/L (ref 98–111)
Creatinine, Ser: 1.15 mg/dL (ref 0.61–1.24)
GFR, Estimated: >60 mL/min (ref >60)
Glucose, Bld: 407 mg/dL — ABNORMAL HIGH (ref 70–99)
Potassium: 4.6 mmol/L (ref 3.5–5.1)
Sodium: 136 mmol/L (ref 135–145)

## 2021-06-08 MED ORDER — OXYCODONE-ACETAMINOPHEN 5-325 MG PO TABS: 0.5000 | ORAL_TABLET | Freq: Four times a day (QID) | ORAL | 0 refills | Status: DC | PRN

## 2021-06-08 MED ORDER — APIXABAN (ELIQUIS) VTE STARTER PACK (10MG AND 5MG)
ORAL_TABLET | ORAL | 0 refills | Status: DC
  Filled 2021-06-08: qty 74, 30d supply, fill #0

## 2021-06-08 MED ORDER — APIXABAN 2.5 MG PO TABS
10.0000 mg | ORAL_TABLET | Freq: Two times a day (BID) | ORAL | Status: DC
  Administered 2021-06-08: 10 mg via ORAL
  Filled 2021-06-08: qty 4

## 2021-06-08 NOTE — ED Notes (Signed)
Dc instructions and scripts reviewed with pt no questions or concerns at this time. Will follow up with vascular speciality

## 2021-06-08 NOTE — Progress Notes (Signed)
Patient's final results of [place] left lower extremity venous duplex called to provider Dr. Valentina Gu at [phone]. Per Wells Guiles, Dr is instructing patient to go to ER for evaluation and treatment due to extensive DVT findings. 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 Much of this thrombus is partially echogenic which would support some degree of chronicity. Acute on chronic thrombus cannot be excluded.  Electronically Signed By: Aletta Edouard M.D. On: 06/08/2021 10:31

## 2021-06-08 NOTE — ED Triage Notes (Signed)
Pt is here after venous doppler that showed blood clot in left lower leg.  Pt states that swelling and pain began Friday.  No sob with this.

## 2021-06-08 NOTE — ED Provider Notes (Signed)
[facility] DEPT Provider Note   CSN: [id number] Arrival date & time: 06/08/21  1138     History  Chief Complaint  Patient presents with   DVT    Elijah Sandoval is a 71 y.o. male.  Patient is a 71 year old male with a history of diabetes, stroke who is currently on Plavix and presenting today due to persistent left leg pain and saw his PCP yesterday and had an ultrasound done that showed extensive left DVT.  Patient has no prior history of DVT denies any recent immobilizations, hospitalizations or injury to the leg.  He denies any shortness of breath, exertional dyspnea, palpitations or chest pain.  He currently has been taking his Plavix daily.  He was sent here by his PCP for further evaluation.  The history is provided by the patient and medical records.      Home Medications Prior to Admission medications   Medication Sig Start Date End Date Taking? Authorizing Provider  APIXABAN (ELIQUIS) VTE STARTER PACK (10MG  AND 5MG ) Take as directed on package: start with two-5mg  tablets twice daily for 7 days. On day 8, switch to one-5mg  tablet twice daily. 06/08/21  Yes Ladasia Sircy, Loree Fee, MD  amLODipine (NORVASC) 5 MG tablet Take 5 mg by mouth daily. 05/05/20   [provider]  atorvastatin (LIPITOR) 40 MG tablet Take 1 tablet (40 mg total) by mouth daily at 6 PM. Patient taking differently: Take 40 mg by mouth daily. 01/20/12   Geradine Girt, DO  clopidogrel (PLAVIX) 75 MG tablet Take 1 tablet (75 mg total) by mouth daily with breakfast. 09/29/13   Philmore Pali, NP  famotidine (PEPCID) 20 MG tablet Take 1 tablet (20 mg total) by mouth 2 (two) times daily as needed (itching). 07/08/19   Fawze, Mina A, PA-C  ferrous sulfate 325 (65 FE) MG tablet Take 325 mg by mouth daily. 12/27/19   [provider]  glipiZIDE (GLUCOTROL) 10 MG tablet Take 10 mg by mouth 2 (two) times daily. 02/22/20   [provider]  guaiFENesin-dextromethorphan (ROBITUSSIN DM)  100-10 MG/5ML syrup Take 10 mLs by mouth every 4 (four) hours as needed for cough. 05/09/20   Caren Griffins, MD  latanoprost (XALATAN) 0.005 % ophthalmic solution Place 1 drop into both eyes at bedtime. 04/17/20   [provider]  lisinopril (ZESTRIL) 40 MG tablet Take 40 mg by mouth daily. 03/15/20   [provider]  metoprolol succinate (TOPROL-XL) 100 MG 24 hr tablet Take 100 mg by mouth daily. 12/23/18   [provider]  pantoprazole (PROTONIX) 40 MG tablet Take 40 mg by mouth 2 (two) times daily. 12/18/19   [provider]  triamcinolone cream (KENALOG) 0.1 % Apply 1 application topically 2 (two) times daily. 07/08/19   Rodell Perna A, PA-C      Allergies    Latex    Review of Systems   Review of Systems  Physical Exam Updated Vital Signs BP (!) 182/75 (BP Location: Right Arm)    Pulse 74    Temp 97.9 F (36.6 C)    Resp 18    Wt 81.2 kg    SpO2 100%    BMI 22.37 kg/m  Physical Exam Vitals and nursing note reviewed.  Constitutional:      General: He is not in acute distress.    Appearance: He is well-developed.  HENT:     Head: Normocephalic and atraumatic.  Eyes:     Conjunctiva/sclera: Conjunctivae normal.  Pupils: Pupils are equal, round, and reactive to light.  Cardiovascular:     Rate and Rhythm: Normal rate and regular rhythm.     Heart sounds: No murmur heard. Pulmonary:     Effort: Pulmonary effort is normal. No respiratory distress.     Breath sounds: Normal breath sounds. No wheezing or rales.  Abdominal:     General: There is no distension.     Palpations: Abdomen is soft.     Tenderness: There is no abdominal tenderness. There is no guarding or rebound.  Musculoskeletal:        General: No tenderness. Normal range of motion.     Cervical back: Normal range of motion and neck supple.     Left lower leg: Edema present.     Comments: Swelling noted to the left lower extremity from the thigh down.  Tenderness with palpation  along the medial thigh and calf.  2+ DP pulse present with normal sensation and capillary refill less than 3 seconds  Skin:    General: Skin is warm and dry.     Findings: No erythema or rash.  Neurological:     Mental Status: He is alert and oriented to person, place, and time. Mental status is at baseline.  Psychiatric:        Mood and Affect: Mood normal.        Behavior: Behavior normal.    ED Results / Procedures / Treatments   Labs (all labs ordered are listed, but only abnormal results are displayed) Labs Reviewed  BASIC METABOLIC PANEL - Abnormal; Notable for the following components:      Result Value   Glucose, Bld 407 (*)    All other components within normal limits    EKG None  Radiology US Venous Img Lower Unilateral Left (DVT)  Result Date: 06/08/2021 CLINICAL DATA:  Left lower extremity pain and edema. EXAM: LEFT LOWER EXTREMITY VENOUS DOPPLER ULTRASOUND TECHNIQUE: Gray-scale sonography with graded compression, as well as color Doppler and duplex ultrasound were performed to evaluate the lower extremity deep venous systems from the level of the common femoral vein and including the common femoral, femoral, profunda femoral, popliteal and calf veins including the posterior tibial, peroneal and gastrocnemius veins when visible. The superficial great saphenous vein was also interrogated. Spectral Doppler was utilized to evaluate flow at rest and with distal augmentation maneuvers in the common femoral, femoral and popliteal veins. COMPARISON:  None. FINDINGS: Contralateral Common Femoral Vein: Respiratory phasicity is normal and symmetric with the symptomatic side. No evidence of thrombus. Normal compressibility. Common Femoral Vein: Occlusive thrombus present. Some of this thrombus is echogenic which would implicate some degree of chronicity. Saphenofemoral Junction: Thrombus present. Profunda Femoral Vein: No evidence of thrombus. Normal compressibility and flow on color  Doppler imaging. Femoral Vein: Occlusive thrombus present. This thrombus is partially echogenic. Popliteal Vein: Occlusive thrombus present. This thrombus is partially echogenic. Calf Veins: Thrombus present in visualized segments of the left posterior tibial and peroneal veins. Superficial Great Saphenous Vein: No evidence of thrombus. Normal compressibility. Venous Reflux:  None. Other Findings: No evidence of superficial thrombophlebitis or abnormal fluid collection. IMPRESSION: Extensive occlusive thrombus in the deep veins of the left lower extremity including the common femoral vein, femoral vein, popliteal vein and visualized calf veins. Much of this thrombus is partially echogenic which would support some degree of chronicity. Acute on chronic thrombus cannot be excluded. Electronically Signed   By: Aletta Edouard M.D.   On: 06/08/2021 10:31  Procedures Procedures    Medications Ordered in ED Medications  apixaban (ELIQUIS) tablet 10 mg (has no administration in time range)    ED Course/ Medical Decision Making/ A&P                           Medical Decision Making Amount and/or Complexity of Data Reviewed External Data Reviewed: radiology and notes. Labs: ordered. Decision-making details documented in ED Course.  Risk Prescription drug management.   Patient is an elderly male presenting today with 1 week of left leg pain.  Based on external evaluation of medical records he had an ultrasound done yesterday that showed extensive occlusive thrombus in the deep veins of the left lower extremity including the common femoral, femoral, popliteal and calf veins.  There was question whether some of the thrombus had a degree of chronicity and could not exclude acute on chronic.  Patient however reports no prior history of clots.  He does take Plavix daily for prior stroke.  He has no other known risk factors other than age.  BMP pending to determine anticoagulant he needs to go on.  Will  discuss with vascular surgery for follow-up purposes.  He denies any symptoms to suggest PE or acute cardiac involvement.  1:49 PM Discussed the case with Dr. Carlis Abbott vascular surgery and pt will need to have his left lower leg wrapped.  He was started on eliquis as his BMP showed normal GFR.  He will discontinue plavix.  Patient was given warning signs on reasons to return over the weekend to the hospital.  His BMP also had a elevated blood sugar today of 400 and he was instructed to take his medication when he got home.  Patient is otherwise well-appearing.  Pharmacy gave patient instructions on the new medication.  At this time no indication for admission and patient was discharged home.        Final Clinical Impression(s) / ED Diagnoses Final diagnoses:  Acute deep vein thrombosis (DVT) of femoral vein of left lower extremity ([place])    Rx / DC Orders ED Discharge Orders          Ordered    APIXABAN (ELIQUIS) VTE STARTER PACK (10MG  AND 5MG )        06/08/21 1334              Blanchie Dessert, MD 06/08/21 1358

## 2021-06-08 NOTE — Discharge Instructions (Addendum)
Take a dose of your blood thinner Eliquis tonight before bed and you will take it 2 times a day from then on.  Stop taking your plavix.  You were given a prescription for pain medication which was sent to your pharmacy. Wrap your leg daily and elevate.   If the swelling in your leg starts getting worse, you have any breathing trouble or start getting chest pain or passing out then go to the [facility] ER.   Information on my medicine - ELIQUIS (apixaban)  This medication education was reviewed with me or my healthcare representative as part of my discharge preparation.    Why was Eliquis prescribed for you? Eliquis was prescribed to treat blood clots that may have been found in the veins of your legs (deep vein thrombosis) or in your lungs (pulmonary embolism) and to reduce the risk of them occurring again.  What do You need to know about Eliquis ? The starting dose is 10 mg (two 5 mg tablets) taken TWICE daily for the FIRST SEVEN (7) DAYS, then the dose is reduced to ONE 5 mg tablet taken TWICE daily.  Eliquis may be taken with or without food.   Try to take the dose about the same time in the morning and in the evening. If you have difficulty swallowing the tablet whole please discuss with your pharmacist how to take the medication safely.  Take Eliquis exactly as prescribed and DO NOT stop taking Eliquis without talking to the doctor who prescribed the medication.  Stopping may increase your risk of developing a new blood clot.  Refill your prescription before you run out.  After discharge, you should have regular check-up appointments with your healthcare provider that is prescribing your Eliquis.    What do you do if you miss a dose? If a dose of ELIQUIS is not taken at the scheduled time, take it as soon as possible on the same day and twice-daily administration should be resumed. The dose should not be doubled to make up for a missed dose.  Important Safety Information A  possible side effect of Eliquis is bleeding. You should call your healthcare provider right away if you experience any of the following: Bleeding from an injury or your nose that does not stop. Unusual colored urine (red or dark brown) or unusual colored stools (red or black). Unusual bruising for unknown reasons. A serious fall or if you hit your head (even if there is no bleeding).  Some medicines may interact with Eliquis and might increase your risk of bleeding or clotting while on Eliquis. To help avoid this, consult your healthcare provider or pharmacist prior to using any new prescription or non-prescription medications, including herbals, vitamins, non-steroidal anti-inflammatory drugs (NSAIDs) and supplements.  This website has more information on Eliquis (apixaban): http://www.eliquis.com/eliquis/home

## 2021-07-10 ENCOUNTER — Encounter: Payer: Self-pay | Admitting: Vascular Surgery

## 2021-07-10 ENCOUNTER — Other Ambulatory Visit: Payer: Self-pay

## 2021-07-10 ENCOUNTER — Ambulatory Visit: Payer: Medicare Other | Admitting: Vascular Surgery

## 2021-07-10 DIAGNOSIS — I82409 Acute embolism and thrombosis of unspecified deep veins of unspecified lower extremity: Secondary | ICD-10-CM | POA: Insufficient documentation

## 2021-07-10 DIAGNOSIS — I82412 Acute embolism and thrombosis of left femoral vein: Secondary | ICD-10-CM | POA: Diagnosis not present

## 2021-07-10 MED ORDER — APIXABAN 5 MG PO TABS: 5.0000 mg | ORAL_TABLET | Freq: Two times a day (BID) | ORAL | 0 refills | Status: DC

## 2021-07-10 NOTE — Progress Notes (Signed)
? ? ?Patient name: Elijah Sandoval MRN: [id number] DOB: 06-15-1950 Sex: male ? ?REASON FOR CONSULT: Evaluate left leg DVT ? ?HPI: ?Elijah Sandoval is a 71 y.o. male, with history of diabetes that presents for evaluation of left leg DVT.  States he was in his normal state of health last month when he noticed pain and swelling in his left leg.  He was seen in the ED on 06/08/2021 and diagnosed with extensive occlusive thrombus in the left lower extremity including the common femoral vein, femoral vein, popliteal vein, tibial veins.  He states this is his first DVT.  He has no history of clotting disorders or associated malignancies.  He has been tolerating Eliquis.  He is having no pain in his leg now.  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  Not wearing compression.  He does smoke about 6 cigarettes daily. ? ?Past Medical History:  ?Diagnosis Date  ? Diabetes mellitus   ? Stroke [facility])   ? Pt reports TIA in 2013  ? Transfusion history   ? ? ?Past Surgical History:  ?Procedure Laterality Date  ? NO PAST SURGERIES    ? ? ?Family History  ?Problem Relation Age of Onset  ? Cancer Sister   ? Cancer Sister   ? ? ?SOCIAL HISTORY: ?Social History  ? ?Socioeconomic History  ? Marital status: Married  ?  Spouse name: [name]  ? Number of children: 3  ? Years of education: HS  ? Highest education level: Not on file  ?Occupational History  ? Occupation: self employed  ?  Employer: [employer]  ?Tobacco Use  ? Smoking status: Former  ?  Packs/day: 0.25  ?  Years: 15.00  ?  Pack years: 3.75  ?  Types: Cigarettes  ? Smokeless tobacco: Never  ?Vaping Use  ? Vaping Use: Never used  ?Substance and Sexual Activity  ? Alcohol use: No  ? Drug use: No  ? Sexual activity: Not on file  ?Other Topics Concern  ? Not on file  ?Social History Narrative  ? Patient lives at home with spouse.  ? Caffeine Use: Coffee on weekends  ? ?Social Determinants of Health  ? ?Financial Resource Strain: Not on file  ?Food Insecurity: Not on file   ?Transportation Needs: Not on file  ?Physical Activity: Not on file  ?Stress: Not on file  ?Social Connections: Not on file  ?Intimate Partner Violence: Not on file  ? ? ?Allergies  ?Allergen Reactions  ? Latex   ?  Other reaction(s): Unknown  ? ? ?Current Outpatient Medications  ?Medication Sig Dispense Refill  ? amLODipine (NORVASC) 5 MG tablet Take 5 mg by mouth daily.    ? APIXABAN (ELIQUIS) VTE STARTER PACK ('10MG'$  AND '5MG'$ ) Take as directed on package: start with two-'5mg'$  tablets twice daily for 7 days. On day 8, switch to one-'5mg'$  tablet twice daily. 74 tablet 0  ? atorvastatin (LIPITOR) 40 MG tablet Take 1 tablet (40 mg total) by mouth daily at 6 PM. (Patient taking differently: Take 40 mg by mouth daily.) 30 tablet 0  ? clopidogrel (PLAVIX) 75 MG tablet Take 1 tablet (75 mg total) by mouth daily with breakfast. 90 tablet 3  ? famotidine (PEPCID) 20 MG tablet Take 1 tablet (20 mg total) by mouth 2 (two) times daily as needed (itching). 30 tablet 0  ? ferrous sulfate 325 (65 FE) MG tablet Take 325 mg by mouth daily.    ? glipiZIDE (GLUCOTROL) 10 MG tablet  Take 10 mg by mouth 2 (two) times daily.    ? guaiFENesin-dextromethorphan (ROBITUSSIN DM) 100-10 MG/5ML syrup Take 10 mLs by mouth every 4 (four) hours as needed for cough. 118 mL 0  ? latanoprost (XALATAN) 0.005 % ophthalmic solution Place 1 drop into both eyes at bedtime.    ? lisinopril (ZESTRIL) 40 MG tablet Take 40 mg by mouth daily.    ? metoprolol succinate (TOPROL-XL) 100 MG 24 hr tablet Take 100 mg by mouth daily.    ? pantoprazole (PROTONIX) 40 MG tablet Take 40 mg by mouth 2 (two) times daily.    ? triamcinolone cream (KENALOG) 0.1 % Apply 1 application topically 2 (two) times daily. 30 g 0  ? oxyCODONE-acetaminophen (PERCOCET/ROXICET) 5-325 MG tablet Take 0.5 tablets by mouth every 6 (six) hours as needed for severe pain. (Patient not taking: Reported on 07/10/2021) 10 tablet 0  ? ?No current facility-administered medications for this visit.   ? ? ?REVIEW OF SYSTEMS:  ?'[X]'$  denotes positive finding, '[ ]'$  denotes negative finding ?Cardiac  Comments:  ?Chest pain or chest pressure:    ?Shortness of breath upon exertion:    ?Short of breath when lying flat:    ?Irregular heart rhythm:    ?    ?Vascular    ?Pain in calf, thigh, or hip brought on by ambulation:    ?Pain in feet at night that wakes you up from your sleep:     ?Blood clot in your veins:    ?Leg swelling:  x Minimal left leg swelling  ?    ?Pulmonary    ?Oxygen at home:    ?Productive cough:     ?Wheezing:     ?    ?Neurologic    ?Sudden weakness in arms or legs:     ?Sudden numbness in arms or legs:     ?Sudden onset of difficulty speaking or slurred speech:    ?Temporary loss of vision in one eye:     ?Problems with dizziness:     ?    ?Gastrointestinal    ?Blood in stool:     ?Vomited blood:     ?    ?Genitourinary    ?Burning when urinating:     ?Blood in urine:    ?    ?Psychiatric    ?Major depression:     ?    ?Hematologic    ?Bleeding problems:    ?Problems with blood clotting too easily:    ?    ?Skin    ?Rashes or ulcers:    ?    ?Constitutional    ?Fever or chills:    ? ? ?PHYSICAL EXAM: ?Vitals:  ? 07/10/21 1058  ?BP: 109/61  ?Pulse: 74  ?Resp: 18  ?Temp: (!) 97 ?F (36.1 ?C)  ?TempSrc: Temporal  ?SpO2: 97%  ?Weight: 174 lb (78.9 kg)  ?Height: '6\' 3"'$  (1.905 m)  ? ? ?GENERAL: The patient is a well-nourished male, in no acute distress. The vital signs are documented above. ?CARDIAC: There is a regular rate and rhythm.  ?VASCULAR:  ?Palpable femoral pulses bilaterally ?No palpable pedal pulses ?Minimal edema on the left leg ?PULMONARY: No respiratory distress. ?ABDOMEN: Soft and non-tender. ?MUSCULOSKELETAL: There are no major deformities or cyanosis. ?NEUROLOGIC: No focal weakness or paresthesias are detected. ?SKIN: There are no ulcers or rashes noted. ?PSYCHIATRIC: The patient has a normal affect. ? ?DATA:  ? ?DVT study 06/08/21 ? ?CLINICAL DATA:  Left lower extremity pain and edema. ?   ?  EXAM: ?LEFT LOWER EXTREMITY VENOUS DOPPLER ULTRASOUND ?  ?TECHNIQUE: ?Gray-scale sonography with graded compression, as well as color ?Doppler and duplex ultrasound were performed to evaluate the lower ?extremity deep venous systems from the level of the common femoral ?vein and including the common femoral, femoral, profunda femoral, ?popliteal and calf veins including the posterior tibial, peroneal ?and gastrocnemius veins when visible. The superficial great ?saphenous vein was also interrogated. Spectral Doppler was utilized ?to evaluate flow at rest and with distal augmentation maneuvers in ?the common femoral, femoral and popliteal veins. ?  ?COMPARISON:  None. ?  ?FINDINGS: ?Contralateral Common Femoral Vein: Respiratory phasicity is normal ?and symmetric with the symptomatic side. No evidence of thrombus. ?Normal compressibility. ?  ?Common Femoral Vein: Occlusive thrombus present. Some of this ?thrombus is echogenic which would implicate some degree of ?chronicity. ?  ?Saphenofemoral Junction: Thrombus present. ?  ?Profunda Femoral Vein: No evidence of thrombus. Normal ?compressibility and flow on color Doppler imaging. ?  ?Femoral Vein: Occlusive thrombus present. This thrombus is partially ?echogenic. ?  ?Popliteal Vein: Occlusive thrombus present. This thrombus is ?partially echogenic. ?  ?Calf Veins: Thrombus present in visualized segments of the left ?posterior tibial and peroneal veins. ?  ?Superficial Great Saphenous Vein: No evidence of thrombus. Normal ?compressibility. ?  ?Venous Reflux:  None. ?  ?Other Findings: No evidence of superficial thrombophlebitis or ?abnormal fluid collection. ?  ?IMPRESSION: ?Extensive occlusive thrombus in the deep veins of the left lower ?extremity including the common femoral vein, femoral vein, popliteal ?vein and visualized calf veins. Much of this thrombus is partially ?echogenic which would support some degree of chronicity. Acute on ?chronic thrombus cannot be  excluded. ? ?Assessment/Plan: ? ?71 year old male presents for evaluation of extensive left leg DVT diagnosed on 06/08/2021 in the ED.  As duplex suggests there is some aspect of chronicity and he had a patent profund

## 2021-07-30 ENCOUNTER — Telehealth: Payer: Self-pay | Admitting: Pulmonary Disease

## 2021-07-31 NOTE — Telephone Encounter (Signed)
Spoke with the pt  ?He is c/o increased SOB and dry cough over the past several wks  ?Requesting inhaler to be called in  ?Last seen Aug 2022  ?I scheduled him for appt with TP 08/02/21 ?Advised to seek emergent care sooner if needed ?

## 2021-08-02 ENCOUNTER — Ambulatory Visit: Payer: Medicare Other | Admitting: Adult Health

## 2021-08-02 ENCOUNTER — Encounter: Payer: Self-pay | Admitting: Adult Health

## 2021-08-02 ENCOUNTER — Ambulatory Visit (INDEPENDENT_AMBULATORY_CARE_PROVIDER_SITE_OTHER): Payer: Medicare Other

## 2021-08-02 VITALS — BP 100/60 | HR 88 | Temp 98.2°F | Ht 75.0 in | Wt 174.0 lb

## 2021-08-02 DIAGNOSIS — R0600 Dyspnea, unspecified: Secondary | ICD-10-CM | POA: Insufficient documentation

## 2021-08-02 DIAGNOSIS — Z720 Tobacco use: Secondary | ICD-10-CM

## 2021-08-02 DIAGNOSIS — D649 Anemia, unspecified: Secondary | ICD-10-CM | POA: Diagnosis not present

## 2021-08-02 DIAGNOSIS — I2699 Other pulmonary embolism without acute cor pulmonale: Secondary | ICD-10-CM | POA: Diagnosis not present

## 2021-08-02 DIAGNOSIS — J432 Centrilobular emphysema: Secondary | ICD-10-CM | POA: Insufficient documentation

## 2021-08-02 DIAGNOSIS — R0609 Other forms of dyspnea: Secondary | ICD-10-CM | POA: Diagnosis not present

## 2021-08-02 DIAGNOSIS — I10 Essential (primary) hypertension: Secondary | ICD-10-CM

## 2021-08-02 MED ORDER — STIOLTO RESPIMAT 2.5-2.5 MCG/ACT IN AERS: 2.0000 | INHALATION_SPRAY | Freq: Every day | RESPIRATORY_TRACT | 0 refills | Status: AC

## 2021-08-02 NOTE — Assessment & Plan Note (Signed)
Smoking cessation discussed 

## 2021-08-02 NOTE — Assessment & Plan Note (Signed)
Patient blood pressure is on the low end of normal today. ?Patient has good appetite with no nausea vomiting or diarrhea.  Patient is advised to follow-up with primary care provider.  If blood pressure continues to be low normal may need adjustments of hypertension medications ?CBC is pending ?

## 2021-08-02 NOTE — Progress Notes (Signed)
? ?'@Patient'$  ID: Elijah Sandoval, male    DOB: 1950-04-23, 71 y.o.   MRN: [id number] ? ?Chief Complaint  ?Patient presents with  ? Acute Visit  ? ? ?Referring provider: ?Sonia Side., FNP ? ?HPI: ?71 year old male  seen for pulmonary consult November 2021 for emphysema and lung nodule.   ?COVID-19 infection January 2022-admitted with acute respiratory failure and COVID-pneumonia treated with steroids and remdesivir discharged on oxygen (weaned off of oxygen August 2022) ?Medical history significant for diabetes, stroke ? ?TEST/EVENTS :  ?CT chest December 15, 2020 showed resolved bilateral opacities most likely related to previous COVID-pneumonia, stable emphysema, stable bilateral pulmonary nodules measuring 5 mm max, stable prominent mediastinal and axillary lymph nodes ? ?PFTs November 15, 2020 showed mild to moderate airflow obstruction with FEV1 at 67, ratio 70, FVC 73%, DLCO 36%. ? ? ?08/02/2021 Acute OV: COPD /Emphysema  ?Patient presents for an acute office visit.  Patient complains over the last 2 months that he has had progressive shortness of breath with decreased activity tolerance. ?Patient was recently diagnosed with extensive left DVT February 2023. He developed acute leg pain.  He was set up for venous Dopplers that showed extensive occlusive thrombus in the deep veins of the left lower extremity involving the common femoral vein, femoral vein, popliteal and visualized calf veins.  Much of the thrombus is partially echogenic which would support some degree of chronicity. CT chest showed several left sided pulmonary emboli with small burden. No heart strain.  ?He denies any known injury.  No recent surgeries.  No recent travel.  Had COVID-19 but greater than a year ago, no known history of VTE no known family history of VTE.  No known injury.  Patient is active works as a Curator.  He was started on Eliquis.  He developed worsening dyspnea . Went to [facility] in [place]. Found to have bilateral PE .  Admitted tx with Heparin and discharged on Elquis . Patient completed Eliquis starter pack.  And is on maintenance dose Eliquis 5 mg twice daily.  He endorses compliance. ?Patient denies any known bleeding.  No bloody stools.  No hematuria.  Patient says he just has no energy.  Has a hard time walking any long distance gives out easily.  He denies any increased leg swelling.  No orthopnea.  No weight gain. ?Walk test in office shows no significant desaturations with O2 saturations maintaining 92 to 100% on room air walking. ?Patient denies any increased cough.  Says he occasionally has some wheezing.  He bought a Comptroller over-the-counter. ?As above patient has known COPD and emphysema. ?Patient has a history of stroke.  Plavix is on hold currently. ? ? ? ? ?Allergies  ?Allergen Reactions  ? Latex   ?  Other reaction(s): Unknown  ? ? ?Immunization History  ?Administered Date(s) Administered  ? Fluad Quad(high Dose 65+) 01/12/2021  ? Influenza-Unspecified 12/27/2019  ? Moderna Sars-Covid-2 Vaccination 06/18/2019, 07/16/2019  ? ? ?Past Medical History:  ?Diagnosis Date  ? Diabetes mellitus   ? Stroke [facility])   ? Pt reports TIA in 2013  ? Transfusion history   ? ? ?Tobacco History: ?Social History  ? ?Tobacco Use  ?Smoking Status Every Day  ? Packs/day: 0.50  ? Years: 15.00  ? Pack years: 7.50  ? Types: Cigarettes  ?Smokeless Tobacco Never  ?Tobacco Comments  ? Smoking a few a day.  08/02/2021 hfb  ? ?Ready to quit: Not Answered ?Counseling given: Not Answered ?Tobacco  comments: Smoking a few a day.  08/02/2021 hfb ? ? ?Outpatient Medications Prior to Visit  ?Medication Sig Dispense Refill  ? amLODipine (NORVASC) 5 MG tablet Take 5 mg by mouth daily.    ? APIXABAN (ELIQUIS) VTE STARTER PACK ('10MG'$  AND '5MG'$ ) Take as directed on package: start with two-'5mg'$  tablets twice daily for 7 days. On day 8, switch to one-'5mg'$  tablet twice daily. 74 tablet 0  ? atorvastatin (LIPITOR) 40 MG tablet Take 1 tablet (40 mg total)  by mouth daily at 6 PM. (Patient taking differently: Take 40 mg by mouth daily.) 30 tablet 0  ? clopidogrel (PLAVIX) 75 MG tablet Take 1 tablet (75 mg total) by mouth daily with breakfast. 90 tablet 3  ? famotidine (PEPCID) 20 MG tablet Take 1 tablet (20 mg total) by mouth 2 (two) times daily as needed (itching). 30 tablet 0  ? ferrous sulfate 325 (65 FE) MG tablet Take 325 mg by mouth daily.    ? glipiZIDE (GLUCOTROL) 10 MG tablet Take 10 mg by mouth 2 (two) times daily.    ? guaiFENesin-dextromethorphan (ROBITUSSIN DM) 100-10 MG/5ML syrup Take 10 mLs by mouth every 4 (four) hours as needed for cough. 118 mL 0  ? latanoprost (XALATAN) 0.005 % ophthalmic solution Place 1 drop into both eyes at bedtime.    ? lisinopril (ZESTRIL) 40 MG tablet Take 40 mg by mouth daily.    ? metoprolol succinate (TOPROL-XL) 100 MG 24 hr tablet Take 100 mg by mouth daily.    ? oxyCODONE-acetaminophen (PERCOCET/ROXICET) 5-325 MG tablet Take 0.5 tablets by mouth every 6 (six) hours as needed for severe pain. 10 tablet 0  ? pantoprazole (PROTONIX) 40 MG tablet Take 40 mg by mouth 2 (two) times daily.    ? triamcinolone cream (KENALOG) 0.1 % Apply 1 application topically 2 (two) times daily. 30 g 0  ? apixaban (ELIQUIS) 5 MG TABS tablet Take 1 tablet (5 mg total) by mouth 2 (two) times daily. (Patient not taking: Reported on 08/02/2021) 60 tablet 0  ? ?No facility-administered medications prior to visit.  ? ? ? ?Review of Systems:  ? ?Constitutional:   No  weight loss, night sweats,  Fevers, chills,  ?+fatigue, or  lassitude. ? ?HEENT:   No headaches,  Difficulty swallowing,  Tooth/dental problems, or  Sore throat,  ?              No sneezing, itching, ear ache, nasal congestion, post nasal drip,  ? ?CV:  No chest pain,  Orthopnea, PND, swelling in lower extremities, anasarca, dizziness, palpitations, syncope.  ? ?GI  No heartburn, indigestion, abdominal pain, nausea, vomiting, diarrhea, change in bowel habits, loss of appetite, bloody  stools.  ? ?Resp:  No chest wall deformity ? ?Skin: no rash or lesions. ? ?GU: no dysuria, change in color of urine, no urgency or frequency.  No flank pain, no hematuria  ? ?MS:  No joint pain or swelling.  No decreased range of motion.  No back pain. ? ? ? ?Physical Exam ? ?BP 100/60 (BP Location: Left Arm, Patient Position: Sitting, Cuff Size: Normal)   Pulse 88   Temp 98.2 ?F (36.8 ?C) (Oral)   Ht '6\' 3"'$  (1.905 m)   Wt 174 lb (78.9 kg)   SpO2 96%   BMI 21.75 kg/m?  ? ?GEN: A/Ox3; pleasant , NAD, thin and tall ?  ?HEENT:  [place]/AT,  NOSE-clear, THROAT-clear, no lesions, no postnasal drip or exudate noted.  ? ?NECK:  Supple  w/ fair ROM; no JVD; normal carotid impulses w/o bruits; no thyromegaly or nodules palpated; no lymphadenopathy.   ? ?RESP  Clear  P & A; w/o, wheezes/ rales/ or rhonchi. no accessory muscle use, no dullness to percussion ? ?CARD:  RRR, no m/r/g, tr peripheral edema, pulses intact, no cyanosis or clubbing. ? ?GI:   Soft & nt; nml bowel sounds; no organomegaly or masses detected.  ? ?Musco: Warm bil, no deformities or joint swelling noted.  ? ?Neuro: alert, no focal deficits noted.   ? ?Skin: Warm, no lesions or rashes ? ? ? ?Lab Results: ? ?CBC ?   ?Component Value Date/Time  ? WBC 4.2 05/09/2020 0422  ? RBC 3.61 (L) 05/09/2020 0422  ? HGB 10.4 (L) 05/09/2020 0422  ? HCT 31.9 (L) 05/09/2020 0422  ? PLT 299 05/09/2020 0422  ? MCV 88.4 05/09/2020 0422  ? MCH 28.8 05/09/2020 0422  ? MCHC 32.6 05/09/2020 0422  ? RDW 12.9 05/09/2020 0422  ? LYMPHSABS 0.7 05/09/2020 0422  ? MONOABS 0.4 05/09/2020 0422  ? EOSABS 0.0 05/09/2020 0422  ? BASOSABS 0.0 05/09/2020 0422  ? ? ?BMET ?   ?Component Value Date/Time  ? NA 136 06/08/2021 1250  ? K 4.6 06/08/2021 1250  ? CL 102 06/08/2021 1250  ? CO2 25 06/08/2021 1250  ? GLUCOSE 407 (H) 06/08/2021 1250  ? BUN 18 06/08/2021 1250  ? CREATININE 1.15 06/08/2021 1250  ? CALCIUM 9.9 06/08/2021 1250  ? GFRNONAA >60 06/08/2021 1250  ? GFRAA >60 02/25/2019 1059   ? ? ?BNP ?No results found for: BNP ? ?ProBNP ?No results found for: PROBNP ? ?Imaging: ?No results found. ? ? ? ? ?  Latest Ref Rng & Units 11/15/2020  ?  2:41 PM  ?PFT Results  ?FVC-Pre L 3.42    ?FVC-Predicted Pre % 7

## 2021-08-02 NOTE — Assessment & Plan Note (Signed)
COPD with emphysema patient is encouraged on smoking cessation.  We will begin Stiolto 2 puffs daily. ? ?Plan  ?Patient Instructions  ?Begin Stiolto 2 puffs daily.  ?Continue on Eliquis '5mg'$  Twice daily   ?Labs today  ?Chest xray today  ?Set up for 2 D echo  ?Follow up with Dr. Hermina Staggers or Anjuli Gemmill NP in 2 weeks and As needed   ?Please contact office for sooner follow up if symptoms do not improve or worsen or seek emergency care  ? ? ' ? ?

## 2021-08-02 NOTE — Progress Notes (Signed)
Patient seen in the office today and instructed on use of stiolto.  Patient expressed understanding and demonstrated technique. 

## 2021-08-02 NOTE — Assessment & Plan Note (Signed)
Recently diagnosed PE with extensive left leg DVT-appears to be unprovoked.  Diagnosed in February 2023. ?Patient with ongoing symptom burden with shortness of breath decreased activity tolerance.  We will check 2D echo for possible pulmonary hypertension/right heart strain. ?Patient endorses compliance with Eliquis.  ?We will need to discuss on return visit longevity or therapy.  This is a unprovoked clot with extensive DVT and bilateral PE.  Would at least recommend 6 months of therapy vs lifelong. Consider hematology referral post therapy for hypercoagulable work-up ? ?Plan  ?Patient Instructions  ?Begin Stiolto 2 puffs daily.  ?Continue on Eliquis '5mg'$  Twice daily   ?Labs today  ?Chest xray today  ?Set up for 2 D echo  ?Follow up with Dr. Hermina Staggers or Anaysia Germer NP in 2 weeks and As needed   ?Please contact office for sooner follow up if symptoms do not improve or worsen or seek emergency care  ? ?  ? ? ?

## 2021-08-02 NOTE — Patient Instructions (Addendum)
Begin Stiolto 2 puffs daily.  ?Continue on Eliquis '5mg'$  Twice daily   ?Labs today  ?Chest xray today  ?Set up for 2 D echo  ?Follow up with Dr. Hermina Staggers or Shyra Emile NP in 2 weeks and As needed   ?Please contact office for sooner follow up if symptoms do not improve or worsen or seek emergency care  ? ?

## 2021-08-02 NOTE — Assessment & Plan Note (Signed)
Patient has a history of symptomatic anemia.  He is got progressive shortness of breath and weakness while taking Eliquis.  We will check CBC to rule out worsening anemia/possible bleeding ? ?Plan  ?Patient Instructions  ?Begin Stiolto 2 puffs daily.  ?Continue on Eliquis '5mg'$  Twice daily   ?Labs today  ?Chest xray today  ?Set up for 2 D echo  ?Follow up with Dr. Hermina Staggers or Mattilyn Crites NP in 2 weeks and As needed   ?Please contact office for sooner follow up if symptoms do not improve or worsen or seek emergency care  ? ?  ? ?

## 2021-08-02 NOTE — Assessment & Plan Note (Signed)
Dyspnea- suspect related to PE superimposed on COPD and emphysema. ?We will need to check 2D echo to rule out pulmonary hypertension/right heart strain, CBC for anemia, BNP ?Check chest x-ray. ? ?Plan  ?Patient Instructions  ?Begin Stiolto 2 puffs daily.  ?Continue on Eliquis '5mg'$  Twice daily   ?Labs today  ?Chest xray today  ?Set up for 2 D echo  ?Follow up with Dr. Hermina Staggers or Jairen Goldfarb NP in 2 weeks and As needed   ?Please contact office for sooner follow up if symptoms do not improve or worsen or seek emergency care  ? ?  ? ?

## 2021-08-03 ENCOUNTER — Emergency Department (HOSPITAL_COMMUNITY): Payer: Medicare Other

## 2021-08-03 ENCOUNTER — Encounter (HOSPITAL_COMMUNITY): Payer: Self-pay | Admitting: Emergency Medicine

## 2021-08-03 ENCOUNTER — Telehealth: Payer: Self-pay | Admitting: Adult Health

## 2021-08-03 ENCOUNTER — Inpatient Hospital Stay (HOSPITAL_COMMUNITY)
Admission: EM | Admit: 2021-08-03 | Discharge: 2021-08-06 | DRG: 378 | Disposition: A | Discharge status: Home / Self Care | Payer: Medicare Other | Attending: Internal Medicine | Admitting: Internal Medicine

## 2021-08-03 ENCOUNTER — Other Ambulatory Visit: Payer: Self-pay

## 2021-08-03 DIAGNOSIS — Z7984 Long term (current) use of oral hypoglycemic drugs: Secondary | ICD-10-CM

## 2021-08-03 DIAGNOSIS — K922 Gastrointestinal hemorrhage, unspecified: Secondary | ICD-10-CM | POA: Diagnosis not present

## 2021-08-03 DIAGNOSIS — N179 Acute kidney failure, unspecified: Secondary | ICD-10-CM

## 2021-08-03 DIAGNOSIS — K254 Chronic or unspecified gastric ulcer with hemorrhage: Secondary | ICD-10-CM | POA: Diagnosis present

## 2021-08-03 DIAGNOSIS — I82412 Acute embolism and thrombosis of left femoral vein: Secondary | ICD-10-CM

## 2021-08-03 DIAGNOSIS — K219 Gastro-esophageal reflux disease without esophagitis: Secondary | ICD-10-CM | POA: Diagnosis present

## 2021-08-03 DIAGNOSIS — Z720 Tobacco use: Secondary | ICD-10-CM | POA: Diagnosis present

## 2021-08-03 DIAGNOSIS — F17210 Nicotine dependence, cigarettes, uncomplicated: Secondary | ICD-10-CM | POA: Diagnosis present

## 2021-08-03 DIAGNOSIS — I10 Essential (primary) hypertension: Secondary | ICD-10-CM | POA: Diagnosis present

## 2021-08-03 DIAGNOSIS — D649 Anemia, unspecified: Secondary | ICD-10-CM | POA: Diagnosis present

## 2021-08-03 DIAGNOSIS — Z86711 Personal history of pulmonary embolism: Secondary | ICD-10-CM

## 2021-08-03 DIAGNOSIS — Z86718 Personal history of other venous thrombosis and embolism: Secondary | ICD-10-CM

## 2021-08-03 DIAGNOSIS — I82409 Acute embolism and thrombosis of unspecified deep veins of unspecified lower extremity: Secondary | ICD-10-CM | POA: Diagnosis present

## 2021-08-03 DIAGNOSIS — E871 Hypo-osmolality and hyponatremia: Secondary | ICD-10-CM

## 2021-08-03 DIAGNOSIS — K31811 Angiodysplasia of stomach and duodenum with bleeding: Secondary | ICD-10-CM | POA: Diagnosis not present

## 2021-08-03 DIAGNOSIS — D500 Iron deficiency anemia secondary to blood loss (chronic): Secondary | ICD-10-CM | POA: Diagnosis present

## 2021-08-03 DIAGNOSIS — E1165 Type 2 diabetes mellitus with hyperglycemia: Secondary | ICD-10-CM

## 2021-08-03 DIAGNOSIS — R791 Abnormal coagulation profile: Secondary | ICD-10-CM | POA: Diagnosis present

## 2021-08-03 DIAGNOSIS — D638 Anemia in other chronic diseases classified elsewhere: Secondary | ICD-10-CM | POA: Diagnosis present

## 2021-08-03 DIAGNOSIS — J432 Centrilobular emphysema: Secondary | ICD-10-CM | POA: Diagnosis present

## 2021-08-03 DIAGNOSIS — Z79899 Other long term (current) drug therapy: Secondary | ICD-10-CM

## 2021-08-03 DIAGNOSIS — E875 Hyperkalemia: Secondary | ICD-10-CM

## 2021-08-03 DIAGNOSIS — Z91040 Latex allergy status: Secondary | ICD-10-CM

## 2021-08-03 DIAGNOSIS — E785 Hyperlipidemia, unspecified: Secondary | ICD-10-CM | POA: Diagnosis present

## 2021-08-03 DIAGNOSIS — Z7901 Long term (current) use of anticoagulants: Secondary | ICD-10-CM

## 2021-08-03 DIAGNOSIS — Z800 Family history of malignant neoplasm of digestive organs: Secondary | ICD-10-CM

## 2021-08-03 DIAGNOSIS — I2699 Other pulmonary embolism without acute cor pulmonale: Secondary | ICD-10-CM | POA: Diagnosis present

## 2021-08-03 DIAGNOSIS — Z8673 Personal history of transient ischemic attack (TIA), and cerebral infarction without residual deficits: Secondary | ICD-10-CM

## 2021-08-03 LAB — CBC
HCT: 20.8 % — ABNORMAL LOW (ref 39.0–52.0)
Hemoglobin: 6.1 g/dL — CL (ref 13.0–17.0)
MCH: 21.9 pg — ABNORMAL LOW (ref 26.0–34.0)
MCHC: 29.3 g/dL — ABNORMAL LOW (ref 30.0–36.0)
MCV: 74.6 fL — ABNORMAL LOW (ref 80.0–100.0)
Platelets: 242 10*3/uL (ref 150–400)
RBC: 2.79 MIL/uL — ABNORMAL LOW (ref 4.22–5.81)
RDW: 19.5 % — ABNORMAL HIGH (ref 11.5–15.5)
WBC: 6 10*3/uL (ref 4.0–10.5)
nRBC: 0 % (ref 0.0–0.2)

## 2021-08-03 LAB — COMPREHENSIVE METABOLIC PANEL
ALT: 15 U/L (ref 0–44)
AST: 15 U/L (ref 15–41)
Albumin: 4.1 g/dL (ref 3.5–5.0)
Alkaline Phosphatase: 91 U/L (ref 38–126)
Anion gap: 8 (ref 5–15)
BUN: 33 mg/dL — ABNORMAL HIGH (ref 8–23)
CO2: 20 mmol/L — ABNORMAL LOW (ref 22–32)
Calcium: 9.3 mg/dL (ref 8.9–10.3)
Chloride: 105 mmol/L (ref 98–111)
Creatinine, Ser: 1.62 mg/dL — ABNORMAL HIGH (ref 0.61–1.24)
GFR, Estimated: 45 mL/min — ABNORMAL LOW (ref >60)
Glucose, Bld: 664 mg/dL (ref 70–99)
Potassium: 5.4 mmol/L — ABNORMAL HIGH (ref 3.5–5.1)
Sodium: 133 mmol/L — ABNORMAL LOW (ref 135–145)
Total Bilirubin: 0.6 mg/dL (ref 0.3–1.2)
Total Protein: 6.9 g/dL (ref 6.5–8.1)

## 2021-08-03 LAB — CBC WITH DIFFERENTIAL/PLATELET
Basophils Absolute: 0.1 10*3/uL (ref 0.0–0.1)
Basophils Relative: 1 % (ref 0.0–3.0)
Eosinophils Absolute: 0.2 10*3/uL (ref 0.0–0.7)
Eosinophils Relative: 3 % (ref 0.0–5.0)
HCT: 21.2 % — CL (ref 39.0–52.0)
Hemoglobin: 6.5 g/dL — CL (ref 13.0–17.0)
Lymphocytes Relative: 20.6 % (ref 12.0–46.0)
Lymphs Abs: 1.5 10*3/uL (ref 0.7–4.0)
MCHC: 30.8 g/dL (ref 30.0–36.0)
MCV: 71 fl — ABNORMAL LOW (ref 78.0–100.0)
Monocytes Absolute: 0.7 10*3/uL (ref 0.1–1.0)
Monocytes Relative: 9.8 % (ref 3.0–12.0)
Neutro Abs: 4.9 10*3/uL (ref 1.4–7.7)
Neutrophils Relative %: 65.6 % (ref 43.0–77.0)
Platelets: 269 10*3/uL (ref 150.0–400.0)
RBC: 2.98 Mil/uL — ABNORMAL LOW (ref 4.22–5.81)
RDW: 21 % — ABNORMAL HIGH (ref 11.5–15.5)
WBC: 7.4 10*3/uL (ref 4.0–10.5)

## 2021-08-03 LAB — BASIC METABOLIC PANEL
BUN: 30 mg/dL — ABNORMAL HIGH (ref 6–23)
CO2: 22 mEq/L (ref 19–32)
Calcium: 9.7 mg/dL (ref 8.4–10.5)
Chloride: 100 mEq/L (ref 96–112)
Creatinine, Ser: 1.68 mg/dL — ABNORMAL HIGH (ref 0.40–1.50)
GFR: 40.93 mL/min — ABNORMAL LOW (ref >60.00)
Glucose, Bld: 417 mg/dL — ABNORMAL HIGH (ref 70–99)
Potassium: 4.9 mEq/L (ref 3.5–5.1)
Sodium: 132 mEq/L — ABNORMAL LOW (ref 135–145)

## 2021-08-03 LAB — BRAIN NATRIURETIC PEPTIDE: Pro B Natriuretic peptide (BNP): 48 pg/mL (ref 0.0–100.0)

## 2021-08-03 LAB — PROTIME-INR
INR: 2.2 — ABNORMAL HIGH (ref 0.8–1.2)
Prothrombin Time: 24.6 seconds — ABNORMAL HIGH (ref 11.4–15.2)

## 2021-08-03 LAB — CBG MONITORING, ED: Glucose-Capillary: 347 mg/dL — ABNORMAL HIGH (ref 70–99)

## 2021-08-03 LAB — POC OCCULT BLOOD, ED: Fecal Occult Bld: POSITIVE — AB

## 2021-08-03 LAB — GLUCOSE, CAPILLARY: Glucose-Capillary: 350 mg/dL — ABNORMAL HIGH (ref 70–99)

## 2021-08-03 LAB — PREPARE RBC (CROSSMATCH)

## 2021-08-03 MED ORDER — ONDANSETRON HCL 4 MG PO TABS: 4.0000 mg | ORAL_TABLET | Freq: Four times a day (QID) | ORAL | Status: DC | PRN

## 2021-08-03 MED ORDER — INSULIN ASPART 100 UNIT/ML IJ SOLN
0.0000 [IU] | Freq: Every day | INTRAMUSCULAR | Status: DC
  Administered 2021-08-04: 3 [IU] via SUBCUTANEOUS
  Filled 2021-08-03: qty 0.05

## 2021-08-03 MED ORDER — PANTOPRAZOLE INFUSION (NEW) - SIMPLE MED
8.0000 mg/h | INTRAVENOUS | Status: DC
  Administered 2021-08-03 – 2021-08-05 (×6): 8 mg/h via INTRAVENOUS
  Filled 2021-08-03 (×4): qty 80
  Filled 2021-08-03 (×2): qty 100
  Filled 2021-08-03: qty 80
  Filled 2021-08-03: qty 100
  Filled 2021-08-03: qty 80

## 2021-08-03 MED ORDER — IPRATROPIUM-ALBUTEROL 0.5-2.5 (3) MG/3ML IN SOLN: 3.0000 mL | RESPIRATORY_TRACT | Status: DC | PRN

## 2021-08-03 MED ORDER — ACETAMINOPHEN 325 MG PO TABS: 650.0000 mg | ORAL_TABLET | Freq: Four times a day (QID) | ORAL | Status: DC | PRN

## 2021-08-03 MED ORDER — SODIUM CHLORIDE 0.9 % IV SOLN: INTRAVENOUS | Status: DC

## 2021-08-03 MED ORDER — INSULIN ASPART 100 UNIT/ML IJ SOLN
0.0000 [IU] | Freq: Three times a day (TID) | INTRAMUSCULAR | Status: DC
  Administered 2021-08-03: 11 [IU] via SUBCUTANEOUS
  Administered 2021-08-04 (×2): 5 [IU] via SUBCUTANEOUS
  Administered 2021-08-04: 8 [IU] via SUBCUTANEOUS
  Administered 2021-08-05: 3 [IU] via SUBCUTANEOUS
  Administered 2021-08-05 (×2): 5 [IU] via SUBCUTANEOUS
  Administered 2021-08-06: 8 [IU] via SUBCUTANEOUS
  Filled 2021-08-03: qty 0.15

## 2021-08-03 MED ORDER — ACETAMINOPHEN 650 MG RE SUPP: 650.0000 mg | Freq: Four times a day (QID) | RECTAL | Status: DC | PRN

## 2021-08-03 MED ORDER — SODIUM CHLORIDE 0.9 % IV SOLN: 10.0000 mL/h | Freq: Once | INTRAVENOUS | Status: DC

## 2021-08-03 MED ORDER — SENNOSIDES-DOCUSATE SODIUM 8.6-50 MG PO TABS: 1.0000 | ORAL_TABLET | Freq: Every evening | ORAL | Status: DC | PRN

## 2021-08-03 MED ORDER — INSULIN ASPART 100 UNIT/ML IJ SOLN
8.0000 [IU] | Freq: Once | INTRAMUSCULAR | Status: AC
  Administered 2021-08-03: 8 [IU] via INTRAVENOUS
  Filled 2021-08-03: qty 0.08

## 2021-08-03 MED ORDER — PANTOPRAZOLE 80MG IVPB - SIMPLE MED
80.0000 mg | Freq: Once | INTRAVENOUS | Status: AC
  Administered 2021-08-03: 80 mg via INTRAVENOUS
  Filled 2021-08-03: qty 80

## 2021-08-03 MED ORDER — ONDANSETRON HCL 4 MG/2ML IJ SOLN
4.0000 mg | Freq: Four times a day (QID) | INTRAMUSCULAR | Status: DC | PRN
  Administered 2021-08-05: 4 mg via INTRAVENOUS
  Filled 2021-08-03: qty 2

## 2021-08-03 NOTE — ED Provider Triage Note (Signed)
Emergency Medicine Provider Triage Evaluation Note ? ?Elijah Sandoval , a 71 y.o. male  was evaluated in triage.  Pt complains of shortness of breath and dizziness for several weeks. Saw his pulmonologist the other day and had lab work done. He was called today with hemoglobin of 6.2 and told to come to the ER. Had DVT about a month ago, currently on Eliquis.  ? ?Review of Systems  ?Positive: As above ?Negative: Syncope, blood in stool, GI sx ? ?Physical Exam  ?BP (!) 116/59 (BP Location: Right Arm)   Pulse 75   Temp 98 ?F (36.7 ?C) (Oral)   Resp 18   SpO2 95%  ?Gen:   Awake, no distress   ?Resp:  Normal effort  ?MSK:   Moves extremities without difficulty  ?Other:  Oxygen saturation continues to read low into the 80's, hands are cold ? ?Medical Decision Making  ?Medically screening exam initiated at 2:48 PM.  Appropriate orders placed.  Arnav Cregg was informed that the remainder of the evaluation will be completed by another provider, this initial triage assessment does not replace that evaluation, and the importance of remaining in the ED until their evaluation is complete. ? ? ?  ?Kateri Plummer, PA-C ?08/03/21 1448 ? ?

## 2021-08-03 NOTE — ED Provider Notes (Signed)
?[facility] DEPT ?Provider Note ? ? ?CSN: [id number] ?Arrival date & time: 08/03/21  1402 ? ?  ? ?History ? ?Chief Complaint  ?Patient presents with  ? Anemia  ? Weakness  ? ? ?Elijah Sandoval is a 71 y.o. male. ? ?Pt is a 71 yo male with pmhx significant for dm, htn, cva, anemia, gerd, and PE, and DVT on Eliquis.  Pt went to his pulmonologist for SOB yesterday.  She did labs which showed a hgb of 6.5.  The pt was called and told to come to the ED.  He denies any bloody stool, but has seen some black stool.  Pt feels very weak and tired.   ? ? ?  ? ?Home Medications ?Prior to Admission medications   ?Medication Sig Start Date End Date Taking? Authorizing Provider  ?amLODipine (NORVASC) 5 MG tablet Take 5 mg by mouth daily. 05/05/20   [provider]  ?apixaban (ELIQUIS) 5 MG TABS tablet Take 1 tablet (5 mg total) by mouth 2 (two) times daily. ?Patient not taking: Reported on 08/02/2021 07/10/21   Marty Heck, MD  ?APIXABAN Arne [place]) VTE STARTER PACK ('10MG'$  AND '5MG'$ ) Take as directed on package: start with two-'5mg'$  tablets twice daily for 7 days. On day 8, switch to one-'5mg'$  tablet twice daily. 06/08/21   Blanchie Dessert, MD  ?atorvastatin (LIPITOR) 40 MG tablet Take 1 tablet (40 mg total) by mouth daily at 6 PM. ?Patient taking differently: Take 40 mg by mouth daily. 01/20/12   Geradine Girt, DO  ?clopidogrel (PLAVIX) 75 MG tablet Take 1 tablet (75 mg total) by mouth daily with breakfast. 09/29/13   Philmore Pali, NP  ?famotidine (PEPCID) 20 MG tablet Take 1 tablet (20 mg total) by mouth 2 (two) times daily as needed (itching). 07/08/19   Fawze, Mina A, PA-C  ?ferrous sulfate 325 (65 FE) MG tablet Take 325 mg by mouth daily. 12/27/19   [provider]  ?glipiZIDE (GLUCOTROL) 10 MG tablet Take 10 mg by mouth 2 (two) times daily. 02/22/20   [provider]  ?guaiFENesin-dextromethorphan (ROBITUSSIN DM) 100-10 MG/5ML syrup Take 10 mLs by mouth every 4 (four) hours as  needed for cough. 05/09/20   Caren Griffins, MD  ?latanoprost (XALATAN) 0.005 % ophthalmic solution Place 1 drop into both eyes at bedtime. 04/17/20   [provider]  ?lisinopril (ZESTRIL) 40 MG tablet Take 40 mg by mouth daily. 03/15/20   [provider]  ?metoprolol succinate (TOPROL-XL) 100 MG 24 hr tablet Take 100 mg by mouth daily. 12/23/18   [provider]  ?oxyCODONE-acetaminophen (PERCOCET/ROXICET) 5-325 MG tablet Take 0.5 tablets by mouth every 6 (six) hours as needed for severe pain. 06/08/21   Blanchie Dessert, MD  ?pantoprazole (PROTONIX) 40 MG tablet Take 40 mg by mouth 2 (two) times daily. 12/18/19   [provider]  ?Tiotropium Bromide-Olodaterol (STIOLTO RESPIMAT) 2.5-2.5 MCG/ACT AERS Inhale 2 puffs into the lungs daily. 08/02/21   Parrett, Fonnie Mu, NP  ?triamcinolone cream (KENALOG) 0.1 % Apply 1 application topically 2 (two) times daily. 07/08/19   Rodell Perna A, PA-C  ?   ? ?Allergies    ?Latex   ? ?Review of Systems   ?Review of Systems  ?Constitutional:  Positive for fatigue.  ?Respiratory:  Positive for shortness of breath.   ?Neurological:  Positive for weakness.  ?All other systems reviewed and are negative. ? ?Physical Exam ?Updated Vital Signs ?BP (!) 116/59 (BP Location: Right Arm)  Pulse 75   Temp 98 ?F (36.7 ?C) (Oral)   Resp 18   SpO2 95%  ?Physical Exam ?Vitals and nursing note reviewed. Exam conducted with a chaperone present.  ?Constitutional:   ?   Appearance: Normal appearance.  ?HENT:  ?   Head: Normocephalic and atraumatic.  ?   Right Ear: External ear normal.  ?   Left Ear: External ear normal.  ?   Nose: Nose normal.  ?   Mouth/Throat:  ?   Mouth: Mucous membranes are moist.  ?   Pharynx: Oropharynx is clear.  ?Eyes:  ?   Extraocular Movements: Extraocular movements intact.  ?   Conjunctiva/sclera: Conjunctivae normal.  ?   Pupils: Pupils are equal, round, and reactive to light.  ?Cardiovascular:  ?   Rate and Rhythm: Normal rate and regular  rhythm.  ?   Pulses: Normal pulses.  ?   Heart sounds: Normal heart sounds.  ?Pulmonary:  ?   Effort: Pulmonary effort is normal.  ?   Breath sounds: Normal breath sounds.  ?Abdominal:  ?   General: Abdomen is flat. Bowel sounds are normal.  ?   Palpations: Abdomen is soft.  ?Genitourinary: ?   Rectum: Guaiac result positive.  ?Musculoskeletal:     ?   General: Normal range of motion.  ?   Cervical back: Normal range of motion and neck supple.  ?Skin: ?   General: Skin is warm.  ?   Capillary Refill: Capillary refill takes less than 2 seconds.  ?Neurological:  ?   General: No focal deficit present.  ?   Mental Status: He is alert and oriented to person, place, and time.  ?Psychiatric:     ?   Mood and Affect: Mood normal.     ?   Behavior: Behavior normal.  ? ? ?ED Results / Procedures / Treatments   ?Labs ?(all labs ordered are listed, but only abnormal results are displayed) ?Labs Reviewed  ?COMPREHENSIVE METABOLIC PANEL - Abnormal; Notable for the following components:  ?    Result Value  ? Sodium 133 (*)   ? Potassium 5.4 (*)   ? CO2 20 (*)   ? Glucose, Bld 664 (*)   ? BUN 33 (*)   ? Creatinine, Ser 1.62 (*)   ? GFR, Estimated 45 (*)   ? All other components within normal limits  ?CBC - Abnormal; Notable for the following components:  ? RBC 2.79 (*)   ? Hemoglobin 6.1 (*)   ? HCT 20.8 (*)   ? MCV 74.6 (*)   ? MCH 21.9 (*)   ? MCHC 29.3 (*)   ? RDW 19.5 (*)   ? All other components within normal limits  ?POC OCCULT BLOOD, ED - Abnormal; Notable for the following components:  ? Fecal Occult Bld POSITIVE (*)   ? All other components within normal limits  ?PROTIME-INR  ?TYPE AND SCREEN  ?PREPARE RBC (CROSSMATCH)  ? ? ?EKG ?None ? ?Radiology ?DG Chest 2 View ? ?Result Date: 08/02/2021 ?CLINICAL DATA:  Shortness of breath, dyspnea. EXAM: CHEST - 2 VIEW COMPARISON:  Chest x-ray 05/05/2020. FINDINGS: The heart size and mediastinal contours are within normal limits. Both lungs are clear. The visualized skeletal structures  are unremarkable. IMPRESSION: No active cardiopulmonary disease. Electronically Signed   By: Ronney Asters M.D.   On: 08/02/2021 17:11  ? ?DG Chest Port 1 View ? ?Result Date: 08/03/2021 ?CLINICAL DATA:  Shortness of breath. EXAM: PORTABLE CHEST 1 VIEW  COMPARISON:  August 02, 2021. FINDINGS: The heart size and mediastinal contours are within normal limits. Both lungs are clear. The visualized skeletal structures are unremarkable. IMPRESSION: No active disease. Electronically Signed   By: Marijo Conception M.D.   On: 08/03/2021 16:03   ? ?Procedures ?Procedures  ? ? ?Medications Ordered in ED ?Medications  ?pantoprazole (PROTONIX) 80 mg /NS 100 mL IVPB (80 mg Intravenous New Bag/Given 08/03/21 1605)  ?pantoprozole (PROTONIX) 80 mg /NS 100 mL infusion (8 mg/hr Intravenous New Bag/Given 08/03/21 1609)  ?0.9 %  sodium chloride infusion ( Intravenous New Bag/Given 08/03/21 1606)  ?insulin aspart (novoLOG) injection 8 Units (has no administration in time range)  ? ? ?ED Course/ Medical Decision Making/ A&P ?  ?                        ?Medical Decision Making ?Amount and/or Complexity of Data Reviewed ?Labs: ordered. ?Radiology: ordered. ? ?Risk ?Prescription drug management. ?Decision regarding hospitalization. ? ? ?This patient presents to the ED for concern of sob and anemia, this involves an extensive number of treatment options, and is a complaint that carries with it a high risk of complications and morbidity.  The differential diagnosis includes anemia from GI bleed, sob from anemia or infection ? ? ?Co morbidities that complicate the patient evaluation ? ?dm, htn, cva, anemia, gerd, and PE, and DVT on Eliquis ? ? ?Additional history obtained: ? ?Additional history obtained from epic chart review ?External records from outside source obtained and reviewed including pt's wife ? ? ?Lab Tests: ? ?I Ordered, and personally interpreted labs.  The pertinent results include:  hgb 6.1, CMP with glucose 664.  BUN of 33 and Cr of  1.62.   ? ? ?Imaging Studies ordered: ? ?I ordered imaging studies including cxr  ?I independently visualized and interpreted imaging which showed  ?  ?IMPRESSION:  ?No active disease.  ? ?I agree with the

## 2021-08-03 NOTE — Consult Note (Signed)
Referring Provider: TRH ?Primary Care Physician:  Sonia Side., FNP ?Primary Gastroenterologist:  Dr. Alessandra Bevels ? ?Reason for Consultation:  Melena, heme positive stools, anemia ? ?HPI: Elijah Sandoval is a 71 y.o. 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 Presented to the ED for shortness of breath.  ? ?Patient presents with his wife and mother today.  Patient notes for the last 2 weeks he has been increasingly fatigued and short of breath.  Starting today he noticed one large bowel movement that was black.  He denies abdominal pain, nausea, vomiting, hematemesis, constipation, diarrhea. ? ?last colonoscopy 06/16/2020 small adenomas, internal hemorrhoids, repeat: 5 years ?Reports family history of colon cancer in sister before age 42. ? ?Last EGD 03/14/2021 gastritis, biopsies negative for H. Pylori ? ?Last seen with Elliot Dally, PA-C on 03/01/2021.  At that time rectal exam was performed heme-negative. ? ?MRCP 02/22/2021 stable one-point centimeter cystic lesion on the pancreas recommend follow-up in 1 year. ? ?Patient has noted chronic anemia baseline around 9.5. ? ?Patient denies NSAID use, alcohol use, family history of colon cancer. ? ?Patient smokes 3 cigarettes daily. ? ?Patient is on Eliquis for history of DVT, PE last dose this morning.  He was previously on Plavix but was stopped 1.5 months ago. ? ? ?Past Medical History:  ?Diagnosis Date  ? Diabetes mellitus   ? Stroke [facility])   ? Pt reports TIA in 2013  ? Transfusion history   ? ? ?Past Surgical History:  ?Procedure Laterality Date  ? NO PAST SURGERIES    ? ? ?Prior to Admission medications   ?Medication Sig Start Date End Date Taking? Authorizing Provider  ?amLODipine (NORVASC) 5 MG tablet Take 5 mg by mouth daily. 05/05/20   [provider]  ?apixaban (ELIQUIS) 5 MG TABS tablet Take 1 tablet (5 mg total) by mouth 2 (two) times daily. ?Patient not taking: Reported on  08/02/2021 07/10/21   Marty Heck, MD  ?APIXABAN Arne Cleveland) VTE STARTER PACK ('10MG'$  AND '5MG'$ ) Take as directed on package: start with two-'5mg'$  tablets twice daily for 7 days. On day 8, switch to one-'5mg'$  tablet twice daily. 06/08/21   Blanchie Dessert, MD  ?atorvastatin (LIPITOR) 40 MG tablet Take 1 tablet (40 mg total) by mouth daily at 6 PM. ?Patient taking differently: Take 40 mg by mouth daily. 01/20/12   Geradine Girt, DO  ?clopidogrel (PLAVIX) 75 MG tablet Take 1 tablet (75 mg total) by mouth daily with breakfast. 09/29/13   Philmore Pali, NP  ?famotidine (PEPCID) 20 MG tablet Take 1 tablet (20 mg total) by mouth 2 (two) times daily as needed (itching). 07/08/19   Fawze, Mina A, PA-C  ?ferrous sulfate 325 (65 FE) MG tablet Take 325 mg by mouth daily. 12/27/19   [provider]  ?glipiZIDE (GLUCOTROL) 10 MG tablet Take 10 mg by mouth 2 (two) times daily. 02/22/20   [provider]  ?guaiFENesin-dextromethorphan (ROBITUSSIN DM) 100-10 MG/5ML syrup Take 10 mLs by mouth every 4 (four) hours as needed for cough. 05/09/20   Caren Griffins, MD  ?latanoprost (XALATAN) 0.005 % ophthalmic solution Place 1 drop into both eyes at bedtime. 04/17/20   [provider]  ?lisinopril (ZESTRIL) 40 MG tablet Take 40 mg by mouth daily. 03/15/20   [provider]  ?metoprolol succinate (TOPROL-XL) 100 MG 24 hr tablet Take 100 mg by mouth daily. 12/23/18   [provider]  ?oxyCODONE-acetaminophen (PERCOCET/ROXICET) 5-325 MG  tablet Take 0.5 tablets by mouth every 6 (six) hours as needed for severe pain. 06/08/21   Blanchie Dessert, MD  ?pantoprazole (PROTONIX) 40 MG tablet Take 40 mg by mouth 2 (two) times daily. 12/18/19   [provider]  ?Tiotropium Bromide-Olodaterol (STIOLTO RESPIMAT) 2.5-2.5 MCG/ACT AERS Inhale 2 puffs into the lungs daily. 08/02/21   Parrett, Fonnie Mu, NP  ?triamcinolone cream (KENALOG) 0.1 % Apply 1 application topically 2 (two) times daily. 07/08/19   Rodell Perna  A, PA-C  ? ? ?Scheduled Meds: ?Continuous Infusions: ? sodium chloride 125 mL/hr at 08/03/21 1606  ? sodium chloride    ? pantoprazole    ? pantoprazole 8 mg/hr (08/03/21 1609)  ? ?PRN Meds:. ? ?Allergies as of 08/03/2021 - Review Complete 08/03/2021  ?Allergen Reaction Noted  ? Latex  01/21/2020  ? ? ?Family History  ?Problem Relation Age of Onset  ? Cancer Sister   ? Cancer Sister   ? ? ?Social History  ? ?Socioeconomic History  ? Marital status: Married  ?  Spouse name: [name]  ? Number of children: 3  ? Years of education: HS  ? Highest education level: Not on file  ?Occupational History  ? Occupation: self employed  ?  Employer: [employer]  ?Tobacco Use  ? Smoking status: Every Day  ?  Packs/day: 0.50  ?  Years: 15.00  ?  Pack years: 7.50  ?  Types: Cigarettes  ? Smokeless tobacco: Never  ? Tobacco comments:  ?  Smoking a few a day.  08/02/2021 hfb  ?Vaping Use  ? Vaping Use: Never used  ?Substance and Sexual Activity  ? Alcohol use: No  ? Drug use: No  ? Sexual activity: Not on file  ?Other Topics Concern  ? Not on file  ?Social History Narrative  ? Patient lives at home with spouse.  ? Caffeine Use: Coffee on weekends  ? ?Social Determinants of Health  ? ?Financial Resource Strain: Not on file  ?Food Insecurity: Not on file  ?Transportation Needs: Not on file  ?Physical Activity: Not on file  ?Stress: Not on file  ?Social Connections: Not on file  ?Intimate Partner Violence: Not on file  ? ? ?Review of Systems: Review of Systems  ?Constitutional:  Negative for chills and fever.  ?HENT:  Negative for hearing loss and tinnitus.   ?Eyes:  Negative for blurred vision and double vision.  ?Respiratory:  Positive for shortness of breath. Negative for cough.   ?Cardiovascular:  Negative for chest pain and palpitations.  ?Gastrointestinal:  Positive for melena. Negative for abdominal pain, blood in stool, constipation, diarrhea, heartburn, nausea and vomiting.  ?Genitourinary:  Negative for dysuria and  urgency.  ?Musculoskeletal:  Negative for myalgias and neck pain.  ?Skin:  Negative for rash.  ?Neurological:  Negative for dizziness and headaches.  ?Endo/Heme/Allergies:  Negative for environmental allergies. Does not bruise/bleed easily.  ?Psychiatric/Behavioral:  Negative for depression and suicidal ideas.   ? ? ?Physical Exam:Physical Exam ?Constitutional:   ?   Appearance: Normal appearance. He is normal weight.  ?HENT:  ?   Head: Normocephalic and atraumatic.  ?   Right Ear: External ear normal.  ?   Left Ear: External ear normal.  ?   Nose: Nose normal.  ?   Mouth/Throat:  ?   Mouth: Mucous membranes are moist.  ?Eyes:  ?   Conjunctiva/sclera: Conjunctivae normal.  ?   Pupils: Pupils are equal, round, and reactive to light.  ?  Comments: Conjunctival pallor  ?Cardiovascular:  ?   Rate and Rhythm: Normal rate and regular rhythm.  ?Pulmonary:  ?   Effort: Pulmonary effort is normal.  ?   Breath sounds: Wheezing (left lower lobe) present.  ?Abdominal:  ?   General: Abdomen is flat. Bowel sounds are normal. There is no distension.  ?   Palpations: Abdomen is soft. There is no mass.  ?   Tenderness: There is no abdominal tenderness. There is no guarding or rebound.  ?   Hernia: No hernia is present.  ?Musculoskeletal:     ?   General: Normal range of motion.  ?   Cervical back: Normal range of motion.  ?Skin: ?   General: Skin is warm and dry.  ?   Coloration: Skin is pale.  ?Neurological:  ?   General: No focal deficit present.  ?   Mental Status: He is alert and oriented to person, place, and time. Mental status is at baseline.  ?Psychiatric:     ?   Mood and Affect: Mood normal.     ?   Behavior: Behavior normal.  ?  ?Vital signs: ?Vitals:  ? 08/03/21 1412  ?BP: (!) 116/59  ?Pulse: 75  ?Resp: 18  ?Temp: 98 ?F (36.7 ?C)  ?SpO2: 95%  ? ?  ? ? ? ?GI:  ?Lab Results: ?Recent Labs  ?  08/02/21 ?4098 08/03/21 ?1191  ?WBC 7.4 6.0  ?HGB 6.5 Repeated and verified X2.* 6.1*  ?HCT 21.2 Repeated and verified X2.* 20.8*   ?PLT 269.0 242  ? ?BMET ?Recent Labs  ?  08/02/21 ?1653  ?NA 132*  ?K 4.9  ?CL 100  ?CO2 22  ?GLUCOSE 417*  ?BUN 30*  ?CREATININE 1.68*  ?CALCIUM 9.7  ? ?LFT ?No results for input(s): PROT, ALBUMIN, AST, ALT, ALKP

## 2021-08-03 NOTE — ED Notes (Addendum)
Blood bank has 2 units of blood ready for this patient. Notified Brianna,RN. ?

## 2021-08-03 NOTE — ED Triage Notes (Signed)
Pt arriving after being called by his Dr. Abbott Pao was informed that his Hgb was 6.2 from blood work done at the office yesterday. Pt reports increased fatigue/weakness and shortness of breath.  ?

## 2021-08-03 NOTE — Telephone Encounter (Signed)
Spoke to [place], Therapist, sports with [facility] and notified her of below message.  ?Nothing further needed.  ? ?

## 2021-08-03 NOTE — Telephone Encounter (Signed)
6.5 hemoglobin 21.2 hematocrit ? ?Patient with symptomatic anemia possible GI bleed on Eliquis. ?Needs to be admitted and most likely blood transfusion.  Called patient advised of labs and need to go to the emergency room.  Patient says his wife will take him to [facility] long emergency room that is where he was admitted at previously.  Patient has a history of symptomatic anemia.  Please see office note from yesterday.  We will call emergency room at [facility] long to notify patient's arrival. ?

## 2021-08-03 NOTE — H&P (Signed)
?History and Physical  ? ? ?Elijah Sandoval AUQ:[id number] DOB: [birth year] DOA: 08/03/2021 ? ?PCP: Sonia Side., FNP  ? ?Patient coming from: Home ? ?I have personally briefly reviewed patient's old medical records in [place] ? ?Chief Complaint: Anemia and weakness ? ?HPI: Elijah Sandoval is a 71 y.o. male with medical history significant of diabetes mellitus type 2, hypertension, PE and DVT on Eliquis, COPD/emphysema, tobacco abuse, anemia of chronic disease, hyperlipidemia who was referred to the ED by his pulmonary office because of low hemoglobin.  Patient had seen his pulmonary provider on 08/02/2021 for shortness of breath.  Labs were done which had shown hemoglobin of 6.5.  Patient was asked to come to the ED.  Patient denies any bloody stools, nausea, vomiting, abdominal pain but states that he has seen some black stool.  Patient feels very weak and tired.  No loss of consciousness, syncope, seizures, chest pain, fever, worsening cough. ? ?ED Course: Hemoglobin was 6.1 with positive fecal occult blood.  Glucose of 664 without elevated anion gap and creatinine of 1.62.  GI was consulted.  He was started on IV Protonix and packed red cells transfusion was ordered. ?Hospitalist service was called to evaluate the patient. ? ?Review of Systems: As per HPI otherwise all other systems were reviewed and are negative. ? ? ?Past Medical History:  ?Diagnosis Date  ? Diabetes mellitus   ? Stroke [facility])   ? Pt reports TIA in 2013  ? Transfusion history   ? ? ?Past Surgical History:  ?Procedure Laterality Date  ? NO PAST SURGERIES    ? ?Social history ? reports that he has been smoking cigarettes. He has a 7.50 pack-year smoking history. He has never used smokeless tobacco. He reports that he does not drink alcohol and does not use drugs. ? ?Allergies  ?Allergen Reactions  ? Latex   ?  Other reaction(s): Unknown  ? ? ?Family History  ?Problem Relation Age of Onset  ? Cancer Sister   ? Cancer Sister   ? ? ?Prior to  Admission medications   ?Medication Sig Start Date End Date Taking? Authorizing Provider  ?amLODipine (NORVASC) 5 MG tablet Take 5 mg by mouth daily. 05/05/20   [provider]  ?apixaban (ELIQUIS) 5 MG TABS tablet Take 1 tablet (5 mg total) by mouth 2 (two) times daily. ?Patient not taking: Reported on 08/02/2021 07/10/21   Marty Heck, MD  ?APIXABAN Arne [place]) VTE STARTER PACK ('10MG'$  AND '5MG'$ ) Take as directed on package: start with two-'5mg'$  tablets twice daily for 7 days. On day 8, switch to one-'5mg'$  tablet twice daily. 06/08/21   Blanchie Dessert, MD  ?atorvastatin (LIPITOR) 40 MG tablet Take 1 tablet (40 mg total) by mouth daily at 6 PM. ?Patient taking differently: Take 40 mg by mouth daily. 01/20/12   Geradine Girt, DO  ?clopidogrel (PLAVIX) 75 MG tablet Take 1 tablet (75 mg total) by mouth daily with breakfast. 09/29/13   Philmore Pali, NP  ?famotidine (PEPCID) 20 MG tablet Take 1 tablet (20 mg total) by mouth 2 (two) times daily as needed (itching). 07/08/19   Fawze, Mina A, PA-C  ?ferrous sulfate 325 (65 FE) MG tablet Take 325 mg by mouth daily. 12/27/19   [provider]  ?glipiZIDE (GLUCOTROL) 10 MG tablet Take 10 mg by mouth 2 (two) times daily. 02/22/20   [provider]  ?guaiFENesin-dextromethorphan (ROBITUSSIN DM) 100-10 MG/5ML syrup Take 10 mLs by mouth every 4 (four) hours as needed  for cough. 05/09/20   Caren Griffins, MD  ?latanoprost (XALATAN) 0.005 % ophthalmic solution Place 1 drop into both eyes at bedtime. 04/17/20   [provider]  ?lisinopril (ZESTRIL) 40 MG tablet Take 40 mg by mouth daily. 03/15/20   [provider]  ?metoprolol succinate (TOPROL-XL) 100 MG 24 hr tablet Take 100 mg by mouth daily. 12/23/18   [provider]  ?oxyCODONE-acetaminophen (PERCOCET/ROXICET) 5-325 MG tablet Take 0.5 tablets by mouth every 6 (six) hours as needed for severe pain. 06/08/21   Blanchie Dessert, MD  ?pantoprazole (PROTONIX) 40 MG tablet Take 40 mg by  mouth 2 (two) times daily. 12/18/19   [provider]  ?Tiotropium Bromide-Olodaterol (STIOLTO RESPIMAT) 2.5-2.5 MCG/ACT AERS Inhale 2 puffs into the lungs daily. 08/02/21   Parrett, Fonnie Mu, NP  ?triamcinolone cream (KENALOG) 0.1 % Apply 1 application topically 2 (two) times daily. 07/08/19   Rodell Perna A, PA-C  ? ? ?Physical Exam: ?Vitals:  ? 08/03/21 1412  ?BP: (!) 116/59  ?Pulse: 75  ?Resp: 18  ?Temp: 98 ?F (36.7 ?C)  ?TempSrc: Oral  ?SpO2: 95%  ? ? ?Constitutional: NAD, calm, comfortable.  Looks chronically ill and deconditioned.  Currently on room air. ?Vitals:  ? 08/03/21 1412  ?BP: (!) 116/59  ?Pulse: 75  ?Resp: 18  ?Temp: 98 ?F (36.7 ?C)  ?TempSrc: Oral  ?SpO2: 95%  ? ?Eyes: PERRL, pallor present.   ?ENMT: Mucous membranes are moist. Posterior pharynx clear of any exudate or lesions. ?Neck: normal, supple, no masses, no thyromegaly ?Respiratory: bilateral decreased breath sounds at bases, no wheezing, some scattered crackles present.  Normal respiratory effort. No accessory muscle use.  ?Cardiovascular: S1 S2 positive, rate controlled. No extremity edema. 2+ pedal pulses.  ?Abdomen: no tenderness, no masses palpated. No hepatosplenomegaly. Bowel sounds positive.  ?Musculoskeletal: no clubbing / cyanosis. No joint deformity upper and lower extremities.  ?Skin: no rashes, lesions, ulcers. No induration ?Neurologic: CN 2-12 grossly intact. Moving extremities. No focal neurologic deficits.  ?Psychiatric: Normal judgment and insight. Alert and oriented x 3. Normal mood.  ? ? ?Labs on Admission: I have personally reviewed following labs and imaging studies ? ?CBC: ?Recent Labs  ?Lab 08/02/21 ?1653 08/03/21 ?1454  ?WBC 7.4 6.0  ?NEUTROABS 4.9  --   ?HGB 6.5 Repeated and verified X2.* 6.1*  ?HCT 21.2 Repeated and verified X2.* 20.8*  ?MCV 71.0* 74.6*  ?PLT 269.0 242  ? ?Basic Metabolic Panel: ?Recent Labs  ?Lab 08/02/21 ?1653 08/03/21 ?1454  ?NA 132* 133*  ?K 4.9 5.4*  ?CL 100 105  ?CO2 22 20*  ?GLUCOSE 417*  664*  ?BUN 30* 33*  ?CREATININE 1.68* 1.62*  ?CALCIUM 9.7 9.3  ? ?GFR: ?Estimated Creatinine Clearance: 47.4 mL/min (A) (by C-G formula based on SCr of 1.62 mg/dL (H)). ?Liver Function Tests: ?Recent Labs  ?Lab 08/03/21 ?1454  ?AST 15  ?ALT 15  ?ALKPHOS 91  ?BILITOT 0.6  ?PROT 6.9  ?ALBUMIN 4.1  ? ?No results for input(s): LIPASE, AMYLASE in the last 168 hours. ?No results for input(s): AMMONIA in the last 168 hours. ?Coagulation Profile: ?No results for input(s): INR, PROTIME in the last 168 hours. ?Cardiac Enzymes: ?No results for input(s): CKTOTAL, CKMB, CKMBINDEX, TROPONINI in the last 168 hours. ?BNP (last 3 results) ?Recent Labs  ?  08/02/21 ?1653  ?PROBNP 48.0  ? ?HbA1C: ?No results for input(s): HGBA1C in the last 72 hours. ?CBG: ?No results for input(s): GLUCAP in the last 168 hours. ?Lipid Profile: ?No results  for input(s): CHOL, HDL, LDLCALC, TRIG, CHOLHDL, LDLDIRECT in the last 72 hours. ?Thyroid Function Tests: ?No results for input(s): TSH, T4TOTAL, FREET4, T3FREE, THYROIDAB in the last 72 hours. ?Anemia Panel: ?No results for input(s): VITAMINB12, FOLATE, FERRITIN, TIBC, IRON, RETICCTPCT in the last 72 hours. ?Urine analysis: ?   ?Component Value Date/Time  ? COLORURINE YELLOW 05/06/2020 0828  ? APPEARANCEUR CLEAR 05/06/2020 0828  ? LABSPEC 1.017 05/06/2020 0828  ? PHURINE 5.0 05/06/2020 0828  ? GLUCOSEU >=500 (A) 05/06/2020 5631  ? [place] NEGATIVE 05/06/2020 0828  ? [place] NEGATIVE 05/06/2020 0828  ? [place] NEGATIVE 05/06/2020 0828  ? [place] NEGATIVE 05/06/2020 0828  ? NITRITE NEGATIVE 05/06/2020 0828  ? LEUKOCYTESUR NEGATIVE 05/06/2020 0828  ? ? ?Radiological Exams on Admission: ?DG Chest 2 View ? ?Result Date: 08/02/2021 ?CLINICAL DATA:  Shortness of breath, dyspnea. EXAM: CHEST - 2 VIEW COMPARISON:  Chest x-ray 05/05/2020. FINDINGS: The heart size and mediastinal contours are within normal limits. Both lungs are clear. The visualized skeletal structures are unremarkable. IMPRESSION: No  active cardiopulmonary disease. Electronically Signed   By: Ronney Asters M.D.   On: 08/02/2021 17:11  ? ?DG Chest Port 1 View ? ?Result Date: 08/03/2021 ?CLINICAL DATA:  Shortness of breath. EXAM: PORTABLE

## 2021-08-04 DIAGNOSIS — N179 Acute kidney failure, unspecified: Secondary | ICD-10-CM | POA: Diagnosis present

## 2021-08-04 DIAGNOSIS — E785 Hyperlipidemia, unspecified: Secondary | ICD-10-CM | POA: Diagnosis present

## 2021-08-04 DIAGNOSIS — D649 Anemia, unspecified: Secondary | ICD-10-CM | POA: Diagnosis not present

## 2021-08-04 DIAGNOSIS — K922 Gastrointestinal hemorrhage, unspecified: Secondary | ICD-10-CM | POA: Diagnosis present

## 2021-08-04 DIAGNOSIS — K31819 Angiodysplasia of stomach and duodenum without bleeding: Secondary | ICD-10-CM | POA: Diagnosis not present

## 2021-08-04 DIAGNOSIS — J449 Chronic obstructive pulmonary disease, unspecified: Secondary | ICD-10-CM | POA: Diagnosis not present

## 2021-08-04 DIAGNOSIS — J432 Centrilobular emphysema: Secondary | ICD-10-CM | POA: Diagnosis present

## 2021-08-04 DIAGNOSIS — E871 Hypo-osmolality and hyponatremia: Secondary | ICD-10-CM | POA: Diagnosis present

## 2021-08-04 DIAGNOSIS — E875 Hyperkalemia: Secondary | ICD-10-CM | POA: Diagnosis present

## 2021-08-04 DIAGNOSIS — Z7984 Long term (current) use of oral hypoglycemic drugs: Secondary | ICD-10-CM | POA: Diagnosis not present

## 2021-08-04 DIAGNOSIS — R791 Abnormal coagulation profile: Secondary | ICD-10-CM | POA: Diagnosis present

## 2021-08-04 DIAGNOSIS — Z86711 Personal history of pulmonary embolism: Secondary | ICD-10-CM | POA: Diagnosis not present

## 2021-08-04 DIAGNOSIS — I10 Essential (primary) hypertension: Secondary | ICD-10-CM | POA: Diagnosis present

## 2021-08-04 DIAGNOSIS — Z86718 Personal history of other venous thrombosis and embolism: Secondary | ICD-10-CM | POA: Diagnosis not present

## 2021-08-04 DIAGNOSIS — D638 Anemia in other chronic diseases classified elsewhere: Secondary | ICD-10-CM | POA: Diagnosis present

## 2021-08-04 DIAGNOSIS — K31811 Angiodysplasia of stomach and duodenum with bleeding: Secondary | ICD-10-CM | POA: Diagnosis present

## 2021-08-04 DIAGNOSIS — E1165 Type 2 diabetes mellitus with hyperglycemia: Secondary | ICD-10-CM | POA: Diagnosis present

## 2021-08-04 DIAGNOSIS — K219 Gastro-esophageal reflux disease without esophagitis: Secondary | ICD-10-CM | POA: Diagnosis present

## 2021-08-04 DIAGNOSIS — Z79899 Other long term (current) drug therapy: Secondary | ICD-10-CM | POA: Diagnosis not present

## 2021-08-04 DIAGNOSIS — Z7901 Long term (current) use of anticoagulants: Secondary | ICD-10-CM | POA: Diagnosis not present

## 2021-08-04 DIAGNOSIS — Z91040 Latex allergy status: Secondary | ICD-10-CM | POA: Diagnosis not present

## 2021-08-04 DIAGNOSIS — K254 Chronic or unspecified gastric ulcer with hemorrhage: Secondary | ICD-10-CM | POA: Diagnosis present

## 2021-08-04 DIAGNOSIS — D500 Iron deficiency anemia secondary to blood loss (chronic): Secondary | ICD-10-CM | POA: Diagnosis present

## 2021-08-04 DIAGNOSIS — Z800 Family history of malignant neoplasm of digestive organs: Secondary | ICD-10-CM | POA: Diagnosis not present

## 2021-08-04 DIAGNOSIS — Z8673 Personal history of transient ischemic attack (TIA), and cerebral infarction without residual deficits: Secondary | ICD-10-CM | POA: Diagnosis not present

## 2021-08-04 DIAGNOSIS — F17210 Nicotine dependence, cigarettes, uncomplicated: Secondary | ICD-10-CM | POA: Diagnosis present

## 2021-08-04 DIAGNOSIS — K259 Gastric ulcer, unspecified as acute or chronic, without hemorrhage or perforation: Secondary | ICD-10-CM | POA: Diagnosis not present

## 2021-08-04 LAB — HEMOGLOBIN A1C
Hgb A1c MFr Bld: 8.6 % — ABNORMAL HIGH (ref 4.8–5.6)
Mean Plasma Glucose: 200.12 mg/dL

## 2021-08-04 LAB — CBC
HCT: 28.5 % — ABNORMAL LOW (ref 39.0–52.0)
Hemoglobin: 9 g/dL — ABNORMAL LOW (ref 13.0–17.0)
MCH: 24.5 pg — ABNORMAL LOW (ref 26.0–34.0)
MCHC: 31.6 g/dL (ref 30.0–36.0)
MCV: 77.7 fL — ABNORMAL LOW (ref 80.0–100.0)
Platelets: 228 10*3/uL (ref 150–400)
RBC: 3.67 MIL/uL — ABNORMAL LOW (ref 4.22–5.81)
RDW: 19.6 % — ABNORMAL HIGH (ref 11.5–15.5)
WBC: 6.5 10*3/uL (ref 4.0–10.5)
nRBC: 0 % (ref 0.0–0.2)

## 2021-08-04 LAB — BPAM RBC
Blood Product Expiration Date: 202305212359
Blood Product Expiration Date: 202305212359
ISSUE DATE / TIME: 202304211834
ISSUE DATE / TIME: 202304212311
Unit Type and Rh: 9500
Unit Type and Rh: 9500

## 2021-08-04 LAB — COMPREHENSIVE METABOLIC PANEL
ALT: 13 U/L (ref 0–44)
AST: 15 U/L (ref 15–41)
Albumin: 4.2 g/dL (ref 3.5–5.0)
Alkaline Phosphatase: 69 U/L (ref 38–126)
Anion gap: 7 (ref 5–15)
BUN: 22 mg/dL (ref 8–23)
CO2: 22 mmol/L (ref 22–32)
Calcium: 9.1 mg/dL (ref 8.9–10.3)
Chloride: 109 mmol/L (ref 98–111)
Creatinine, Ser: 1.13 mg/dL (ref 0.61–1.24)
GFR, Estimated: >60 mL/min (ref >60)
Glucose, Bld: 232 mg/dL — ABNORMAL HIGH (ref 70–99)
Potassium: 4 mmol/L (ref 3.5–5.1)
Sodium: 138 mmol/L (ref 135–145)
Total Bilirubin: 1.6 mg/dL — ABNORMAL HIGH (ref 0.3–1.2)
Total Protein: 6.6 g/dL (ref 6.5–8.1)

## 2021-08-04 LAB — GLUCOSE, CAPILLARY
Glucose-Capillary: 192 mg/dL — ABNORMAL HIGH (ref 70–99)
Glucose-Capillary: 204 mg/dL — ABNORMAL HIGH (ref 70–99)
Glucose-Capillary: 219 mg/dL — ABNORMAL HIGH (ref 70–99)
Glucose-Capillary: 263 mg/dL — ABNORMAL HIGH (ref 70–99)
Glucose-Capillary: 293 mg/dL — ABNORMAL HIGH (ref 70–99)

## 2021-08-04 LAB — TYPE AND SCREEN
ABO/RH(D): O NEG
Antibody Screen: NEGATIVE
Unit division: 0
Unit division: 0

## 2021-08-04 LAB — MAGNESIUM: Magnesium: 2.1 mg/dL (ref 1.7–2.4)

## 2021-08-04 NOTE — TOC Initial Note (Signed)
Transition of Care (TOC) - Initial/Assessment Note  ? ? ?Patient Details  ?Name: Elijah Sandoval ?MRN: [id number] ?Date of Birth: 06/01/1950 ? ?Transition of Care (TOC) CM/SW Contact:    ?Tawanna Cooler, RN ?Phone Number: ?08/04/2021, 5:15 PM ? ?Clinical Narrative:                 ? ? ? ?Transition of Care Department [facility]) has reviewed patient and no TOC needs have been identified at this time. We will continue to monitor patient advancement through interdisciplinary progression rounds. If new patient transition needs arise, please place a TOC consult. ? ? ? ?Expected Discharge Plan: Home/Self Care ?Barriers to Discharge: Continued Medical Work up ? ? ?Expected Discharge Plan and Services ?Expected Discharge Plan: Home/Self Care ?  ?  ?  ?Living arrangements for the past 2 months: [place] ?                ? ?Prior Living Arrangements/Services ?Living arrangements for the past 2 months: [place] ?Lives with:: Spouse ?Patient language and need for interpreter reviewed:: Yes ?       ?Need for Family Participation in Patient Care: Yes (Comment) ?Care giver support system in place?: Yes (comment) ?  ?Criminal Activity/Legal Involvement Pertinent to Current Situation/Hospitalization: No - Comment as needed ? ?Activities of Daily Living ?Home Assistive Devices/Equipment: None ?ADL Screening (condition at time of admission) ?Patient's cognitive ability adequate to safely complete daily activities?: Yes ?Is the patient deaf or have difficulty hearing?: No ?Does the patient have difficulty seeing, even when wearing glasses/contacts?: No ?Does the patient have difficulty concentrating, remembering, or making decisions?: No ?Patient able to express need for assistance with ADLs?: Yes ?Does the patient have difficulty dressing or bathing?: No ?Independently performs ADLs?: Yes (appropriate for developmental age) ?Does the patient have difficulty walking or climbing stairs?: No ?Weakness of Legs: None ?Weakness  of Arms/Hands: None ? ?Emotional Assessment ?  ?Orientation: : Oriented to Self, Oriented to Place, Oriented to  Time, Oriented to Situation ?Alcohol / Substance Use: Not Applicable ?Psych Involvement: No (comment) ? ?Admission diagnosis:  GI bleed [K92.2] ?Upper GI bleed [K92.2] ?Symptomatic anemia [D64.9] ?Hyperglycemia due to diabetes mellitus ([place]) [E11.65] ?Patient Active Problem List  ? Diagnosis Date Noted  ? GI bleed 08/03/2021  ? Hyponatremia 08/03/2021  ? Hyperkalemia 08/03/2021  ? AKI (acute kidney injury) ([place]) 08/03/2021  ? Uncontrolled type 2 diabetes mellitus with hyperglycemia, without long-term current use of insulin ([place]) 08/03/2021  ? Pulmonary embolism ([place]) 08/02/2021  ? Dyspnea 08/02/2021  ? Centrilobular emphysema ([place]) 08/02/2021  ? Deep venous thrombosis ([place]) 07/10/2021  ? COVID-19 05/05/2020  ? Symptomatic anemia 02/25/2019  ? Essential hypertension 02/25/2019  ? Stroke [facility]) 02/25/2019  ? HLD (hyperlipidemia) 01/20/2012  ? Tobacco abuse 01/20/2012  ? Cerebral infarction ([place]) 01/19/2012  ? ?PCP:  Sonia Side., FNP ?Pharmacy:   ?[id number]?Phone: [phone]: [phone]? ?

## 2021-08-04 NOTE — H&P (View-Only) (Signed)
Subjective: ?Patient states he has not had a bowel movement since yesterday. ?Tolerating clear liquids without associated nausea, vomiting or abdominal pain. ?He reports improvement in his breathing. ? ?Objective: ?Vital signs in last 24 hours: ?Temp:  [97.7 ?F (36.5 ?C)-98.7 ?F (37.1 ?C)] 98.1 ?F (36.7 ?C) (04/22 0520) ?Pulse Rate:  [61-77] 61 (04/22 0520) ?Resp:  [16-20] 16 (04/22 0520) ?BP: (104-132)/(53-72) 110/53 (04/22 0520) ?SpO2:  [95 %-100 %] 98 % (04/22 0520) ?Weight:  [81 kg-83 kg] 81 kg (04/22 0500) ?Weight change:  ?Last BM Date : 08/03/21 (+OB yesterday) ? ?PE: Not in distress, able to speak in full sentences ?GENERAL: Mild pallor  ?ABDOMEN: Distended but nontender, normoactive bowel sounds ?EXTREMITIES: No deformity ? ?Lab Results: ?Results for orders placed or performed during the hospital encounter of 08/03/21 (from the past 48 hour(s))  ?Comprehensive metabolic panel     Status: Abnormal  ? Collection Time: 08/03/21  2:54 PM  ?Result Value Ref Range  ? Sodium 133 (L) 135 - 145 mmol/L  ? Potassium 5.4 (H) 3.5 - 5.1 mmol/L  ? Chloride 105 98 - 111 mmol/L  ? CO2 20 (L) 22 - 32 mmol/L  ? Glucose, Bld 664 (HH) 70 - 99 mg/dL  ?  Comment: Glucose reference range applies only to samples taken after fasting for at least 8 hours. ?CRITICAL RESULT CALLED TO, READ BACK BY AND VERIFIED WITH: ?S.LEONARD, RN AT 1616 ON 04.21.23 BY N.THOMPSON ?  ? BUN 33 (H) 8 - 23 mg/dL  ? Creatinine, Ser 1.62 (H) 0.61 - 1.24 mg/dL  ? Calcium 9.3 8.9 - 10.3 mg/dL  ? Total Protein 6.9 6.5 - 8.1 g/dL  ? Albumin 4.1 3.5 - 5.0 g/dL  ? AST 15 15 - 41 U/L  ? ALT 15 0 - 44 U/L  ? Alkaline Phosphatase 91 38 - 126 U/L  ? Total Bilirubin 0.6 0.3 - 1.2 mg/dL  ? GFR, Estimated 45 (L) >60 mL/min  ?  Comment: (NOTE) ?Calculated using the CKD-EPI Creatinine Equation (2021) ?  ? Anion gap 8 5 - 15  ?  Comment: Performed at [facility], [place] [address], [place], [place] [address]  ?CBC     Status: Abnormal  ? Collection Time:  08/03/21  2:54 PM  ?Result Value Ref Range  ? WBC 6.0 4.0 - 10.5 K/uL  ? RBC 2.79 (L) 4.22 - 5.81 MIL/uL  ? Hemoglobin 6.1 (LL) 13.0 - 17.0 g/dL  ?  Comment: REPEATED TO VERIFY ?Reticulocyte Hemoglobin testing ?may be clinically indicated, ?consider ordering this additional ?test [id number] ?THIS CRITICAL RESULT HAS VERIFIED AND BEEN CALLED TO RN W FRANKLIN BY ALEXIS [place] ON 04 21 2023 AT 1539, AND HAS BEEN READ BACK.  ?  ? HCT 20.8 (L) 39.0 - 52.0 %  ? MCV 74.6 (L) 80.0 - 100.0 fL  ? MCH 21.9 (L) 26.0 - 34.0 pg  ? MCHC 29.3 (L) 30.0 - 36.0 g/dL  ? RDW 19.5 (H) 11.5 - 15.5 %  ? Platelets 242 150 - 400 K/uL  ? nRBC 0.0 0.0 - 0.2 %  ?  Comment: Performed at [facility], [place] [address], [place], [place] [address]  ?Type and screen [place]     Status: None (Preliminary result)  ? Collection Time: 08/03/21  2:54 PM  ?Result Value Ref Range  ? ABO/RH(D) O NEG   ? Antibody Screen NEG   ? Sample Expiration 08/06/2021,2359   ? Unit Number [id number]   ?  Blood Component Type RED CELLS,LR   ? Unit division 00   ? Status of Unit ISSUED   ? Transfusion Status OK TO TRANSFUSE   ? Crossmatch Result    ?  Compatible ?Performed at [facility], [place] [address], [place], [place] [address] ?  ? Unit Number [id number]   ? Blood Component Type RED CELLS,LR   ? Unit division 00   ? Status of Unit ISSUED   ? Transfusion Status OK TO TRANSFUSE   ? Crossmatch Result Compatible   ?POC occult blood, ED     Status: Abnormal  ? Collection Time: 08/03/21  3:26 PM  ?Result Value Ref Range  ? Fecal Occult Bld POSITIVE (A) NEGATIVE  ?Protime-INR     Status: Abnormal  ? Collection Time: 08/03/21  3:29 PM  ?Result Value Ref Range  ? Prothrombin Time 24.6 (H) 11.4 - 15.2 seconds  ? INR 2.2 (H) 0.8 - 1.2  ?  Comment: (NOTE) ?INR goal varies based on device and disease states. ?Performed at [facility], [place] [address] ?[place], [place] [address] ?  ?Prepare RBC  (crossmatch)     Status: None  ? Collection Time: 08/03/21  4:30 PM  ?Result Value Ref Range  ? Order Confirmation    ?  ORDER PROCESSED BY BLOOD BANK ?Performed at [facility], [place] [address], [place], [place] [address] ?  ?CBG monitoring, ED     Status: Abnormal  ? Collection Time: 08/03/21  5:10 PM  ?Result Value Ref Range  ? Glucose-Capillary 347 (H) 70 - 99 mg/dL  ?  Comment: Glucose reference range applies only to samples taken after fasting for at least 8 hours.  ?Glucose, capillary     Status: Abnormal  ? Collection Time: 08/03/21  6:03 PM  ?Result Value Ref Range  ? Glucose-Capillary 350 (H) 70 - 99 mg/dL  ?  Comment: Glucose reference range applies only to samples taken after fasting for at least 8 hours.  ?Glucose, capillary     Status: Abnormal  ? Collection Time: 08/04/21 12:01 AM  ?Result Value Ref Range  ? Glucose-Capillary 192 (H) 70 - 99 mg/dL  ?  Comment: Glucose reference range applies only to samples taken after fasting for at least 8 hours.  ?Hemoglobin A1c     Status: Abnormal  ? Collection Time: 08/04/21  6:35 AM  ?Result Value Ref Range  ? Hgb A1c MFr Bld 8.6 (H) 4.8 - 5.6 %  ?  Comment: (NOTE) ?Pre diabetes:          5.7%-6.4% ? ?Diabetes:              >6.4% ? ?Glycemic control for   <7.0% ?adults with diabetes ?  ? Mean Plasma Glucose 200.12 mg/dL  ?  Comment: Performed at [facility], [place] [address] [place], [place] [address]  ?CBC     Status: Abnormal  ? Collection Time: 08/04/21  6:35 AM  ?Result Value Ref Range  ? WBC 6.5 4.0 - 10.5 K/uL  ? RBC 3.67 (L) 4.22 - 5.81 MIL/uL  ? Hemoglobin 9.0 (L) 13.0 - 17.0 g/dL  ?  Comment: REPEATED TO VERIFY ?POST TRANSFUSION SPECIMEN ?  ? HCT 28.5 (L) 39.0 - 52.0 %  ? MCV 77.7 (L) 80.0 - 100.0 fL  ? MCH 24.5 (L) 26.0 - 34.0 pg  ? MCHC 31.6 30.0 - 36.0 g/dL  ? RDW 19.6 (H) 11.5 - 15.5 %  ? Platelets  228 150 - 400 K/uL  ? nRBC 0.0 0.0 - 0.2 %  ?  Comment: Performed at [facility], [place] [address],  [place], [place] [address]  ?Comprehensive metabolic panel     Status: Abnormal  ? Collection Time: 08/04/21  6:35 AM  ?Result Value Ref Range  ? Sodium 138 135 - 145 mmol/L  ? Potassium 4.0 3.5 - 5.1 mmol/L  ?  Comment: DELTA CHECK NOTED  ? Chloride 109 98 - 111 mmol/L  ? CO2 22 22 - 32 mmol/L  ? Glucose, Bld 232 (H) 70 - 99 mg/dL  ?  Comment: Glucose reference range applies only to samples taken after fasting for at least 8 hours.  ? BUN 22 8 - 23 mg/dL  ? Creatinine, Ser 1.13 0.61 - 1.24 mg/dL  ? Calcium 9.1 8.9 - 10.3 mg/dL  ? Total Protein 6.6 6.5 - 8.1 g/dL  ? Albumin 4.2 3.5 - 5.0 g/dL  ? AST 15 15 - 41 U/L  ? ALT 13 0 - 44 U/L  ? Alkaline Phosphatase 69 38 - 126 U/L  ? Total Bilirubin 1.6 (H) 0.3 - 1.2 mg/dL  ? GFR, Estimated >60 >60 mL/min  ?  Comment: (NOTE) ?Calculated using the CKD-EPI Creatinine Equation (2021) ?  ? Anion gap 7 5 - 15  ?  Comment: Performed at [facility], [place] [address], [place], [place] [address]  ?Magnesium     Status: None  ? Collection Time: 08/04/21  6:35 AM  ?Result Value Ref Range  ? Magnesium 2.1 1.7 - 2.4 mg/dL  ?  Comment: Performed at [facility], [place] [address], [place], [place] [address]  ?Glucose, capillary     Status: Abnormal  ? Collection Time: 08/04/21  7:57 AM  ?Result Value Ref Range  ? Glucose-Capillary 219 (H) 70 - 99 mg/dL  ?  Comment: Glucose reference range applies only to samples taken after fasting for at least 8 hours.  ? ? ?Studies/Results: ?DG Chest 2 View ? ?Result Date: 08/02/2021 ?CLINICAL DATA:  Shortness of breath, dyspnea. EXAM: CHEST - 2 VIEW COMPARISON:  Chest x-ray 05/05/2020. FINDINGS: The heart size and mediastinal contours are within normal limits. Both lungs are clear. The visualized skeletal structures are unremarkable. IMPRESSION: No active cardiopulmonary disease. Electronically Signed   By: Ronney Asters M.D.   On: 08/02/2021 17:11  ? ?DG Chest Port 1 View ? ?Result Date: 08/03/2021 ?CLINICAL DATA:   Shortness of breath. EXAM: PORTABLE CHEST 1 VIEW COMPARISON:  August 02, 2021. FINDINGS: The heart size and mediastinal contours are within normal limits. Both lungs are clear. The visualized skeletal structures ar

## 2021-08-04 NOTE — Progress Notes (Addendum)
?PROGRESS NOTE ? ? ? ?Elijah Sandoval  MBW:[id number] DOB: 05/06/1950 DOA: 08/03/2021 ?PCP: Sonia Side., FNP  ? ?Brief Narrative:  ?71 y.o. male with medical history significant of diabetes mellitus type 2, hypertension, PE and DVT on Eliquis, COPD/emphysema, tobacco abuse, anemia of chronic disease, hyperlipidemia who was referred to the ED by his pulmonary office because of low hemoglobin of 6.5.  Presentation, hemoglobin was 6.1 with positive fecal occult blood.  He was started on IV Protonix.  He was transfused 2 units packed red cells.  GI was consulted. ? ?Assessment & Plan: ?  ?Acute on chronic symptomatic anemia ?Possible GI bleeding ?-Hemoglobin was 6.5 on 08/02/2021.  Hemoglobin 6.1 on presentation.  Fecal occult blood positive.  Patient has had some black stool but no hematemesis. ?-Status post 2 units packed red cell transfusion on admission.  Hemoglobin 9 today.  Monitor H&H. ?-Continue IV Protonix.  Eliquis on hold. ?-Clear liquid diet  ?-GI planning for possible EGD: Timing to be decided ?  ?Acute kidney injury ?-Possibly from above.  Resolved with IV hydration.  Repeat a.m. labs.  Lisinopril on hold. ? ?Hyponatremia ?-Resolved ? ?Hyperkalemia ?-Resolved.  Lisinopril on hold. ? ?Diabetes mellitus type 2 with hyperglycemia ?-Continue CBGs with SSI. ?  ?History of PE and DVT ?-On Eliquis as an outpatient.  Eliquis on hold.  ?  ?Hypertension ?-Blood pressure on the lower side.  Hold antihypertensives for today as well.  Monitor ?  ?COPD/centrilobular emphysema ?-Respiratory status currently stable.  Continue nebs as needed ?  ?Hyperlipidemia ?-Continue statin ? ?Tobacco abuse ?-Counseled regarding cessation ?  ?  ?  ?DVT prophylaxis: SCDs ?Code Status: Full ?Family Communication: wife and mother at bedside on 08/03/2021 ?Disposition Plan: ?Status is: Observation ?The patient will require care spanning > 2 midnights and should be moved to inpatient because: of severity of illness ? ? ? ?Consultants:  GI ? ?Procedures: None ? ?Antimicrobials: None ? ? ?Subjective: ?Patient seen and examined at bedside.  Denies worsening shortness of breath, nausea, vomiting or abdominal pain.  No bowel movement since admission.  No rectal bleeding reported. ? ?Objective: ?Vitals:  ? 08/03/21 2347 08/04/21 0230 08/04/21 0500 08/04/21 0520  ?BP: 125/69 118/63  (!) 110/53  ?Pulse: 67 68  61  ?Resp: '18 19  16  '$ ?Temp: 98 ?F (36.7 ?C) 98.1 ?F (36.7 ?C)  98.1 ?F (36.7 ?C)  ?TempSrc: Oral Oral  Oral  ?SpO2: 100% 100%  98%  ?Weight:   81 kg   ?Height:      ? ? ?Intake/Output Summary (Last 24 hours) at 08/04/2021 0740 ?Last data filed at 08/04/2021 0600 ?Gross per 24 hour  ?Intake 1977.02 ml  ?Output 1350 ml  ?Net 627.02 ml  ? ?Filed Weights  ? 08/03/21 1930 08/04/21 0500  ?Weight: 83 kg 81 kg  ? ? ?Examination: ? ?General exam: Appears calm and comfortable.  Currently on room air. ?Respiratory system: Bilateral decreased breath sounds at bases ?Cardiovascular system: S1 & S2 heard, Rate controlled ?Gastrointestinal system: Abdomen is nondistended, soft and nontender. Normal bowel sounds heard. ?Extremities: No cyanosis, clubbing, edema  ?Central nervous system: Alert and oriented. No focal neurological deficits. Moving extremities ?Skin: No rashes, lesions or ulcers ?Psychiatry: Judgement and insight appear normal. Mood & affect appropriate.  ? ? ? ?Data Reviewed: I have personally reviewed following labs and imaging studies ? ?CBC: ?Recent Labs  ?Lab 08/02/21 ?1653 08/03/21 ?1454 08/04/21 ?0177  ?WBC 7.4 6.0 6.5  ?NEUTROABS 4.9  --   --   ?  HGB 6.5 Repeated and verified X2.* 6.1* 9.0*  ?HCT 21.2 Repeated and verified X2.* 20.8* 28.5*  ?MCV 71.0* 74.6* 77.7*  ?PLT 269.0 242 228  ? ?Basic Metabolic Panel: ?Recent Labs  ?Lab 08/02/21 ?1653 08/03/21 ?1454 08/04/21 ?3016  ?NA 132* 133* 138  ?K 4.9 5.4* 4.0  ?CL 100 105 109  ?CO2 22 20* 22  ?GLUCOSE 417* 664* 232*  ?BUN 30* 33* 22  ?CREATININE 1.68* 1.62* 1.13  ?CALCIUM 9.7 9.3 9.1  ?MG  --   --   2.1  ? ?GFR: ?Estimated Creatinine Clearance: 69.7 mL/min (by C-G formula based on SCr of 1.13 mg/dL). ?Liver Function Tests: ?Recent Labs  ?Lab 08/03/21 ?1454 08/04/21 ?0635  ?AST 15 15  ?ALT 15 13  ?ALKPHOS 91 69  ?BILITOT 0.6 1.6*  ?PROT 6.9 6.6  ?ALBUMIN 4.1 4.2  ? ?No results for input(s): LIPASE, AMYLASE in the last 168 hours. ?No results for input(s): AMMONIA in the last 168 hours. ?Coagulation Profile: ?Recent Labs  ?Lab 08/03/21 ?1529  ?INR 2.2*  ? ?Cardiac Enzymes: ?No results for input(s): CKTOTAL, CKMB, CKMBINDEX, TROPONINI in the last 168 hours. ?BNP (last 3 results) ?Recent Labs  ?  08/02/21 ?1653  ?PROBNP 48.0  ? ?HbA1C: ?No results for input(s): HGBA1C in the last 72 hours. ?CBG: ?Recent Labs  ?Lab 08/03/21 ?1710 08/03/21 ?1803 08/04/21 ?0001  ?GLUCAP 347* 350* 192*  ? ?Lipid Profile: ?No results for input(s): CHOL, HDL, LDLCALC, TRIG, CHOLHDL, LDLDIRECT in the last 72 hours. ?Thyroid Function Tests: ?No results for input(s): TSH, T4TOTAL, FREET4, T3FREE, THYROIDAB in the last 72 hours. ?Anemia Panel: ?No results for input(s): VITAMINB12, FOLATE, FERRITIN, TIBC, IRON, RETICCTPCT in the last 72 hours. ?Sepsis Labs: ?No results for input(s): PROCALCITON, LATICACIDVEN in the last 168 hours. ? ?No results found for this or any previous visit (from the past 240 hour(s)).  ? ? ? ? ? ?Radiology Studies: ?DG Chest 2 View ? ?Result Date: 08/02/2021 ?CLINICAL DATA:  Shortness of breath, dyspnea. EXAM: CHEST - 2 VIEW COMPARISON:  Chest x-ray 05/05/2020. FINDINGS: The heart size and mediastinal contours are within normal limits. Both lungs are clear. The visualized skeletal structures are unremarkable. IMPRESSION: No active cardiopulmonary disease. Electronically Signed   By: Ronney Asters M.D.   On: 08/02/2021 17:11  ? ?DG Chest Port 1 View ? ?Result Date: 08/03/2021 ?CLINICAL DATA:  Shortness of breath. EXAM: PORTABLE CHEST 1 VIEW COMPARISON:  August 02, 2021. FINDINGS: The heart size and mediastinal contours are  within normal limits. Both lungs are clear. The visualized skeletal structures are unremarkable. IMPRESSION: No active disease. Electronically Signed   By: Marijo Conception M.D.   On: 08/03/2021 16:03   ? ? ? ? ? ?Scheduled Meds: ? insulin aspart  0-15 Units Subcutaneous TID WC  ? insulin aspart  0-5 Units Subcutaneous QHS  ? ?Continuous Infusions: ? sodium chloride 100 mL/hr at 08/04/21 0229  ? pantoprazole 8 mg/hr (08/04/21 0002)  ? ? ? ? ? ? ? ? ?Aline August, MD ?[facility] ?08/04/2021, 7:40 AM  ? ?

## 2021-08-04 NOTE — Progress Notes (Signed)
Subjective: ?Patient states he has not had a bowel movement since yesterday. ?Tolerating clear liquids without associated nausea, vomiting or abdominal pain. ?He reports improvement in his breathing. ? ?Objective: ?Vital signs in last 24 hours: ?Temp:  [97.7 ?F (36.5 ?C)-98.7 ?F (37.1 ?C)] 98.1 ?F (36.7 ?C) (04/22 0520) ?Pulse Rate:  [61-77] 61 (04/22 0520) ?Resp:  [16-20] 16 (04/22 0520) ?BP: (104-132)/(53-72) 110/53 (04/22 0520) ?SpO2:  [95 %-100 %] 98 % (04/22 0520) ?Weight:  [81 kg-83 kg] 81 kg (04/22 0500) ?Weight change:  ?Last BM Date : 08/03/21 (+OB yesterday) ? ?PE: Not in distress, able to speak in full sentences ?GENERAL: Mild pallor  ?ABDOMEN: Distended but nontender, normoactive bowel sounds ?EXTREMITIES: No deformity ? ?Lab Results: ?Results for orders placed or performed during the hospital encounter of 08/03/21 (from the past 48 hour(s))  ?Comprehensive metabolic panel     Status: Abnormal  ? Collection Time: 08/03/21  2:54 PM  ?Result Value Ref Range  ? Sodium 133 (L) 135 - 145 mmol/L  ? Potassium 5.4 (H) 3.5 - 5.1 mmol/L  ? Chloride 105 98 - 111 mmol/L  ? CO2 20 (L) 22 - 32 mmol/L  ? Glucose, Bld 664 (HH) 70 - 99 mg/dL  ?  Comment: Glucose reference range applies only to samples taken after fasting for at least 8 hours. ?CRITICAL RESULT CALLED TO, READ BACK BY AND VERIFIED WITH: ?S.LEONARD, RN AT 1616 ON 04.21.23 BY N.THOMPSON ?  ? BUN 33 (H) 8 - 23 mg/dL  ? Creatinine, Ser 1.62 (H) 0.61 - 1.24 mg/dL  ? Calcium 9.3 8.9 - 10.3 mg/dL  ? Total Protein 6.9 6.5 - 8.1 g/dL  ? Albumin 4.1 3.5 - 5.0 g/dL  ? AST 15 15 - 41 U/L  ? ALT 15 0 - 44 U/L  ? Alkaline Phosphatase 91 38 - 126 U/L  ? Total Bilirubin 0.6 0.3 - 1.2 mg/dL  ? GFR, Estimated 45 (L) >60 mL/min  ?  Comment: (NOTE) ?Calculated using the CKD-EPI Creatinine Equation (2021) ?  ? Anion gap 8 5 - 15  ?  Comment: Performed at [facility], [place], [place] [address] [place], [place] [address]  ?CBC     Status: Abnormal  ? Collection Time:  08/03/21  2:54 PM  ?Result Value Ref Range  ? WBC 6.0 4.0 - 10.5 K/uL  ? RBC 2.79 (L) 4.22 - 5.81 MIL/uL  ? Hemoglobin 6.1 (LL) 13.0 - 17.0 g/dL  ?  Comment: REPEATED TO VERIFY ?Reticulocyte Hemoglobin testing ?may be clinically indicated, ?consider ordering this additional ?test [id number] ?THIS CRITICAL RESULT HAS VERIFIED AND BEEN CALLED TO RN W FRANKLIN BY ALEXIS Early ON 04 21 2023 AT 1539, AND HAS BEEN READ BACK.  ?  ? HCT 20.8 (L) 39.0 - 52.0 %  ? MCV 74.6 (L) 80.0 - 100.0 fL  ? MCH 21.9 (L) 26.0 - 34.0 pg  ? MCHC 29.3 (L) 30.0 - 36.0 g/dL  ? RDW 19.5 (H) 11.5 - 15.5 %  ? Platelets 242 150 - 400 K/uL  ? nRBC 0.0 0.0 - 0.2 %  ?  Comment: Performed at [facility], [place] [address] [place],  [address]  ?Type and screen [place]     Status: None (Preliminary result)  ? Collection Time: 08/03/21  2:54 PM  ?Result Value Ref Range  ? ABO/RH(D) O NEG   ? Antibody Screen NEG   ? Sample Expiration 08/06/2021,2359   ? Unit Number [id number]   ?  Blood Component Type RED CELLS,LR   ? Unit division 00   ? Status of Unit ISSUED   ? Transfusion Status OK TO TRANSFUSE   ? Crossmatch Result    ?  Compatible ?Performed at [facility], [place] [address], [place], [place] [address] ?  ? Unit Number [id number]   ? Blood Component Type RED CELLS,LR   ? Unit division 00   ? Status of Unit ISSUED   ? Transfusion Status OK TO TRANSFUSE   ? Crossmatch Result Compatible   ?POC occult blood, ED     Status: Abnormal  ? Collection Time: 08/03/21  3:26 PM  ?Result Value Ref Range  ? Fecal Occult Bld POSITIVE (A) NEGATIVE  ?Protime-INR     Status: Abnormal  ? Collection Time: 08/03/21  3:29 PM  ?Result Value Ref Range  ? Prothrombin Time 24.6 (H) 11.4 - 15.2 seconds  ? INR 2.2 (H) 0.8 - 1.2  ?  Comment: (NOTE) ?INR goal varies based on device and disease states. ?Performed at [facility], [place] [address] ?[place], [place] [address] ?  ?Prepare RBC  (crossmatch)     Status: None  ? Collection Time: 08/03/21  4:30 PM  ?Result Value Ref Range  ? Order Confirmation    ?  ORDER PROCESSED BY BLOOD BANK ?Performed at [facility], [place] [address], [place], [place] [address] ?  ?CBG monitoring, ED     Status: Abnormal  ? Collection Time: 08/03/21  5:10 PM  ?Result Value Ref Range  ? Glucose-Capillary 347 (H) 70 - 99 mg/dL  ?  Comment: Glucose reference range applies only to samples taken after fasting for at least 8 hours.  ?Glucose, capillary     Status: Abnormal  ? Collection Time: 08/03/21  6:03 PM  ?Result Value Ref Range  ? Glucose-Capillary 350 (H) 70 - 99 mg/dL  ?  Comment: Glucose reference range applies only to samples taken after fasting for at least 8 hours.  ?Glucose, capillary     Status: Abnormal  ? Collection Time: 08/04/21 12:01 AM  ?Result Value Ref Range  ? Glucose-Capillary 192 (H) 70 - 99 mg/dL  ?  Comment: Glucose reference range applies only to samples taken after fasting for at least 8 hours.  ?Hemoglobin A1c     Status: Abnormal  ? Collection Time: 08/04/21  6:35 AM  ?Result Value Ref Range  ? Hgb A1c MFr Bld 8.6 (H) 4.8 - 5.6 %  ?  Comment: (NOTE) ?Pre diabetes:          5.7%-6.4% ? ?Diabetes:              >6.4% ? ?Glycemic control for   <7.0% ?adults with diabetes ?  ? Mean Plasma Glucose 200.12 mg/dL  ?  Comment: Performed at [facility], [place] [address] [place], [place] [address]  ?CBC     Status: Abnormal  ? Collection Time: 08/04/21  6:35 AM  ?Result Value Ref Range  ? WBC 6.5 4.0 - 10.5 K/uL  ? RBC 3.67 (L) 4.22 - 5.81 MIL/uL  ? Hemoglobin 9.0 (L) 13.0 - 17.0 g/dL  ?  Comment: REPEATED TO VERIFY ?POST TRANSFUSION SPECIMEN ?  ? HCT 28.5 (L) 39.0 - 52.0 %  ? MCV 77.7 (L) 80.0 - 100.0 fL  ? MCH 24.5 (L) 26.0 - 34.0 pg  ? MCHC 31.6 30.0 - 36.0 g/dL  ? RDW 19.6 (H) 11.5 - 15.5 %  ? Platelets  228 150 - 400 K/uL  ? nRBC 0.0 0.0 - 0.2 %  ?  Comment: Performed at [facility], [place] [address],  [place], [place] [address]  ?Comprehensive metabolic panel     Status: Abnormal  ? Collection Time: 08/04/21  6:35 AM  ?Result Value Ref Range  ? Sodium 138 135 - 145 mmol/L  ? Potassium 4.0 3.5 - 5.1 mmol/L  ?  Comment: DELTA CHECK NOTED  ? Chloride 109 98 - 111 mmol/L  ? CO2 22 22 - 32 mmol/L  ? Glucose, Bld 232 (H) 70 - 99 mg/dL  ?  Comment: Glucose reference range applies only to samples taken after fasting for at least 8 hours.  ? BUN 22 8 - 23 mg/dL  ? Creatinine, Ser 1.13 0.61 - 1.24 mg/dL  ? Calcium 9.1 8.9 - 10.3 mg/dL  ? Total Protein 6.6 6.5 - 8.1 g/dL  ? Albumin 4.2 3.5 - 5.0 g/dL  ? AST 15 15 - 41 U/L  ? ALT 13 0 - 44 U/L  ? Alkaline Phosphatase 69 38 - 126 U/L  ? Total Bilirubin 1.6 (H) 0.3 - 1.2 mg/dL  ? GFR, Estimated >60 >60 mL/min  ?  Comment: (NOTE) ?Calculated using the CKD-EPI Creatinine Equation (2021) ?  ? Anion gap 7 5 - 15  ?  Comment: Performed at [facility], [place] [address] [place], [place] [address]  ?Magnesium     Status: None  ? Collection Time: 08/04/21  6:35 AM  ?Result Value Ref Range  ? Magnesium 2.1 1.7 - 2.4 mg/dL  ?  Comment: Performed at [facility], [place] [address] [place], [place] [address]  ?Glucose, capillary     Status: Abnormal  ? Collection Time: 08/04/21  7:57 AM  ?Result Value Ref Range  ? Glucose-Capillary 219 (H) 70 - 99 mg/dL  ?  Comment: Glucose reference range applies only to samples taken after fasting for at least 8 hours.  ? ? ?Studies/Results: ?DG Chest 2 View ? ?Result Date: 08/02/2021 ?CLINICAL DATA:  Shortness of breath, dyspnea. EXAM: CHEST - 2 VIEW COMPARISON:  Chest x-ray 05/05/2020. FINDINGS: The heart size and mediastinal contours are within normal limits. Both lungs are clear. The visualized skeletal structures are unremarkable. IMPRESSION: No active cardiopulmonary disease. Electronically Signed   By: Ronney Asters M.D.   On: 08/02/2021 17:11  ? ?DG Chest Port 1 View ? ?Result Date: 08/03/2021 ?CLINICAL DATA:   Shortness of breath. EXAM: PORTABLE CHEST 1 VIEW COMPARISON:  August 02, 2021. FINDINGS: The heart size and mediastinal contours are within normal limits. Both lungs are clear. The visualized skeletal structures ar

## 2021-08-05 ENCOUNTER — Encounter (HOSPITAL_COMMUNITY): Payer: Self-pay | Admitting: Internal Medicine

## 2021-08-05 ENCOUNTER — Inpatient Hospital Stay (HOSPITAL_COMMUNITY): Payer: Medicare Other | Admitting: Certified Registered Nurse Anesthetist

## 2021-08-05 ENCOUNTER — Encounter (HOSPITAL_COMMUNITY)
Admission: EM | Disposition: A | Discharge status: Home / Self Care | Payer: Self-pay | Source: Home / Self Care | Attending: Internal Medicine

## 2021-08-05 DIAGNOSIS — J449 Chronic obstructive pulmonary disease, unspecified: Secondary | ICD-10-CM

## 2021-08-05 DIAGNOSIS — K31819 Angiodysplasia of stomach and duodenum without bleeding: Secondary | ICD-10-CM

## 2021-08-05 DIAGNOSIS — I10 Essential (primary) hypertension: Secondary | ICD-10-CM

## 2021-08-05 DIAGNOSIS — K259 Gastric ulcer, unspecified as acute or chronic, without hemorrhage or perforation: Secondary | ICD-10-CM

## 2021-08-05 HISTORY — PX: ESOPHAGOGASTRODUODENOSCOPY (EGD) WITH PROPOFOL: SHX5813

## 2021-08-05 HISTORY — PX: GIVENS CAPSULE STUDY: SHX5432

## 2021-08-05 HISTORY — PX: HOT HEMOSTASIS: SHX5433

## 2021-08-05 LAB — CBC WITH DIFFERENTIAL/PLATELET
Abs Immature Granulocytes: 0.02 10*3/uL (ref 0.00–0.07)
Basophils Absolute: 0 10*3/uL (ref 0.0–0.1)
Basophils Relative: 1 %
Eosinophils Absolute: 0.3 10*3/uL (ref 0.0–0.5)
Eosinophils Relative: 6 %
HCT: 29.3 % — ABNORMAL LOW (ref 39.0–52.0)
Hemoglobin: 8.9 g/dL — ABNORMAL LOW (ref 13.0–17.0)
Immature Granulocytes: 0 %
Lymphocytes Relative: 22 %
Lymphs Abs: 1.2 10*3/uL (ref 0.7–4.0)
MCH: 24.3 pg — ABNORMAL LOW (ref 26.0–34.0)
MCHC: 30.4 g/dL (ref 30.0–36.0)
MCV: 80.1 fL (ref 80.0–100.0)
Monocytes Absolute: 0.5 10*3/uL (ref 0.1–1.0)
Monocytes Relative: 8 %
Neutro Abs: 3.4 10*3/uL (ref 1.7–7.7)
Neutrophils Relative %: 63 %
Platelets: 193 10*3/uL (ref 150–400)
RBC: 3.66 MIL/uL — ABNORMAL LOW (ref 4.22–5.81)
RDW: 19.7 % — ABNORMAL HIGH (ref 11.5–15.5)
WBC: 5.5 10*3/uL (ref 4.0–10.5)
nRBC: 0 % (ref 0.0–0.2)

## 2021-08-05 LAB — BASIC METABOLIC PANEL
Anion gap: 5 (ref 5–15)
BUN: 9 mg/dL (ref 8–23)
CO2: 22 mmol/L (ref 22–32)
Calcium: 8.8 mg/dL — ABNORMAL LOW (ref 8.9–10.3)
Chloride: 112 mmol/L — ABNORMAL HIGH (ref 98–111)
Creatinine, Ser: 0.97 mg/dL (ref 0.61–1.24)
GFR, Estimated: >60 mL/min (ref >60)
Glucose, Bld: 196 mg/dL — ABNORMAL HIGH (ref 70–99)
Potassium: 3.7 mmol/L (ref 3.5–5.1)
Sodium: 139 mmol/L (ref 135–145)

## 2021-08-05 LAB — GLUCOSE, CAPILLARY
Glucose-Capillary: 205 mg/dL — ABNORMAL HIGH (ref 70–99)
Glucose-Capillary: 162 mg/dL — ABNORMAL HIGH (ref 70–99)
Glucose-Capillary: 241 mg/dL — ABNORMAL HIGH (ref 70–99)

## 2021-08-05 LAB — MAGNESIUM: Magnesium: 2.1 mg/dL (ref 1.7–2.4)

## 2021-08-05 SURGERY — ESOPHAGOGASTRODUODENOSCOPY (EGD) WITH PROPOFOL
Anesthesia: Monitor Anesthesia Care

## 2021-08-05 MED ORDER — PROPOFOL 10 MG/ML IV BOLUS
INTRAVENOUS | Status: DC | PRN
  Administered 2021-08-05 (×2): 30 mg via INTRAVENOUS
  Administered 2021-08-05: 70 mg via INTRAVENOUS
  Administered 2021-08-05: 40 mg via INTRAVENOUS

## 2021-08-05 MED ORDER — LACTATED RINGERS IV SOLN: INTRAVENOUS | Status: DC | PRN

## 2021-08-05 SURGICAL SUPPLY — 16 items
BLOCK BITE 60FR ADLT L/F BLUE (MISCELLANEOUS) ×2 IMPLANT
ELECT REM PT RETURN 9FT ADLT (ELECTROSURGICAL)
ELECTRODE REM PT RTRN 9FT ADLT (ELECTROSURGICAL) IMPLANT
FORCEP RJ3 GP 1.8X160 W-NEEDLE (CUTTING FORCEPS) IMPLANT
FORCEPS BIOP RAD 4 LRG CAP 4 (CUTTING FORCEPS) IMPLANT
NDL SCLEROTHERAPY 25GX240 (NEEDLE) IMPLANT
NEEDLE SCLEROTHERAPY 25GX240 (NEEDLE) IMPLANT
PROBE APC STR FIRE (PROBE) IMPLANT
PROBE INJECTION GOLD (MISCELLANEOUS)
PROBE INJECTION GOLD 7FR (MISCELLANEOUS) IMPLANT
SNARE SHORT THROW 13M SML OVAL (MISCELLANEOUS) IMPLANT
SYR 50ML LL SCALE MARK (SYRINGE) IMPLANT
TOWEL COTTON PACK 4EA (MISCELLANEOUS) ×4 IMPLANT
TUBING ENDO SMARTCAP PENTAX (MISCELLANEOUS) ×4 IMPLANT
TUBING IRRIGATION ENDOGATOR (MISCELLANEOUS) ×2 IMPLANT
WATER STERILE IRR 1000ML POUR (IV SOLUTION) IMPLANT

## 2021-08-05 NOTE — Anesthesia Postprocedure Evaluation (Signed)
Anesthesia Post Note ? ?Patient: Elijah Sandoval ? ?Procedure(s) Performed: ESOPHAGOGASTRODUODENOSCOPY (EGD) WITH PROPOFOL ?GIVENS CAPSULE STUDY ?HOT HEMOSTASIS (ARGON PLASMA COAGULATION/BICAP) ? ?  ? ?Patient location during evaluation: PACU ?Anesthesia Type: MAC ?Level of consciousness: awake and alert ?Pain management: pain level controlled ?Vital Signs Assessment: post-procedure vital signs reviewed and stable ?Respiratory status: spontaneous breathing, nonlabored ventilation and respiratory function stable ?Cardiovascular status: stable and blood pressure returned to baseline ?Postop Assessment: no apparent nausea or vomiting ?Anesthetic complications: no ? ? ?No notable events documented. ? ?Last Vitals:  ?Vitals:  ? 08/05/21 1024 08/05/21 1034  ?BP: 120/75 139/86  ?Pulse: 88 76  ?Resp: (!) 23 (!) 22  ?Temp:    ?SpO2: 100% 96%  ?  ?Last Pain:  ?Vitals:  ? 08/05/21 1034  ?TempSrc:   ?PainSc: 0-No pain  ? ? ?  ?  ?  ?  ?  ?  ? ?Dhriti Fales,W. EDMOND ? ? ? ? ?

## 2021-08-05 NOTE — Transfer of Care (Signed)
Immediate Anesthesia Transfer of Care Note ? ?Patient: Elijah Sandoval ? ?Procedure(s) Performed: ESOPHAGOGASTRODUODENOSCOPY (EGD) WITH PROPOFOL ?GIVENS CAPSULE STUDY ?HOT HEMOSTASIS (ARGON PLASMA COAGULATION/BICAP) ? ?Patient Location: PACU ? ?Anesthesia Type:MAC ? ?Level of Consciousness: drowsy ? ?Airway & Oxygen Therapy: Patient Spontanous Breathing and Patient connected to face mask ? ?Post-op Assessment: Report given to RN and Post -op Vital signs reviewed and stable ? ?Post vital signs: Reviewed and stable ? ?Last Vitals:  ?Vitals Value Taken Time  ?BP    ?Temp    ?Pulse    ?Resp    ?SpO2    ? ? ?Last Pain:  ?Vitals:  ? 08/05/21 0946  ?TempSrc:   ?PainSc: 0-No pain  ?   ? ?  ? ?Complications: No notable events documented. ?

## 2021-08-05 NOTE — Op Note (Signed)
[facility] ?Patient Name: Elijah Sandoval ?Procedure Date: 08/05/2021 ?MRN: [id number] ?Attending MD: Ronnette Juniper , MD ?Date of Birth: [birth year] ?CSN: [id number] ?Age: 71 ?Admit Type: Inpatient ?Procedure:                Upper GI endoscopy ?Indications:              Unexplained iron deficiency anemia ?Providers:                Ronnette Juniper, MD, Dulcy Fanny, Primitivo Gauze  ?                          [place], Technician, [employer],  ?                          Technician ?Referring MD:             [facility] ?Medicines:                Monitored Anesthesia Care ?Complications:            No immediate complications. ?Estimated Blood Loss:     Estimated blood loss: none. ?Procedure:                Pre-Anesthesia Assessment: ?                          - Prior to the procedure, a History and Physical  ?                          was performed, and patient medications and  ?                          allergies were reviewed. The patient's tolerance of  ?                          previous anesthesia was also reviewed. The risks  ?                          and benefits of the procedure and the sedation  ?                          options and risks were discussed with the patient.  ?                          All questions were answered, and informed consent  ?                          was obtained. Prior Anticoagulants: The patient has  ?                          taken Eliquis (apixaban), last dose was 2 days  ?                          prior to procedure. ASA Grade Assessment: II - A  ?  patient with mild systemic disease. After reviewing  ?                          the risks and benefits, the patient was deemed in  ?                          satisfactory condition to undergo the procedure. ?                          After obtaining informed consent, the endoscope was  ?                          passed under direct vision. Throughout the  ?                           procedure, the patient's blood pressure, pulse, and  ?                          oxygen saturations were monitored continuously. The  ?                          GIF-H190 ([id number]) Olympus endoscope was introduced  ?                          through the mouth, and advanced to the second part  ?                          of duodenum. The upper GI endoscopy was  ?                          accomplished without difficulty. The patient  ?                          tolerated the procedure well. ?Scope In: ?Scope Out: ?Findings: ?     The examined esophagus was normal. ?     One non-bleeding linear and superficial gastric ulcer with a clean ulcer  ?     base (Forrest Class III) was found in the cardia. The lesion was 8 mm in  ?     largest dimension. ?     A single small angioectasia with no bleeding was found in the gastric  ?     antrum. Coagulation for tissue destruction using argon plasma at 1  ?     liter/minute and 20 watts was successful. ?     The examined duodenum was normal. Using the endoscope, the video capsule  ?     enteroscope was advanced into the first portion of the duodenum. ?Impression:               - Normal esophagus. ?                          - Non-bleeding gastric ulcer with a clean ulcer  ?                          base (Forrest Class III). ?                          -  A single non-bleeding angioectasia in the  ?                          stomach. Treated with argon plasma coagulation  ?                          (APC). ?                          - Normal examined duodenum. ?                          - Successful completion of the Video Capsule  ?                          Enteroscope placement. ?                          - No specimens collected. ?Moderate Sedation: ?     Patient did not receive moderate sedation for this procedure, but  ?     instead received monitored anesthesia care. ?Recommendation:           - Please follow instructions for small bowel  ?                          pillcam orders for  diet advancement. ?Procedure Code(s):        --- Professional --- ?                          [phone], Esophagogastroduodenoscopy, flexible,  ?                          transoral; with ablation of tumor(s), polyp(s), or  ?                          other lesion(s) (includes pre- and post-dilation  ?                          and guide wire passage, when performed) ?Diagnosis Code(s):        --- Professional --- ?                          K25.9, Gastric ulcer, unspecified as acute or  ?                          chronic, without hemorrhage or perforation ?                          K31.819, Angiodysplasia of stomach and duodenum  ?                          without bleeding ?                          D50.9, Iron deficiency anemia, unspecified ?CPT copyright 2019 [employer]. All rights reserved. ?The codes documented in this report are preliminary and upon coder review may  ?  be revised to meet current compliance requirements. ?Ronnette Juniper, MD ?08/05/2021 10:12:28 AM ?This report has been signed electronically. ?Number of Addenda: 0 ?

## 2021-08-05 NOTE — Progress Notes (Signed)
Givens Capsule endoscopy performed during EGD per order of MD Therisa Doyne. ? ?Capsule deployed at 1003. ? ?Per Givens capsule instructions, pt to remain NPO until 1203, at which time he may progress to clear liquids (no red liquids). At 1403, he may have a small snack. At 1803, he may return to his previously ordered diet. ? ?The study will conclude at 2203 at which time the belt and recorder may be removed. ? ?Endo staff will pick up the recorder and belt on 4/24 in the AM. ? ?Givens capsule instructions provided to pt in recovery. Pt demonstrated understanding. ? ?Please reach out to the endoscopy unit at [phone] with any questions. If you reach our voicemail, please call the hospital operator and ask for the endoscopy nurse on call. ? ?Debarah Crape, RN ?08/05/21 ?10:29 AM ? ?

## 2021-08-05 NOTE — Plan of Care (Signed)

## 2021-08-05 NOTE — Interval H&P Note (Signed)
History and Physical Interval Note: ?70/male on eliquis last dose  on 08/03/21 with symptomatic anemia, history of IDA for EGD with possible small bowel pillcam deployment with propofol. ?08/05/2021 ?9:48 AM ? ?Elijah Sandoval  has presented today for EGD with possible small bowel pillcam placement with the diagnosis of anemia, melena, eliquis use.  The various methods of treatment have been discussed with the patient and family. After consideration of risks, benefits and other options for treatment, the patient has consented to  Procedure(s): ?ESOPHAGOGASTRODUODENOSCOPY (EGD) WITH PROPOFOL (N/A) ?GIVENS CAPSULE STUDY (N/A) as a surgical intervention.  The patient's history has been reviewed, patient examined, no change in status, stable for surgery.  I have reviewed the patient's chart and labs.  Questions were answered to the patient's satisfaction.   ? ? ?Ronnette Juniper ? ? ?

## 2021-08-05 NOTE — Anesthesia Preprocedure Evaluation (Addendum)
Anesthesia Evaluation  ?Patient identified by MRN, date of birth, ID band ?Patient awake ? ? ? ?Reviewed: ?Allergy & Precautions, H&P , NPO status , Patient's Chart, lab work & pertinent test results, reviewed documented beta blocker date and time  ? ?Airway ?Mallampati: II ? ?TM Distance: >3 FB ?Neck ROM: Full ? ? ? Dental ?no notable dental hx. ?(+) Edentulous Upper, Edentulous Lower, Dental Advisory Given ?  ?Pulmonary ?COPD, Current Smoker,  ?  ?Pulmonary exam normal ?breath sounds clear to auscultation ? ? ? ? ? ? Cardiovascular ?hypertension, Pt. on medications and Pt. on home beta blockers ? ?Rhythm:Regular Rate:Normal ? ? ?  ?Neuro/Psych ?TIAnegative psych ROS  ? GI/Hepatic ?negative GI ROS, Neg liver ROS,   ?Endo/Other  ?diabetes, Type 2, Oral Hypoglycemic Agents ? Renal/GU ?negative Renal ROS  ?negative genitourinary ?  ?Musculoskeletal ? ? Abdominal ?  ?Peds ? Hematology ? ?(+) Blood dyscrasia, anemia ,   ?Anesthesia Other Findings ? ? Reproductive/Obstetrics ?negative OB ROS ? ?  ? ? ? ? ? ? ? ? ? ? ? ? ? ?  ?  ? ? ? ? ? ? ? ?Anesthesia Physical ?Anesthesia Plan ? ?ASA: 2 ? ?Anesthesia Plan: MAC  ? ?Post-op Pain Management: Minimal or no pain anticipated  ? ?Induction: Intravenous ? ?PONV Risk Score and Plan: 0 and Propofol infusion ? ?Airway Management Planned: Natural Airway and Simple Face Mask ? ?Additional Equipment:  ? ?Intra-op Plan:  ? ?Post-operative Plan:  ? ?Informed Consent: I have reviewed the patients History and Physical, chart, labs and discussed the procedure including the risks, benefits and alternatives for the proposed anesthesia with the patient or authorized representative who has indicated his/her understanding and acceptance.  ? ? ? ?Dental advisory given ? ?Plan Discussed with: CRNA ? ?Anesthesia Plan Comments:   ? ? ? ? ? ? ?Anesthesia Quick Evaluation ? ?

## 2021-08-05 NOTE — Progress Notes (Signed)
?PROGRESS NOTE ? ? ? ?Elijah Sandoval  JOI:[id number] DOB: Jul 05, 1950 DOA: 08/03/2021 ?PCP: Sonia Side., FNP  ? ?Brief Narrative:  ?71 y.o. male with medical history significant of diabetes mellitus type 2, hypertension, PE and DVT on Eliquis, COPD/emphysema, tobacco abuse, anemia of chronic disease, hyperlipidemia who was referred to the ED by his pulmonary office because of low hemoglobin of 6.5.  Presentation, hemoglobin was 6.1 with positive fecal occult blood.  He was started on IV Protonix.  He was transfused 2 units packed red cells.  GI was consulted. ? ?Assessment & Plan: ?  ?Acute on chronic symptomatic anemia ?Possible GI bleeding ?-Hemoglobin was 6.5 on 08/02/2021.  Hemoglobin 6.1 on presentation.  Fecal occult blood positive.  Patient has had some black stool but no hematemesis. ?-Status post 2 units packed red cell transfusion on admission.  Hemoglobin pending today.  Monitor H&H. ?-Continue IV Protonix.  Eliquis on hold. ?-GI planning for possible EGD today ? ?Acute kidney injury ?-Possibly from above.  Resolved with IV hydration.  Labs pending today.  Lisinopril on hold. ? ?Hyponatremia ?-Resolved ? ?Hyperkalemia ?-Resolved.  Lisinopril on hold. ? ?Diabetes mellitus type 2 with hyperglycemia ?-Continue CBGs with SSI. ?  ?History of PE and DVT ?-On Eliquis as an outpatient.  Eliquis on hold.  ?  ?Hypertension ?-Blood pressure intermittently on the lower side.  Hold antihypertensives for today as well.  Monitor ?  ?COPD/centrilobular emphysema ?-Respiratory status currently stable.  Continue nebs as needed ?  ?Hyperlipidemia ?-Continue statin ? ?Tobacco abuse ?-Counseled regarding cessation ?  ?  ?  ?DVT prophylaxis: SCDs ?Code Status: Full ?Family Communication: wife and mother at bedside on 08/03/2021 ?Disposition Plan: ?Status is: inpatient because: of severity of illness.  Need for GI intervention ? ? ? ?Consultants: GI ? ?Procedures: None ? ?Antimicrobials: None ? ? ?Subjective: ?Patient seen and  examined at bedside.  No black or bloody stools reported.  Denies worsening abdominal pain, nausea, vomiting or fever. ?Objective: ?Vitals:  ? 08/04/21 0520 08/04/21 1505 08/04/21 2028 08/05/21 0300  ?BP: (!) 110/53 133/71 (!) 150/57 (!) 131/54  ?Pulse: 61 60 63 72  ?Resp: '16 17 19 19  '$ ?Temp: 98.1 ?F (36.7 ?C) 98 ?F (36.7 ?C) 98.2 ?F (36.8 ?C) 97.7 ?F (36.5 ?C)  ?TempSrc: Oral Oral Oral Oral  ?SpO2: 98% 99% 100% 100%  ?Weight:    84.3 kg  ?Height:      ? ? ?Intake/Output Summary (Last 24 hours) at 08/05/2021 0746 ?Last data filed at 08/05/2021 4709 ?Gross per 24 hour  ?Intake 3809.72 ml  ?Output 5130 ml  ?Net -1320.28 ml  ? ? ?Filed Weights  ? 08/03/21 1930 08/04/21 0500 08/05/21 0300  ?Weight: 83 kg 81 kg 84.3 kg  ? ? ?Examination: ? ?General: On room air.  No distress.  ?respiratory: Decreased breath sounds at bases bilaterally with some crackles ?CVS: Currently rate controlled; S1-S2 heard  ?abdominal: Soft, nontender, slightly distended, no organomegaly; bowel sounds are heard  ?extremities: Trace lower extremity edema; no clubbing.   ? ? ? ? ?Data Reviewed: I have personally reviewed following labs and imaging studies ? ?CBC: ?Recent Labs  ?Lab 08/02/21 ?1653 08/03/21 ?1454 08/04/21 ?6283  ?WBC 7.4 6.0 6.5  ?NEUTROABS 4.9  --   --   ?HGB 6.5 Repeated and verified X2.* 6.1* 9.0*  ?HCT 21.2 Repeated and verified X2.* 20.8* 28.5*  ?MCV 71.0* 74.6* 77.7*  ?PLT 269.0 242 228  ? ? ?Basic Metabolic Panel: ?Recent Labs  ?Lab 08/02/21 ?  1653 08/03/21 ?1454 08/04/21 ?2330  ?NA 132* 133* 138  ?K 4.9 5.4* 4.0  ?CL 100 105 109  ?CO2 22 20* 22  ?GLUCOSE 417* 664* 232*  ?BUN 30* 33* 22  ?CREATININE 1.68* 1.62* 1.13  ?CALCIUM 9.7 9.3 9.1  ?MG  --   --  2.1  ? ? ?GFR: ?Estimated Creatinine Clearance: 72.5 mL/min (by C-G formula based on SCr of 1.13 mg/dL). ?Liver Function Tests: ?Recent Labs  ?Lab 08/03/21 ?1454 08/04/21 ?0635  ?AST 15 15  ?ALT 15 13  ?ALKPHOS 91 69  ?BILITOT 0.6 1.6*  ?PROT 6.9 6.6  ?ALBUMIN 4.1 4.2  ? ? ?No  results for input(s): LIPASE, AMYLASE in the last 168 hours. ?No results for input(s): AMMONIA in the last 168 hours. ?Coagulation Profile: ?Recent Labs  ?Lab 08/03/21 ?1529  ?INR 2.2*  ? ? ?Cardiac Enzymes: ?No results for input(s): CKTOTAL, CKMB, CKMBINDEX, TROPONINI in the last 168 hours. ?BNP (last 3 results) ?Recent Labs  ?  08/02/21 ?1653  ?PROBNP 48.0  ? ? ?HbA1C: ?Recent Labs  ?  08/04/21 ?0635  ?HGBA1C 8.6*  ? ?CBG: ?Recent Labs  ?Lab 08/04/21 ?0762 08/04/21 ?1158 08/04/21 ?1700 08/04/21 ?2041 08/05/21 ?0730  ?GLUCAP 219* 293* 204* 263* 205*  ? ? ?Lipid Profile: ?No results for input(s): CHOL, HDL, LDLCALC, TRIG, CHOLHDL, LDLDIRECT in the last 72 hours. ?Thyroid Function Tests: ?No results for input(s): TSH, T4TOTAL, FREET4, T3FREE, THYROIDAB in the last 72 hours. ?Anemia Panel: ?No results for input(s): VITAMINB12, FOLATE, FERRITIN, TIBC, IRON, RETICCTPCT in the last 72 hours. ?Sepsis Labs: ?No results for input(s): PROCALCITON, LATICACIDVEN in the last 168 hours. ? ?No results found for this or any previous visit (from the past 240 hour(s)).  ? ? ? ? ? ?Radiology Studies: ?DG Chest Port 1 View ? ?Result Date: 08/03/2021 ?CLINICAL DATA:  Shortness of breath. EXAM: PORTABLE CHEST 1 VIEW COMPARISON:  August 02, 2021. FINDINGS: The heart size and mediastinal contours are within normal limits. Both lungs are clear. The visualized skeletal structures are unremarkable. IMPRESSION: No active disease. Electronically Signed   By: Marijo Conception M.D.   On: 08/03/2021 16:03   ? ? ? ? ? ?Scheduled Meds: ? insulin aspart  0-15 Units Subcutaneous TID WC  ? insulin aspart  0-5 Units Subcutaneous QHS  ? ?Continuous Infusions: ? sodium chloride 100 mL/hr at 08/05/21 0718  ? pantoprazole 8 mg/hr (08/04/21 2155)  ? ? ? ? ? ? ? ? ?Aline August, MD ?[facility] ?08/05/2021, 7:46 AM  ? ?

## 2021-08-05 NOTE — Plan of Care (Signed)
?  Problem: Clinical Measurements: Goal: Ability to maintain clinical measurements within normal limits will improve Outcome: Progressing Goal: Will remain free from infection Outcome: Progressing   Problem: Activity: Goal: Risk for activity intolerance will decrease Outcome: Progressing   Problem: Safety: Goal: Ability to remain free from injury will improve Outcome: Progressing   

## 2021-08-06 ENCOUNTER — Encounter (HOSPITAL_COMMUNITY): Payer: Self-pay | Admitting: Gastroenterology

## 2021-08-06 LAB — BASIC METABOLIC PANEL
Anion gap: 6 (ref 5–15)
BUN: 10 mg/dL (ref 8–23)
CO2: 22 mmol/L (ref 22–32)
Calcium: 8.6 mg/dL — ABNORMAL LOW (ref 8.9–10.3)
Chloride: 109 mmol/L (ref 98–111)
Creatinine, Ser: 1.18 mg/dL (ref 0.61–1.24)
GFR, Estimated: >60 mL/min (ref >60)
Glucose, Bld: 257 mg/dL — ABNORMAL HIGH (ref 70–99)
Potassium: 3.9 mmol/L (ref 3.5–5.1)
Sodium: 137 mmol/L (ref 135–145)

## 2021-08-06 LAB — CBC WITH DIFFERENTIAL/PLATELET
Abs Immature Granulocytes: 0.02 10*3/uL (ref 0.00–0.07)
Basophils Absolute: 0 10*3/uL (ref 0.0–0.1)
Basophils Relative: 0 %
Eosinophils Absolute: 0.3 10*3/uL (ref 0.0–0.5)
Eosinophils Relative: 6 %
HCT: 26.2 % — ABNORMAL LOW (ref 39.0–52.0)
Hemoglobin: 8.1 g/dL — ABNORMAL LOW (ref 13.0–17.0)
Immature Granulocytes: 0 %
Lymphocytes Relative: 26 %
Lymphs Abs: 1.4 10*3/uL (ref 0.7–4.0)
MCH: 24.8 pg — ABNORMAL LOW (ref 26.0–34.0)
MCHC: 30.9 g/dL (ref 30.0–36.0)
MCV: 80.4 fL (ref 80.0–100.0)
Monocytes Absolute: 0.6 10*3/uL (ref 0.1–1.0)
Monocytes Relative: 11 %
Neutro Abs: 3.1 10*3/uL (ref 1.7–7.7)
Neutrophils Relative %: 57 %
Platelets: 169 10*3/uL (ref 150–400)
RBC: 3.26 MIL/uL — ABNORMAL LOW (ref 4.22–5.81)
RDW: 19.9 % — ABNORMAL HIGH (ref 11.5–15.5)
WBC: 5.5 10*3/uL (ref 4.0–10.5)
nRBC: 0 % (ref 0.0–0.2)

## 2021-08-06 LAB — MAGNESIUM: Magnesium: 2 mg/dL (ref 1.7–2.4)

## 2021-08-06 LAB — GLUCOSE, CAPILLARY
Glucose-Capillary: 251 mg/dL — ABNORMAL HIGH (ref 70–99)
Glucose-Capillary: 228 mg/dL — ABNORMAL HIGH (ref 70–99)

## 2021-08-06 MED ORDER — APIXABAN 5 MG PO TABS: 5.0000 mg | ORAL_TABLET | Freq: Two times a day (BID) | ORAL | Status: DC

## 2021-08-06 MED ORDER — PANTOPRAZOLE SODIUM 40 MG PO TBEC
40.0000 mg | DELAYED_RELEASE_TABLET | Freq: Two times a day (BID) | ORAL | Status: DC
  Administered 2021-08-06: 40 mg via ORAL
  Filled 2021-08-06: qty 1

## 2021-08-06 MED ORDER — FAMOTIDINE 20 MG PO TABS
20.0000 mg | ORAL_TABLET | Freq: Two times a day (BID) | ORAL | Status: AC | PRN
Start: 2021-08-06 — End: ?

## 2021-08-06 MED ORDER — TRIAMCINOLONE ACETONIDE 0.1 % EX CREA: 1.0000 "application " | TOPICAL_CREAM | Freq: Every day | CUTANEOUS | Status: AC | PRN

## 2021-08-06 NOTE — Progress Notes (Signed)
AVS reviewed ?Eliquis & all other home meds reviewed. ?Followup appointments reviewed. ?Questions/concerns addressed & pt verbalized understanding. ?

## 2021-08-06 NOTE — Progress Notes (Addendum)
Inpatient Diabetes Program Recommendations ? ?AACE/ADA: New Consensus Statement on Inpatient Glycemic Control (2015) ? ?Target Ranges:  Prepandial:   less than 140 mg/dL ?     Peak postprandial:   less than 180 mg/dL (1-2 hours) ?     Critically ill patients:  140 - 180 mg/dL  ? ?Lab Results  ?Component Value Date  ? GLUCAP 251 (H) 08/06/2021  ? HGBA1C 8.6 (H) 08/04/2021  ? ? ?Review of Glycemic Control ? Latest Reference Range & Units 08/05/21 11:21 08/05/21 16:21 08/06/21 07:40  ?Glucose-Capillary 70 - 99 mg/dL 162 (H) 241 (H) 251 (H)  ?(H): Data is abnormally high ?Diabetes history: Type 2 DM ?Outpatient Diabetes medications: Glipizide 10 mg BID ?Current orders for Inpatient glycemic control: Novolog 0-15 units TID & Novolog 0-5 units QHS ? ?Inpatient Diabetes Program Recommendations:   ? ?Consider adding Semglee 12 units QD.  ?Assuming A1C is inaccurate due to low Hgb on admission.  ? ?Addendum: Spoke with patient regarding outpatient diabetes management. Patient reports that blood sugars have been >200 mg/dL outpatient which is consistent with higher A1C than compared to inpatient result.  ?Explained what a A1c is and what it measures. Also reviewed goal A1c with patient, importance of good glucose control @ home, and blood sugar goals. Reviewed patho of DM, need for improved control, survival skills, when to call MD, vascular changes and commorbidities.  ?Patient has a meter and testing supplies. Also, has a follow up appointment scheduled. No further questions at this time.  ? ?Thanks, ?Bronson Curb, MSN, RNC-OB ?Diabetes Coordinator ?[phone] (8a-5p) ? ? ? ?

## 2021-08-06 NOTE — Discharge Summary (Signed)
Physician Discharge Summary  ?Elijah Sandoval TDD:[id number] DOB: 21-Nov-1950 DOA: 08/03/2021 ? ?PCP: Sonia Side., FNP ? ?Admit date: 08/03/2021 ?Discharge date: 08/06/2021 ? ?Admitted From: Home ?Disposition: Home ? ?Recommendations for Outpatient Follow-up:  ?Follow up with PCP in 1 week with repeat CBC/BMP ?Outpatient follow-up with GI ?Resume Eliquis from 08/07/2021 as per GI ?Follow up in ED if symptoms worsen or new appear ? ? ?Home Health: No ?Equipment/Devices: None ? ?Discharge Condition: Stable ?CODE STATUS: Full ?Diet recommendation: Heart healthy/carb modified ? ?Brief/Interim Summary: ?71 y.o. male with medical history significant of diabetes mellitus type 2, hypertension, PE and DVT on Eliquis, COPD/emphysema, tobacco abuse, anemia of chronic disease, hyperlipidemia who was referred to the ED by his pulmonary office because of low hemoglobin of 6.5.  Presentation, hemoglobin was 6.1 with positive fecal occult blood.  He was started on IV Protonix.  He was transfused 2 units packed red cells.  GI was consulted.  He underwent EGD and capsule endoscopy.  GI has cleared the patient for discharge home today and recommended to resume Eliquis from 08/07/2021.  Discharge patient home today.  Outpatient follow-up with PCP stable currently. ? ?Discharge Diagnoses:  ? ?Acute on chronic symptomatic anemia ?Possible GI bleeding ?-Hemoglobin was 6.5 on 08/02/2021.  Hemoglobin 6.1 on presentation.  Fecal occult blood positive.  ?-Status post 2 units packed red 8.1 today. ?-Treated with IV Protonix which has been switched to oral Protonix twice a day. ?-Status post EGD and capsule endoscopy on 08/05/2021.  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  Capsule endoscopy showed a few AVMs, nonbleeding.  GI has cleared the patient for discharge on Protonix 40 mg twice a day and to resume Eliquis from 08/07/2021 onwards.  Outpatient follow-up with GI. ?-Will need outpatient follow-up of CBC next week  by PCP. ? ?acute kidney injury ?-Possibly from above.  Resolved with IV hydration.  Creatinine 1.18 today.  We will keep lisinopril on hold till reevaluation by PCP. ? ?Hyponatremia ?-Resolved ?  ?Hyperkalemia ?-Resolved.  Lisinopril on hold. ?  ?Diabetes mellitus type 2 with hyperglycemia ?-Continue home regimen.  Carb modified diet. ?  ?History of PE and DVT ?-On Eliquis as an outpatient.  Eliquis plan as above. ?  ?Hypertension ?-Blood pressure intermittently on the lower side.  Resume amlodipine and metoprolol on discharge. ? ?COPD/centrilobular emphysema ?-Respiratory status currently stable.  Continue home regimen. ? ?Hyperlipidemia ?-Continue statin ? ?Tobacco abuse ?-Counseled regarding cessation ? ?Discharge Instructions ? ?Discharge Instructions   ? ? Diet - low sodium heart healthy   Complete by: As directed ?  ? Diet Carb Modified   Complete by: As directed ?  ? Increase activity slowly   Complete by: As directed ?  ? ?  ? ?Allergies as of 08/06/2021   ? ?   Reactions  ? Latex   ? Other reaction(s): Unknown. Pt denies as of 08/05/21  ? ?  ? ?  ?Medication List  ?  ? ?STOP taking these medications   ? ?clopidogrel 75 MG tablet ?Commonly known as: PLAVIX ?  ?guaiFENesin-dextromethorphan 100-10 MG/5ML syrup ?Commonly known as: ROBITUSSIN DM ?  ?lisinopril 40 MG tablet ?Commonly known as: ZESTRIL ?  ?oxyCODONE-acetaminophen 5-325 MG tablet ?Commonly known as: PERCOCET/ROXICET ?  ? ?  ? ?TAKE these medications   ? ?amLODipine 5 MG tablet ?Commonly known as: NORVASC ?Take 5 mg by mouth daily. ?  ?apixaban 5 MG Tabs tablet ?Commonly known as: ELIQUIS ?Take 1 tablet (5 mg  total) by mouth 2 (two) times daily. ?Start taking on: August 07, 2021 ?What changed: Another medication with the same name was removed. Continue taking this medication, and follow the directions you see here. ?  ?atorvastatin 40 MG tablet ?Commonly known as: LIPITOR ?Take 1 tablet (40 mg total) by mouth daily at 6 PM. ?What changed: when to take  this ?  ?famotidine 20 MG tablet ?Commonly known as: PEPCID ?Take 1 tablet (20 mg total) by mouth 2 (two) times daily as needed for heartburn or indigestion (itching). ?  ?ferrous sulfate 325 (65 FE) MG tablet ?Take 325 mg by mouth 2 (two) times daily with a meal. ?  ?glipiZIDE 10 MG tablet ?Commonly known as: GLUCOTROL ?Take 10 mg by mouth 2 (two) times daily. ?  ?hydrOXYzine 25 MG tablet ?Commonly known as: ATARAX ?Take 25 mg by mouth 3 (three) times daily as needed for itching or anxiety. ?  ?latanoprost 0.005 % ophthalmic solution ?Commonly known as: XALATAN ?Place 1 drop into both eyes at bedtime. ?  ?metoprolol succinate 100 MG 24 hr tablet ?Commonly known as: TOPROL-XL ?Take 100 mg by mouth daily. ?  ?pantoprazole 40 MG tablet ?Commonly known as: PROTONIX ?Take 40 mg by mouth 2 (two) times daily. ?  ?Stiolto Respimat 2.5-2.5 MCG/ACT Aers ?Generic drug: Tiotropium Bromide-Olodaterol ?Inhale 2 puffs into the lungs daily. ?  ?triamcinolone cream 0.1 % ?Commonly known as: KENALOG ?Apply 1 application. topically daily as needed (itching). ?  ? ?  ? ? Follow-up Information   ? ? Sonia Side., FNP. Schedule an appointment as soon as possible for a visit in 1 week(s).   ?Specialty: Family Medicine ?Why: with repeat cbc/bmp ?Contact information: ?[employer] ?[place] [address] ?[phone] ? ? ?  ?  ? ? Otis Brace, MD. Schedule an appointment as soon as possible for a visit in 2 week(s).   ?Specialty: Gastroenterology ?Contact information: ?[address] ?[place] [address] ?[phone] ? ? ?  ?  ? ?  ?  ? ?  ? ?Allergies  ?Allergen Reactions  ? Latex   ?  Other reaction(s): Unknown. Pt denies as of 08/05/21  ? ? ?Consultations: ?GI ? ? ?Procedures/Studies: ?DG Chest 2 View ? ?Result Date: 08/02/2021 ?CLINICAL DATA:  Shortness of breath, dyspnea. EXAM: CHEST - 2 VIEW COMPARISON:  Chest x-ray 05/05/2020. FINDINGS: The heart size and mediastinal contours are within normal limits. Both  lungs are clear. The visualized skeletal structures are unremarkable. IMPRESSION: No active cardiopulmonary disease. Electronically Signed   By: Ronney Asters M.D.   On: 08/02/2021 17:11  ? ?DG Chest Port 1 View ? ?Result Date: 08/03/2021 ?CLINICAL DATA:  Shortness of breath. EXAM: PORTABLE CHEST 1 VIEW COMPARISON:  August 02, 2021. FINDINGS: The heart size and mediastinal contours are within normal limits. Both lungs are clear. The visualized skeletal structures are unremarkable. IMPRESSION: No active disease. Electronically Signed   By: Marijo Conception M.D.   On: 08/03/2021 16:03   ? ?EGD and capsule endoscopy on 08/06/2018 ? ?Subjective: ?Examined at bedside.  Tolerating diet.  No overnight fever, nausea, vomiting, or bloody stools.  Feels okay to go home today. ? ?Discharge Exam: ?Vitals:  ? 08/05/21 2149 08/06/21 0413  ?BP: (!) 162/78 132/72  ?Pulse: 72 83  ?Resp: 18 18  ?Temp: 98.2 ?F (36.8 ?C) 98.3 ?F (36.8 ?C)  ?SpO2: 100% 95%  ? ? ?General: Pt is alert, awake, not in acute distress.  Currently on room  air. ?Cardiovascular: rate controlled, S1/S2 + ?Respiratory: bilateral decreased breath sounds at bases ?Abdominal: Soft, NT, ND, bowel sounds + ?Extremities: Trace lower extremity edema; no cyanosis ? ? ? ?The results of significant diagnostics from this hospitalization (including imaging, microbiology, ancillary and laboratory) are listed below for reference.   ? ? ?Microbiology: ?No results found for this or any previous visit (from the past 240 hour(s)).  ? ?Labs: ?BNP (last 3 results) ?No results for input(s): BNP in the last 8760 hours. ?Basic Metabolic Panel: ?Recent Labs  ?Lab 08/02/21 ?1653 08/03/21 ?1454 08/04/21 ?5993 08/05/21 ?5701 08/06/21 ?7793  ?NA 132* 133* 138 139 137  ?K 4.9 5.4* 4.0 3.7 3.9  ?CL 100 105 109 112* 109  ?CO2 22 20* '22 22 22  '$ ?GLUCOSE 417* 664* 232* 196* 257*  ?BUN 30* 33* '22 9 10  '$ ?CREATININE 1.68* 1.62* 1.13 0.97 1.18  ?CALCIUM 9.7 9.3 9.1 8.8* 8.6*  ?MG  --   --  2.1 2.1 2.0   ? ?Liver Function Tests: ?Recent Labs  ?Lab 08/03/21 ?1454 08/04/21 ?0635  ?AST 15 15  ?ALT 15 13  ?ALKPHOS 91 69  ?BILITOT 0.6 1.6*  ?PROT 6.9 6.6  ?ALBUMIN 4.1 4.2  ? ?No results for input(s): LIPASE, AM

## 2021-08-06 NOTE — Progress Notes (Addendum)
[facility] Progress Note ? ?Autrey Human 71 y.o. October 29, 1950 ? ?CC: Melena, heme positive stools, anemia ? ? ?Subjective: ?Patient seen and examined laying in bed.  He underwent EGD with capsule endoscopy placement yesterday.  He denies any bowel movements today or yesterday.  Denies any nausea, vomiting, abdominal pain.  He is tolerating heart healthy diet well. ? ?ROS : Review of Systems  ?Constitutional:  Negative for chills and fever.  ?Gastrointestinal:  Negative for abdominal pain, blood in stool, constipation, diarrhea, heartburn, melena, nausea and vomiting.  ?Genitourinary:  Negative for dysuria and urgency.  ?Neurological:  Negative for dizziness and headaches.  ? ? ?Objective: ?Vital signs in last 24 hours: ?Vitals:  ? 08/05/21 2149 08/06/21 0413  ?BP: (!) 162/78 132/72  ?Pulse: 72 83  ?Resp: 18 18  ?Temp: 98.2 ?F (36.8 ?C) 98.3 ?F (36.8 ?C)  ?SpO2: 100% 95%  ? ? ?Physical Exam: ? ?General:  Alert, cooperative, no distress, appears stated age  ?Head:  Normocephalic, without obvious abnormality, atraumatic  ?Eyes:  Anicteric sclera, EOM's intact  ?Lungs:   Clear to auscultation bilaterally, respirations unlabored  ?Heart:  Regular rate and rhythm, S1, S2 normal  ?Abdomen:   Soft, non-tender, bowel sounds active all four quadrants,  no masses,   ?Extremities: Extremities normal, atraumatic, no  edema  ?Pulses: 2+ and symmetric  ? ? ?Lab Results: ?Recent Labs  ?  08/05/21 ?0160 08/06/21 ?0323  ?NA 139 137  ?K 3.7 3.9  ?CL 112* 109  ?CO2 22 22  ?GLUCOSE 196* 257*  ?BUN 9 10  ?CREATININE 0.97 1.18  ?CALCIUM 8.8* 8.6*  ?MG 2.1 2.0  ? ?Recent Labs  ?  08/03/21 ?1454 08/04/21 ?0635  ?AST 15 15  ?ALT 15 13  ?ALKPHOS 91 69  ?BILITOT 0.6 1.6*  ?PROT 6.9 6.6  ?ALBUMIN 4.1 4.2  ? ?Recent Labs  ?  08/05/21 ?0824 08/06/21 ?0323  ?WBC 5.5 5.5  ?NEUTROABS 3.4 3.1  ?HGB 8.9* 8.1*  ?HCT 29.3* 26.2*  ?MCV 80.1 80.4  ?PLT 193 169  ? ?Recent Labs  ?  08/03/21 ?1529  ?LABPROT 24.6*  ?INR 2.2*   ? ? ? ? ?Assessment ?Melena, anemia, heme positive stools ?  ?Patient with history of chronic anemia worsening acutely.  Patient has transfused 2 units packed red blood cells on 4/21.  ? ?HGB 8.1(8.9) Platelets 169(193) ?  ?EGD 08/05/2021 ?- Normal esophagus. ?- Non-bleeding gastric ulcer with a clean ulcer base (Forrest Class III). ?- A single non-bleeding angioectasia in the stomach. Treated with argon plasma ?coagulation (APC). ?- Normal examined duodenum. ?- Pill endoscopy placed ? ? ?Plan: ?Switch to Protonix 40 mg BID.  ?Continue Zofran 4 mg every 6 hours as needed for nausea. ?Continue to monitor hemoglobin, will transfuse if below 7. ?We will evaluate for other causes of chronic anemia from the small bowel with pill endoscopy.  ?[facility] will follow.  ? ?Charlott Rakes PA-C ?08/06/2021, 9:16 AM ? ?Contact #  [phone]  ?

## 2021-08-07 LAB — GLUCOSE, CAPILLARY: Glucose-Capillary: 210 mg/dL — ABNORMAL HIGH (ref 70–99)

## 2021-08-15 ENCOUNTER — Ambulatory Visit (HOSPITAL_COMMUNITY): Payer: Medicare Other | Attending: Cardiovascular Disease

## 2021-08-15 DIAGNOSIS — R0600 Dyspnea, unspecified: Secondary | ICD-10-CM | POA: Diagnosis present

## 2021-08-15 LAB — ECHOCARDIOGRAM COMPLETE
Area-P 1/2: 3.08 cm2
S' Lateral: 2.9 cm

## 2021-08-16 ENCOUNTER — Ambulatory Visit: Payer: Medicare Other | Admitting: Adult Health

## 2021-08-16 ENCOUNTER — Encounter: Payer: Self-pay | Admitting: Adult Health

## 2021-08-16 DIAGNOSIS — Z720 Tobacco use: Secondary | ICD-10-CM

## 2021-08-16 DIAGNOSIS — I2699 Other pulmonary embolism without acute cor pulmonale: Secondary | ICD-10-CM

## 2021-08-16 DIAGNOSIS — D649 Anemia, unspecified: Secondary | ICD-10-CM | POA: Diagnosis not present

## 2021-08-16 DIAGNOSIS — J432 Centrilobular emphysema: Secondary | ICD-10-CM | POA: Diagnosis not present

## 2021-08-16 NOTE — Assessment & Plan Note (Signed)
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  No previous history of VTE.  2D echo showed preserved EF.  RV size normal.  Mildly elevated pulmonary artery pressure.  Would consider Eliquis for at least 6 months versus lifelong.  May need hematology referral going forward. ? ?Plan  ?Patient Instructions  ?Continue on  Stiolto 2 puffs daily.  ?Continue on Eliquis '5mg'$  Twice daily , report any bleeding  ?NO NSAIDS-motrin/advil/ibuprofen  ?Keep follow up with GI and PCP .  ?Follow up with Dr. Terrence Dupont as planned.  ?Follow up with Dr. Hermina Staggers or Latrel Szymczak NP in 6-8  weeks and As needed   ?Please contact office for sooner follow up if symptoms do not improve or worsen or seek emergency care  ? ?  ? ?

## 2021-08-16 NOTE — Patient Instructions (Addendum)
Continue on  Stiolto 2 puffs daily.  ?Continue on Eliquis '5mg'$  Twice daily , report any bleeding  ?NO NSAIDS-motrin/advil/ibuprofen  ?Keep follow up with GI and PCP .  ?Follow up with Dr. Terrence Dupont as planned.  ?Follow up with Dr. Hermina Staggers or Ildefonso Keaney NP in 6-8  weeks and As needed   ?Please contact office for sooner follow up if symptoms do not improve or worsen or seek emergency care  ? ?

## 2021-08-16 NOTE — Assessment & Plan Note (Signed)
Recent hospitalization with significant drop in hemoglobin on Eliquis.  Patient was evaluated by GI.  Started on PPI. ?Recent blood work is GI.  Continue follow-up as planned. ?Patient's report any signs of bleeding.  Patient education on Eliquis.  Avoid all nonsteroidals ? ?Plan  ?Patient Instructions  ?Continue on  Stiolto 2 puffs daily.  ?Continue on Eliquis '5mg'$  Twice daily , report any bleeding  ?NO NSAIDS-motrin/advil/ibuprofen  ?Keep follow up with GI and PCP .  ?Follow up with Dr. Terrence Dupont as planned.  ?Follow up with Dr. Hermina Staggers or Melton Walls NP in 6-8  weeks and As needed   ?Please contact office for sooner follow up if symptoms do not improve or worsen or seek emergency care  ? ?  ? ? ?

## 2021-08-16 NOTE — Assessment & Plan Note (Signed)
COPD with emphysema stable on current regimen.  Continue on Stiolto. ? ?Plan  ?Patient Instructions  ?Continue on  Stiolto 2 puffs daily.  ?Continue on Eliquis '5mg'$  Twice daily , report any bleeding  ?NO NSAIDS-motrin/advil/ibuprofen  ?Keep follow up with GI and PCP .  ?Follow up with Dr. Terrence Dupont as planned.  ?Follow up with Dr. Hermina Staggers or Japneet Staggs NP in 6-8  weeks and As needed   ?Please contact office for sooner follow up if symptoms do not improve or worsen or seek emergency care  ? ?  ? ?

## 2021-08-16 NOTE — Progress Notes (Signed)
? ?'@Patient'$  ID: Elijah Sandoval, male    DOB: [birth year], 71 y.o.   MRN: [id number] ? ?Chief Complaint  ?Patient presents with  ? Follow-up  ? ? ?Referring provider: ?Sonia Side., FNP ? ?HPI: ?70 year old male smoker seen for pulmonary consult November 2021 for emphysema and lung nodule. ?Extensive left DVT February 2023 and PE-probable unprovoked (dx [facility] )  ? ? ?TEST/EVENTS :  ?CT chest December 15, 2020 showed resolved bilateral opacities most likely related to previous COVID-pneumonia, stable emphysema, stable bilateral pulmonary nodules measuring 5 mm max, stable prominent mediastinal and axillary lymph nodes ?  ?PFTs November 15, 2020 showed mild to moderate airflow obstruction with FEV1 at 67, ratio 70, FVC 73%, DLCO 36%. ? ?extensive left DVT February 2023. He developed acute leg pain.  He was set up for venous Dopplers that showed extensive occlusive thrombus in the deep veins of the left lower extremity involving the common femoral vein, femoral vein, popliteal and visualized calf veins.  Much of the thrombus is partially echogenic which would support some degree of chronicity. CT chest showed several left sided pulmonary emboli with small burden. No heart strain.  ? ?08/16/2021 Follow up : COPD with emphysema, PE, post hospital follow-up ?Patient returns for a 2-week follow-up.  Patient was seen last visit with progressive shortness of breath.  Patient had been diagnosed with an extensive left DVT and subsequent PE February 2023.  He was started on Eliquis.  Last visit he had significant shortness of breath with minimum activity and decreased activity tolerance.  Lab work revealed that he had significant symptomatic anemia with a hemoglobin at 6.5 .  Patient was referred to the emergency room for hospitalization.  He was transfused 2 units of packed red blood cells.  GI consulted with endoscopy and capsule endoscopy.  Treated with IV Protonix and discharged on oral Protonix twice daily.  Patient  was found to have single nonbleeding angiectasia in the stomach.  Capsule endoscopy revealed a few AVMs nonbleeding.  Patient was restarted on Eliquis.  Patient says he is feeling much better since discharge.  Energy level is slowly starting to rebuild.  His shortness of breath is decreased.  Patient has been seen by GI and had recent lab work.  Last visit patient was started on Stiolto.  Patient says he does feel that this may have helped his breathing as well.  He denies any increased cough or congestion.  2D echo showed preserved EF at 60-65%.,  RV size normal.  Mildly elevated pulmonary artery systolic pressure. ?Patient denies any known bleeding.  Denies chest pain orthopnea PND or increased leg swelling ? ? ? ? ? ?Allergies  ?Allergen Reactions  ? Latex   ?  Other reaction(s): Unknown. Pt denies as of 08/05/21  ? ? ?Immunization History  ?Administered Date(s) Administered  ? Fluad Quad(high Dose 65+) 01/12/2021  ? Influenza-Unspecified 12/27/2019  ? Moderna Sars-Covid-2 Vaccination 06/18/2019, 07/16/2019  ? ? ?Past Medical History:  ?Diagnosis Date  ? Diabetes mellitus   ? Stroke [facility])   ? Pt reports TIA in 2013  ? Transfusion history   ? ? ?Tobacco History: ?Social History  ? ?Tobacco Use  ?Smoking Status Every Day  ? Packs/day: 0.50  ? Years: 15.00  ? Pack years: 7.50  ? Types: Cigarettes  ?Smokeless Tobacco Never  ?Tobacco Comments  ? Smoking a few a day.  08/02/2021 hfb  ? ?Ready to quit: Not Answered ?Counseling given: Not Answered ?Tobacco comments: Smoking a few  a day.  08/02/2021 hfb ? ? ?Outpatient Medications Prior to Visit  ?Medication Sig Dispense Refill  ? amLODipine (NORVASC) 5 MG tablet Take 5 mg by mouth daily.    ? apixaban (ELIQUIS) 5 MG TABS tablet Take 1 tablet (5 mg total) by mouth 2 (two) times daily.    ? atorvastatin (LIPITOR) 40 MG tablet Take 1 tablet (40 mg total) by mouth daily at 6 PM. (Patient taking differently: Take 40 mg by mouth daily.) 30 tablet 0  ? famotidine (PEPCID) 20 MG  tablet Take 1 tablet (20 mg total) by mouth 2 (two) times daily as needed for heartburn or indigestion (itching).    ? ferrous sulfate 325 (65 FE) MG tablet Take 325 mg by mouth 2 (two) times daily with a meal.    ? glipiZIDE (GLUCOTROL) 10 MG tablet Take 10 mg by mouth 2 (two) times daily.    ? hydrOXYzine (ATARAX) 25 MG tablet Take 25 mg by mouth 3 (three) times daily as needed for itching or anxiety.    ? latanoprost (XALATAN) 0.005 % ophthalmic solution Place 1 drop into both eyes at bedtime.    ? metoprolol succinate (TOPROL-XL) 100 MG 24 hr tablet Take 100 mg by mouth daily.    ? pantoprazole (PROTONIX) 40 MG tablet Take 40 mg by mouth 2 (two) times daily.    ? Tiotropium Bromide-Olodaterol (STIOLTO RESPIMAT) 2.5-2.5 MCG/ACT AERS Inhale 2 puffs into the lungs daily. 4 g 0  ? triamcinolone cream (KENALOG) 0.1 % Apply 1 application. topically daily as needed (itching).    ? ?No facility-administered medications prior to visit.  ? ? ? ?Review of Systems:  ? ?Constitutional:   No  weight loss, night sweats,  Fevers, chills,  ?+fatigue, or  lassitude. ? ?HEENT:   No headaches,  Difficulty swallowing,  Tooth/dental problems, or  Sore throat,  ?              No sneezing, itching, ear ache, nasal congestion, post nasal drip,  ? ?CV:  No chest pain,  Orthopnea, PND, swelling in lower extremities, anasarca, dizziness, palpitations, syncope.  ? ?GI  No heartburn, indigestion, abdominal pain, nausea, vomiting, diarrhea, change in bowel habits, loss of appetite, bloody stools.  ? ?Resp:   No excess mucus, no productive cough,  No non-productive cough,  No coughing up of blood.  No change in color of mucus.  No wheezing.  No chest wall deformity ? ?Skin: no rash or lesions. ? ?GU: no dysuria, change in color of urine, no urgency or frequency.  No flank pain, no hematuria  ? ?MS:  No joint pain or swelling.  No decreased range of motion.  No back pain. ? ? ? ?Physical Exam ? ?BP (!) 110/56 (BP Location: Left Arm, Patient  Position: Sitting, Cuff Size: Normal)   Pulse 80   Temp (!) 97.5 ?F (36.4 ?C) (Oral)   Ht '6\' 3"'$  (1.905 m)   Wt 177 lb 3.2 oz (80.4 kg)   SpO2 98%   BMI 22.15 kg/m?  ? ?GEN: A/Ox3; pleasant , NAD, well nourished  ?  ?HEENT:  Anton/AT,   NOSE-clear, THROAT-clear, no lesions, no postnasal drip or exudate noted.  ? ?NECK:  Supple w/ fair ROM; no JVD; normal carotid impulses w/o bruits; no thyromegaly or nodules palpated; no lymphadenopathy.   ? ?RESP  Clear  P & A; w/o, wheezes/ rales/ or rhonchi. no accessory muscle use, no dullness to percussion ? ?CARD:  RRR, no m/r/g, no  peripheral edema, pulses intact, no cyanosis or clubbing. ? ?GI:   Soft & nt; nml bowel sounds; no organomegaly or masses detected.  ? ?Musco: Warm bil, no deformities or joint swelling noted.  ? ?Neuro: alert, no focal deficits noted.   ? ?Skin: Warm, no lesions or rashes ? ? ? ?Lab Results: ? ?CBC ?   ?Component Value Date/Time  ? WBC 5.5 08/06/2021 0323  ? RBC 3.26 (L) 08/06/2021 0323  ? HGB 8.1 (L) 08/06/2021 0323  ? HCT 26.2 (L) 08/06/2021 0323  ? PLT 169 08/06/2021 0323  ? MCV 80.4 08/06/2021 0323  ? MCH 24.8 (L) 08/06/2021 0323  ? MCHC 30.9 08/06/2021 0323  ? RDW 19.9 (H) 08/06/2021 0323  ? LYMPHSABS 1.4 08/06/2021 0323  ? MONOABS 0.6 08/06/2021 0323  ? EOSABS 0.3 08/06/2021 0323  ? BASOSABS 0.0 08/06/2021 0323  ? ? ?BMET ?   ?Component Value Date/Time  ? NA 137 08/06/2021 0323  ? K 3.9 08/06/2021 0323  ? CL 109 08/06/2021 0323  ? CO2 22 08/06/2021 0323  ? GLUCOSE 257 (H) 08/06/2021 0323  ? BUN 10 08/06/2021 0323  ? CREATININE 1.18 08/06/2021 0323  ? CALCIUM 8.6 (L) 08/06/2021 0323  ? GFRNONAA >60 08/06/2021 0323  ? GFRAA >60 02/25/2019 1059  ? ? ?BNP ?No results found for: BNP ? ?ProBNP ?   ?Component Value Date/Time  ? PROBNP 48.0 08/02/2021 1653  ? ? ?Imaging: ?DG Chest 2 View ? ?Result Date: 08/02/2021 ?CLINICAL DATA:  Shortness of breath, dyspnea. EXAM: CHEST - 2 VIEW COMPARISON:  Chest x-ray 05/05/2020. FINDINGS: The heart size and  mediastinal contours are within normal limits. Both lungs are clear. The visualized skeletal structures are unremarkable. IMPRESSION: No active cardiopulmonary disease. Electronically Signed   By: Tina Griffiths.D.

## 2021-08-16 NOTE — Assessment & Plan Note (Signed)
Smoking cessation  

## 2021-09-05 ENCOUNTER — Telehealth: Payer: Self-pay | Admitting: *Deleted

## 2021-09-05 MED ORDER — STIOLTO RESPIMAT 2.5-2.5 MCG/ACT IN AERS: 2.0000 | INHALATION_SPRAY | Freq: Every day | RESPIRATORY_TRACT | 6 refills | Status: AC

## 2021-09-05 NOTE — Telephone Encounter (Signed)
Patient came in to get a sample of Stiolto, he states it will no longer turn and cannot get his medication out of the sample.  Advised that once the line gets to the red it is empty and will no longer turn.   Asked if patient had gotten a prescription filled at his pharmacy.  He stated he just had the sample.  I let him know I would make sure he had a script sent to his pharmacy (verified his pharmacy) and if it was very expensive to call his insurance and ask what is on the preferred list and then call us with the list.  I provided one sample to him and had him demonstrate assembly of the inhaler.  He verbalized understanding.  Nothing further needed.  Patient seen in the office today and instructed on use of [place].  Patient expressed understanding and demonstrated technique.

## 2021-09-06 ENCOUNTER — Other Ambulatory Visit: Payer: Self-pay | Admitting: Vascular Surgery

## 2021-09-13 ENCOUNTER — Telehealth: Payer: Self-pay | Admitting: Adult Health

## 2021-09-13 NOTE — Telephone Encounter (Signed)
Patient is checking on message.Patient phone number is [phone].

## 2021-09-14 NOTE — Telephone Encounter (Signed)
Called and spoke with patient. Advised patient that TP wanted him to continue the eliquis per office note from his recent visit. Patient verbalized understanding.   Nothing further needed.

## 2021-10-12 ENCOUNTER — Other Ambulatory Visit: Payer: Self-pay | Admitting: Gastroenterology

## 2021-10-12 ENCOUNTER — Ambulatory Visit
Admission: RE | Admit: 2021-10-12 | Discharge: 2021-10-12 | Disposition: A | Discharge status: Home / Self Care | Payer: Medicare Other | Source: Ambulatory Visit | Attending: Gastroenterology | Admitting: Gastroenterology

## 2021-10-12 DIAGNOSIS — R152 Fecal urgency: Secondary | ICD-10-CM

## 2021-10-12 DIAGNOSIS — R197 Diarrhea, unspecified: Secondary | ICD-10-CM

## 2021-10-17 ENCOUNTER — Telehealth: Payer: Self-pay | Admitting: Adult Health

## 2021-10-17 NOTE — Telephone Encounter (Signed)
Spoke with pt who states he has been having episodes of loose stools for over 1 year now. Pt denies any other symptoms and states that his PCP has started so type of medication but it isn't working. Pt wants to know if Tammy can give him something more effective. Pt was encouraged by RN to f/u with PCP and notify them that the medication is not working. Tammy please advise.

## 2021-10-22 NOTE — Telephone Encounter (Signed)
Sorry to hear this but he will need to follow-up with his primary care provider for diarrhea.  Most likely will need further evaluation and possible referral to GI  Please contact office for sooner follow up if symptoms do not improve or worsen or seek emergency care

## 2021-10-22 NOTE — Telephone Encounter (Signed)
Called and spoke with patient to let him know the recs from [facility]. He expressed understanding and said he would call his GI doctor to get an appt. Nothing further needed at this time.

## 2021-11-07 ENCOUNTER — Telehealth: Payer: Self-pay | Admitting: Adult Health

## 2021-11-08 NOTE — Telephone Encounter (Signed)
Called patient and he states that he was wondering how long he needs to remain on his Eliquis.   Please advise

## 2021-11-12 NOTE — Telephone Encounter (Signed)
Called patient and went over that he is to remain on Eliquis. And that he needs a follow up with TP or Dr Jenetta Downer. Patient states he will call back to make follow up. Nothing further needed

## 2021-11-12 NOTE — Telephone Encounter (Signed)
Yes he is , recent PE this year.  Suppose to be having routine labs since he had sign. Anemia  He was suppose to follow up in June or early July but did not  Needs ov with dr Jenetta Downer or me

## 2021-11-26 ENCOUNTER — Telehealth: Payer: Self-pay | Admitting: Internal Medicine

## 2021-11-26 NOTE — Telephone Encounter (Signed)
Scheduled appt per 8/14 referral. Pt is aware of appt date and time. Pt is aware to arrive 15 mins prior to appt time and to bring and updated insurance card. Pt is aware of appt location.   

## 2021-12-14 ENCOUNTER — Telehealth: Payer: Self-pay | Admitting: Hematology

## 2021-12-14 NOTE — Telephone Encounter (Signed)
R/s pt's new hem appt. Pt is aware of new appt date/time.  

## 2021-12-18 ENCOUNTER — Inpatient Hospital Stay: Payer: Medicare Other | Admitting: Internal Medicine

## 2021-12-18 ENCOUNTER — Inpatient Hospital Stay: Payer: Medicare Other

## 2021-12-20 NOTE — Progress Notes (Signed)
[place]   Telephone:(336) [phone] Fax:(336) [place] consult Note   Patient Care Team: Sonia Side., FNP as PCP - General (Family Medicine) Otis Brace, MD as Consulting Physician (Gastroenterology) Truitt Merle, MD as Consulting Physician (Hematology) Laurin Coder, MD as Consulting Physician (Pulmonary Disease) Marty Heck, MD as Consulting Physician (Vascular Surgery) 12/21/2021  CHIEF COMPLAINTS/PURPOSE OF CONSULTATION:  Iron Deficient Anemia  ASSESSMENT & PLAN:  Elijah Sandoval is a 71 y.o. male with   1. Microcytic anemia, Iron Deficiency -he has h/o anemia since at least 02/2019, when he was hospitalized and received IV Feraheme. His Hg has been in 6-9 g/dl range in the past year and again required blood transfusion in 07/2021 -most recent labs from 11/21/21 showed serum iron 13, ferritin 4.2, hgb 8. -most recent colonoscopy 02/2021, EGD 08/05/21, and capsule endoscopy 08/13/21. 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 --we discussed the possible etiology for his anemia, including iron deficiency and anemia of chronic disease. He has been on Eliquis since February 2023 for PE. He likely has occlude GI bleeding  -He has been on oral iron for several months, still has moderate anemia.  I recommend IV iron infusions. I explained the benefit and potential side effects, especially allergy reaction, including anaphylactic reaction, he agrees to proceed. -will obtain new baseline labs today and schedule infusions for him. -If his anemia does not resolve after adequate iron replacement, then I will obtain additional anemia work-up  2. Tobacco Use, COPD -h/o smoking since age 79, currently 0.5 ppd -he has a chronic cough   PLAN: -lab today -will schedule IV iron infusions, iron products per his insurance preference    HISTORY OF PRESENTING ILLNESS:  Elijah Sandoval 71 y.o. male is here because of iron deficiency  anemia. He is accompanied by his wife.  He has had anemia since at least 02/2019, at which time he was hospitalized and given IV Feraheme.  He has history of COPD. He endorses smoking 0.5 ppd now, previously smoked more and started at around age 22. They report he has a cough. He had not noticed any recent bleeding such as epistaxis, hematuria or hematochezia He is on antiplatelets agents with Eliquis for history of DVT in 05/2021. His last colonoscopy was 11/29/20 by Dr. Alessandra Bevels. Repeat due 2027. He had no prior history or diagnosis of cancer. His age appropriate screening programs are up-to-date. He denies any pica and eats a variety of diet. He has received blood transfusion, once last year and once this year, per his report. The patient was prescribed oral iron supplements, and he takes ferrous sulfate '325mg'$  BID.  H/o stroke in 2013 No family history of anemia or blood clots Sister with h/o GI cancer (stomach area?) Retired from [employer], self-employed   REVIEW OF SYSTEMS:   Constitutional: Denies fevers, chills or abnormal night sweats Eyes: Denies blurriness of vision, double vision or watery eyes Ears, nose, mouth, throat, and face: Denies mucositis or sore throat Respiratory: Denies cough, dyspnea or wheezes Cardiovascular: Denies palpitation, chest discomfort or lower extremity swelling Gastrointestinal:  Denies nausea, heartburn or change in bowel habits Skin: Denies abnormal skin rashes Lymphatics: Denies new lymphadenopathy or easy bruising Neurological:Denies numbness, tingling or new weaknesses Behavioral/Psych: Mood is stable, no new changes   All other systems were reviewed with the patient and are negative.   MEDICAL HISTORY:  Past Medical History:  Diagnosis Date   COPD (chronic obstructive pulmonary disease) (  [place])    Diabetes mellitus    Stroke [facility])    Pt reports TIA in 2013   Transfusion history     SURGICAL HISTORY: Past Surgical History:   Procedure Laterality Date   ESOPHAGOGASTRODUODENOSCOPY (EGD) WITH PROPOFOL N/A 08/05/2021   Procedure: ESOPHAGOGASTRODUODENOSCOPY (EGD) WITH PROPOFOL;  Surgeon: Ronnette Juniper, MD;  Location: [facility] ENDOSCOPY;  Service: Gastroenterology;  Laterality: N/A;   GIVENS CAPSULE STUDY N/A 08/05/2021   Procedure: GIVENS CAPSULE STUDY;  Surgeon: Ronnette Juniper, MD;  Location: [facility] ENDOS[facility];  Service: Gastroenterology;  Laterality: N/A;   HOT HEMOSTASIS N/A 08/05/2021   Procedure: HOT HEMOSTASIS (ARGON PLASMA COAGULATION/BICAP);  Surgeon: Ronnette Juniper, MD;  Location: [facility];  Service: Gastroenterology;  Laterality: N/A;   NO PAST SURGERIES      SOCIAL HISTORY: Social History   Socioeconomic History   Marital status: Married    Spouse name: [name]   Number of children: 3   Years of education: HS   Highest education level: Not on file  Occupational History   Occupation: self employed    Employer: [employer]  Tobacco Use   Smoking status: Every Day    Packs/day: 0.50    Years: 50.00    Total pack years: 25.00    Types: Cigarettes   Smokeless tobacco: Never   Tobacco comments:    Smoking a few a day.  08/02/2021 hfb  Vaping Use   Vaping Use: Never used  Substance and Sexual Activity   Alcohol use: No   Drug use: No   Sexual activity: Not on file  Other Topics Concern   Not on file  Social History Narrative   Patient lives at home with spouse.   Caffeine Use: Coffee on weekends   Social Determinants of Health   Financial Resource Strain: Not on file  Food Insecurity: Not on file  Transportation Needs: Not on file  Physical Activity: Not on file  Stress: Not on file  Social Connections: Not on file  Intimate Partner Violence: Not on file    FAMILY HISTORY: Family History  Problem Relation Age of Onset   Cancer Sister    Cancer Sister     ALLERGIES:  is allergic to latex.  MEDICATIONS:  Current Outpatient Medications  Medication Sig Dispense Refill   amLODipine  (NORVASC) 5 MG tablet Take 5 mg by mouth daily.     atorvastatin (LIPITOR) 40 MG tablet Take 1 tablet (40 mg total) by mouth daily at 6 PM. (Patient taking differently: Take 40 mg by mouth daily.) 30 tablet 0   ELIQUIS 5 MG TABS tablet TAKE 1 TABLET BY MOUTH TWICE A DAY 60 tablet 3   famotidine (PEPCID) 20 MG tablet Take 1 tablet (20 mg total) by mouth 2 (two) times daily as needed for heartburn or indigestion (itching).     ferrous sulfate 325 (65 FE) MG tablet Take 325 mg by mouth 2 (two) times daily with a meal.     glipiZIDE (GLUCOTROL) 10 MG tablet Take 10 mg by mouth 2 (two) times daily.     hydrOXYzine (ATARAX) 25 MG tablet Take 25 mg by mouth 3 (three) times daily as needed for itching or anxiety.     latanoprost (XALATAN) 0.005 % ophthalmic solution Place 1 drop into both eyes at bedtime.     metoprolol succinate (TOPROL-XL) 100 MG 24 hr tablet Take 100 mg by mouth daily.     pantoprazole (PROTONIX) 40 MG tablet Take 40 mg by mouth 2 (two) times  daily.     Tiotropium Bromide-Olodaterol (STIOLTO RESPIMAT) 2.5-2.5 MCG/ACT AERS Inhale 2 puffs into the lungs daily. 4 g 0   Tiotropium Bromide-Olodaterol (STIOLTO RESPIMAT) 2.5-2.5 MCG/ACT AERS Inhale 2 puffs into the lungs daily. 4 g 6   triamcinolone cream (KENALOG) 0.1 % Apply 1 application. topically daily as needed (itching).     No current facility-administered medications for this visit.    PHYSICAL EXAMINATION: ECOG PERFORMANCE STATUS: 1 - Symptomatic but completely ambulatory  Vitals:   12/21/21 1207  BP: (!) 158/75  Pulse: 67  Resp: 14  Temp: 98.1 F (36.7 C)  SpO2: 98%   Filed Weights   12/21/21 1207  Weight: 179 lb 11.2 oz (81.5 kg)    GENERAL:alert, no distress and comfortable SKIN: skin color, texture, turgor are normal, no rashes or significant lesions EYES: normal, conjunctiva are pink and non-injected, sclera clear OROPHARYNX:no exudate, no erythema and lips, buccal mucosa, and tongue normal  NECK: supple,  thyroid normal size, non-tender, without nodularity LYMPH:  no palpable lymphadenopathy in the cervical, axillary or inguinal LUNGS: clear to auscultation and percussion with normal breathing effort HEART: regular rate & rhythm and no murmurs and no lower extremity edema ABDOMEN:abdomen soft, non-tender and normal bowel sounds Musculoskeletal:no cyanosis of digits and no clubbing  PSYCH: alert & oriented x 3 with fluent speech NEURO: no focal motor/sensory deficits  LABORATORY DATA:  I have reviewed the data as listed    Latest Ref Rng & Units 12/21/2021    1:08 PM 08/06/2021    3:23 AM 08/05/2021    8:24 AM  CBC  WBC 4.0 - 10.5 K/uL 6.4  5.5  5.5   Hemoglobin 13.0 - 17.0 g/dL 8.2  8.1  8.9   Hematocrit 39.0 - 52.0 % 28.3  26.2  29.3   Platelets 150 - 400 K/uL 295  169  193        Latest Ref Rng & Units 08/06/2021    3:23 AM 08/05/2021    8:24 AM 08/04/2021    6:35 AM  CMP  Glucose 70 - 99 mg/dL 257  196  232   BUN 8 - 23 mg/dL '10  9  22   '$ Creatinine 0.61 - 1.24 mg/dL 1.18  0.97  1.13   Sodium 135 - 145 mmol/L 137  139  138   Potassium 3.5 - 5.1 mmol/L 3.9  3.7  4.0   Chloride 98 - 111 mmol/L 109  112  109   CO2 22 - 32 mmol/L '22  22  22   '$ Calcium 8.9 - 10.3 mg/dL 8.6  8.8  9.1   Total Protein 6.5 - 8.1 g/dL   6.6   Total Bilirubin 0.3 - 1.2 mg/dL   1.6   Alkaline Phos 38 - 126 U/L   69   AST 15 - 41 U/L   15   ALT 0 - 44 U/L   13      RADIOGRAPHIC STUDIES: I have personally reviewed the radiological images as listed and agreed with the findings in the report. No results found.  Orders Placed This Encounter  Procedures   Ferritin    Standing Status:   Standing    Number of Occurrences:   20    Standing Expiration Date:   12/22/2022   CBC with Differential/Platelet    Standing Status:   Standing    Number of Occurrences:   50    Standing Expiration Date:   12/22/2022   Vitamin B12  Standing Status:   Future    Number of Occurrences:   1    Standing Expiration Date:    12/22/2022   Retic Panel    Standing Status:   Future    Number of Occurrences:   1    Standing Expiration Date:   12/22/2022    All questions were answered. The patient knows to call the clinic with any problems, questions or concerns. The total time spent in the appointment was 40 minutes.     Truitt Merle, MD 12/21/2021   I, Wilburn Mylar, am acting as scribe for Truitt Merle, MD.   I have reviewed the above documentation for accuracy and completeness, and I agree with the above.

## 2021-12-21 ENCOUNTER — Encounter: Payer: Self-pay | Admitting: Hematology

## 2021-12-21 ENCOUNTER — Inpatient Hospital Stay: Payer: Medicare Other | Attending: Internal Medicine | Admitting: Hematology

## 2021-12-21 ENCOUNTER — Inpatient Hospital Stay: Payer: Medicare Other

## 2021-12-21 ENCOUNTER — Other Ambulatory Visit: Payer: Self-pay

## 2021-12-21 VITALS — BP 158/75 | HR 67 | Temp 98.1°F | Resp 14 | Wt 179.7 lb

## 2021-12-21 DIAGNOSIS — D500 Iron deficiency anemia secondary to blood loss (chronic): Secondary | ICD-10-CM

## 2021-12-21 DIAGNOSIS — F17210 Nicotine dependence, cigarettes, uncomplicated: Secondary | ICD-10-CM | POA: Insufficient documentation

## 2021-12-21 DIAGNOSIS — D509 Iron deficiency anemia, unspecified: Secondary | ICD-10-CM | POA: Diagnosis present

## 2021-12-21 DIAGNOSIS — J449 Chronic obstructive pulmonary disease, unspecified: Secondary | ICD-10-CM | POA: Insufficient documentation

## 2021-12-21 DIAGNOSIS — D649 Anemia, unspecified: Secondary | ICD-10-CM

## 2021-12-21 LAB — RETIC PANEL
Immature Retic Fract: 25.6 % — ABNORMAL HIGH (ref 2.3–15.9)
RBC.: 4.01 MIL/uL — ABNORMAL LOW (ref 4.22–5.81)
Retic Count, Absolute: 45.7 10*3/uL (ref 19.0–186.0)
Retic Ct Pct: 1.1 % (ref 0.4–3.1)
Reticulocyte Hemoglobin: 17.5 pg — ABNORMAL LOW (ref >27.9)

## 2021-12-21 LAB — CBC WITH DIFFERENTIAL/PLATELET
Abs Immature Granulocytes: 0.03 10*3/uL (ref 0.00–0.07)
Basophils Absolute: 0 10*3/uL (ref 0.0–0.1)
Basophils Relative: 1 %
Eosinophils Absolute: 0.2 10*3/uL (ref 0.0–0.5)
Eosinophils Relative: 3 %
HCT: 28.3 % — ABNORMAL LOW (ref 39.0–52.0)
Hemoglobin: 8.2 g/dL — ABNORMAL LOW (ref 13.0–17.0)
Immature Granulocytes: 1 %
Lymphocytes Relative: 26 %
Lymphs Abs: 1.7 10*3/uL (ref 0.7–4.0)
MCH: 20.3 pg — ABNORMAL LOW (ref 26.0–34.0)
MCHC: 29 g/dL — ABNORMAL LOW (ref 30.0–36.0)
MCV: 70 fL — ABNORMAL LOW (ref 80.0–100.0)
Monocytes Absolute: 0.7 10*3/uL (ref 0.1–1.0)
Monocytes Relative: 10 %
Neutro Abs: 3.8 10*3/uL (ref 1.7–7.7)
Neutrophils Relative %: 59 %
Platelets: 295 10*3/uL (ref 150–400)
RBC: 4.04 MIL/uL — ABNORMAL LOW (ref 4.22–5.81)
RDW: 19.2 % — ABNORMAL HIGH (ref 11.5–15.5)
WBC: 6.4 10*3/uL (ref 4.0–10.5)
nRBC: 0 % (ref 0.0–0.2)

## 2021-12-21 LAB — IRON AND IRON BINDING CAPACITY (CC-WL,HP ONLY)
Iron: 11 ug/dL — ABNORMAL LOW (ref 45–182)
Saturation Ratios: 2 % — ABNORMAL LOW (ref 17.9–39.5)
TIBC: 511 ug/dL — ABNORMAL HIGH (ref 250–450)
UIBC: 500 ug/dL — ABNORMAL HIGH (ref 117–376)

## 2021-12-21 LAB — FERRITIN: Ferritin: 3 ng/mL — ABNORMAL LOW (ref 24–336)

## 2021-12-21 LAB — VITAMIN B12: Vitamin B-12: 421 pg/mL (ref 180–914)

## 2021-12-24 ENCOUNTER — Telehealth: Payer: Self-pay

## 2021-12-24 ENCOUNTER — Telehealth: Payer: Self-pay | Admitting: Hematology

## 2021-12-24 NOTE — Telephone Encounter (Signed)
This nurse received a call from this patient stating that he would like to know what his HGB level is from the labs draw on on Friday 9/8.  This nurse advised that he level is 8.2.  Patient acknowledged understanding and verified infusion appointment for Monday 9/25.  No further questions or concerns at this time.

## 2021-12-24 NOTE — Telephone Encounter (Signed)
Scheduled per 9/8 in basket, pt has been called and confirmed

## 2021-12-31 ENCOUNTER — Inpatient Hospital Stay: Payer: Medicare Other

## 2021-12-31 ENCOUNTER — Other Ambulatory Visit: Payer: Self-pay

## 2021-12-31 VITALS — BP 142/65 | HR 62 | Temp 97.7°F | Resp 17

## 2021-12-31 DIAGNOSIS — D500 Iron deficiency anemia secondary to blood loss (chronic): Secondary | ICD-10-CM

## 2021-12-31 DIAGNOSIS — D509 Iron deficiency anemia, unspecified: Secondary | ICD-10-CM | POA: Diagnosis not present

## 2021-12-31 MED ORDER — LORATADINE 10 MG PO TABS
10.0000 mg | ORAL_TABLET | Freq: Once | ORAL | Status: AC
  Administered 2021-12-31: 10 mg via ORAL
  Filled 2021-12-31: qty 1

## 2021-12-31 MED ORDER — SODIUM CHLORIDE 0.9 % IV SOLN: Freq: Once | INTRAVENOUS | Status: AC

## 2021-12-31 MED ORDER — SODIUM CHLORIDE 0.9 % IV SOLN
400.0000 mg | Freq: Once | INTRAVENOUS | Status: AC
  Administered 2021-12-31: 400 mg via INTRAVENOUS
  Filled 2021-12-31: qty 20

## 2021-12-31 NOTE — Patient Instructions (Signed)

## 2022-01-07 ENCOUNTER — Inpatient Hospital Stay: Payer: Medicare Other

## 2022-01-07 ENCOUNTER — Other Ambulatory Visit: Payer: Self-pay

## 2022-01-07 VITALS — BP 133/68 | HR 61 | Temp 98.2°F | Resp 17

## 2022-01-07 DIAGNOSIS — D500 Iron deficiency anemia secondary to blood loss (chronic): Secondary | ICD-10-CM

## 2022-01-07 DIAGNOSIS — D509 Iron deficiency anemia, unspecified: Secondary | ICD-10-CM | POA: Diagnosis not present

## 2022-01-07 LAB — CBC WITH DIFFERENTIAL/PLATELET
Abs Immature Granulocytes: 0.03 10*3/uL (ref 0.00–0.07)
Basophils Absolute: 0 10*3/uL (ref 0.0–0.1)
Basophils Relative: 0 %
Eosinophils Absolute: 0.3 10*3/uL (ref 0.0–0.5)
Eosinophils Relative: 5 %
HCT: 27.3 % — ABNORMAL LOW (ref 39.0–52.0)
Hemoglobin: 8 g/dL — ABNORMAL LOW (ref 13.0–17.0)
Immature Granulocytes: 1 %
Lymphocytes Relative: 26 %
Lymphs Abs: 1.6 10*3/uL (ref 0.7–4.0)
MCH: 20.9 pg — ABNORMAL LOW (ref 26.0–34.0)
MCHC: 29.3 g/dL — ABNORMAL LOW (ref 30.0–36.0)
MCV: 71.5 fL — ABNORMAL LOW (ref 80.0–100.0)
Monocytes Absolute: 0.7 10*3/uL (ref 0.1–1.0)
Monocytes Relative: 11 %
Neutro Abs: 3.5 10*3/uL (ref 1.7–7.7)
Neutrophils Relative %: 57 %
Platelets: 189 10*3/uL (ref 150–400)
RBC: 3.82 MIL/uL — ABNORMAL LOW (ref 4.22–5.81)
RDW: 23.4 % — ABNORMAL HIGH (ref 11.5–15.5)
WBC: 6 10*3/uL (ref 4.0–10.5)
nRBC: 0 % (ref 0.0–0.2)

## 2022-01-07 MED ORDER — LORATADINE 10 MG PO TABS
10.0000 mg | ORAL_TABLET | Freq: Every day | ORAL | Status: DC
  Administered 2022-01-07: 10 mg via ORAL
  Filled 2022-01-07: qty 1

## 2022-01-07 MED ORDER — SODIUM CHLORIDE 0.9 % IV SOLN
400.0000 mg | Freq: Once | INTRAVENOUS | Status: AC
  Administered 2022-01-07: 400 mg via INTRAVENOUS
  Filled 2022-01-07: qty 20

## 2022-01-07 MED ORDER — SODIUM CHLORIDE 0.9 % IV SOLN: Freq: Once | INTRAVENOUS | Status: AC

## 2022-01-07 NOTE — Progress Notes (Signed)
Pt tolerated IV iron infusion well. Declined to stay for 30 minute post observation period. VSS, ambulatory to lobby.  

## 2022-01-07 NOTE — Patient Instructions (Signed)

## 2022-01-10 ENCOUNTER — Other Ambulatory Visit: Payer: Self-pay | Admitting: Vascular Surgery

## 2022-01-14 ENCOUNTER — Inpatient Hospital Stay: Payer: Medicare Other

## 2022-01-21 ENCOUNTER — Other Ambulatory Visit: Payer: Self-pay

## 2022-01-21 ENCOUNTER — Inpatient Hospital Stay: Payer: Medicare Other | Attending: Internal Medicine

## 2022-01-21 VITALS — BP 149/67 | HR 61 | Temp 98.3°F | Resp 17

## 2022-01-21 DIAGNOSIS — D509 Iron deficiency anemia, unspecified: Secondary | ICD-10-CM | POA: Insufficient documentation

## 2022-01-21 DIAGNOSIS — D500 Iron deficiency anemia secondary to blood loss (chronic): Secondary | ICD-10-CM

## 2022-01-21 MED ORDER — SODIUM CHLORIDE 0.9 % IV SOLN
400.0000 mg | Freq: Once | INTRAVENOUS | Status: AC
  Administered 2022-01-21: 400 mg via INTRAVENOUS
  Filled 2022-01-21: qty 20

## 2022-01-21 MED ORDER — SODIUM CHLORIDE 0.9 % IV SOLN: Freq: Once | INTRAVENOUS | Status: AC

## 2022-01-21 MED ORDER — LORATADINE 10 MG PO TABS
10.0000 mg | ORAL_TABLET | Freq: Once | ORAL | Status: AC
  Administered 2022-01-21: 10 mg via ORAL
  Filled 2022-01-21: qty 1

## 2022-01-21 NOTE — Progress Notes (Signed)
Pt declined to be observed for 30 minutes post Venofer infusion. Pt tolerated trtmt well w/out incident. VSS at discharge.  Ambulatory to lobby.   

## 2022-01-21 NOTE — Patient Instructions (Signed)

## 2022-02-04 ENCOUNTER — Inpatient Hospital Stay: Payer: Medicare Other

## 2022-02-04 DIAGNOSIS — D500 Iron deficiency anemia secondary to blood loss (chronic): Secondary | ICD-10-CM

## 2022-02-04 DIAGNOSIS — D509 Iron deficiency anemia, unspecified: Secondary | ICD-10-CM | POA: Diagnosis not present

## 2022-02-04 LAB — CBC WITH DIFFERENTIAL/PLATELET
Abs Immature Granulocytes: 0.02 10*3/uL (ref 0.00–0.07)
Basophils Absolute: 0 10*3/uL (ref 0.0–0.1)
Basophils Relative: 0 %
Eosinophils Absolute: 0.2 10*3/uL (ref 0.0–0.5)
Eosinophils Relative: 5 %
HCT: 36.4 % — ABNORMAL LOW (ref 39.0–52.0)
Hemoglobin: 11.1 g/dL — ABNORMAL LOW (ref 13.0–17.0)
Immature Granulocytes: 0 %
Lymphocytes Relative: 31 %
Lymphs Abs: 1.7 10*3/uL (ref 0.7–4.0)
MCH: 24.4 pg — ABNORMAL LOW (ref 26.0–34.0)
MCHC: 30.5 g/dL (ref 30.0–36.0)
MCV: 80.2 fL (ref 80.0–100.0)
Monocytes Absolute: 0.4 10*3/uL (ref 0.1–1.0)
Monocytes Relative: 8 %
Neutro Abs: 3 10*3/uL (ref 1.7–7.7)
Neutrophils Relative %: 56 %
Platelets: 184 10*3/uL (ref 150–400)
RBC: 4.54 MIL/uL (ref 4.22–5.81)
RDW: 27.9 % — ABNORMAL HIGH (ref 11.5–15.5)
WBC: 5.4 10*3/uL (ref 4.0–10.5)
nRBC: 0 % (ref 0.0–0.2)

## 2022-02-04 LAB — FERRITIN: Ferritin: 61 ng/mL (ref 24–336)

## 2022-03-03 DIAGNOSIS — J449 Chronic obstructive pulmonary disease, unspecified: Secondary | ICD-10-CM | POA: Insufficient documentation

## 2022-03-03 NOTE — Assessment & Plan Note (Signed)
-  he has h/o anemia since at least 02/2019, when he was hospitalized and received IV Feraheme. His Hg has been in 6-9 g/dl range in the past year and again required blood transfusion in 07/2021 -most recent colonoscopy 02/2021, EGD 08/05/21, and capsule endoscopy 08/13/21. 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 -he received iv Venofer'400mg'$ X3 from 12/31/21-01/21/2022

## 2022-03-04 ENCOUNTER — Other Ambulatory Visit: Payer: Self-pay

## 2022-03-04 ENCOUNTER — Encounter: Payer: Self-pay | Admitting: Hematology

## 2022-03-04 ENCOUNTER — Inpatient Hospital Stay: Payer: Medicare Other | Attending: Internal Medicine | Admitting: Hematology

## 2022-03-04 ENCOUNTER — Inpatient Hospital Stay: Payer: Medicare Other

## 2022-03-04 VITALS — BP 142/75 | HR 70 | Temp 97.8°F | Resp 18 | Ht 75.0 in | Wt 184.2 lb

## 2022-03-04 DIAGNOSIS — D500 Iron deficiency anemia secondary to blood loss (chronic): Secondary | ICD-10-CM | POA: Insufficient documentation

## 2022-03-04 DIAGNOSIS — Z7901 Long term (current) use of anticoagulants: Secondary | ICD-10-CM | POA: Diagnosis not present

## 2022-03-04 DIAGNOSIS — I10 Essential (primary) hypertension: Secondary | ICD-10-CM | POA: Diagnosis not present

## 2022-03-04 DIAGNOSIS — J42 Unspecified chronic bronchitis: Secondary | ICD-10-CM

## 2022-03-04 LAB — CBC WITH DIFFERENTIAL/PLATELET
Abs Immature Granulocytes: 0.02 10*3/uL (ref 0.00–0.07)
Basophils Absolute: 0 10*3/uL (ref 0.0–0.1)
Basophils Relative: 0 %
Eosinophils Absolute: 0.3 10*3/uL (ref 0.0–0.5)
Eosinophils Relative: 5 %
HCT: 33.8 % — ABNORMAL LOW (ref 39.0–52.0)
Hemoglobin: 10.7 g/dL — ABNORMAL LOW (ref 13.0–17.0)
Immature Granulocytes: 0 %
Lymphocytes Relative: 27 %
Lymphs Abs: 1.4 10*3/uL (ref 0.7–4.0)
MCH: 25.5 pg — ABNORMAL LOW (ref 26.0–34.0)
MCHC: 31.7 g/dL (ref 30.0–36.0)
MCV: 80.5 fL (ref 80.0–100.0)
Monocytes Absolute: 0.5 10*3/uL (ref 0.1–1.0)
Monocytes Relative: 9 %
Neutro Abs: 3.1 10*3/uL (ref 1.7–7.7)
Neutrophils Relative %: 59 %
Platelets: 233 10*3/uL (ref 150–400)
RBC: 4.2 MIL/uL — ABNORMAL LOW (ref 4.22–5.81)
RDW: 23.9 % — ABNORMAL HIGH (ref 11.5–15.5)
WBC: 5.3 10*3/uL (ref 4.0–10.5)
nRBC: 0 % (ref 0.0–0.2)

## 2022-03-04 LAB — FERRITIN: Ferritin: 11 ng/mL — ABNORMAL LOW (ref 24–336)

## 2022-03-04 NOTE — Progress Notes (Signed)
[place]   Telephone:(336) [phone] Fax:(336) [phone]   Clinic Follow up Note   Patient Care Team: Sonia Side., FNP as PCP - General (Family Medicine) Otis Brace, MD as Consulting Physician (Gastroenterology) Truitt Merle, MD as Consulting Physician (Hematology) Laurin Coder, MD as Consulting Physician (Pulmonary Disease) Marty Heck, MD as Consulting Physician (Vascular Surgery)  Date of Service:  03/04/2022  CHIEF COMPLAINT: f/u of anemia  CURRENT THERAPY:  IV Venofer '400mg'$ , as needed (if ferritn<30) since 12/2021  ASSESSMENT:  Elijah Sandoval is a 71 y.o. male with   Iron deficiency anemia due to chronic blood loss -he has h/o anemia since at least 02/2019, when he was hospitalized and received IV Feraheme. His Hg has been in 6-9 g/dl range in the past year and again required blood transfusion in 07/2021 -most recent colonoscopy 02/2021, EGD 08/05/21, and capsule endoscopy 08/13/21. 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 -he received iv Venofer '400mg'$  X3 from 12/31/21 - 01/21/22, he felt better after infusions    PLAN: -lab reviewed, his hgb responded well and reached 11.1 on last lab. Today, hgb is 10.7, ferritin is pending. Discussed he may need more iron depending on ferritin level. -I advised him to start OTC vit B12 supplement and restart oral iron if he tolerates well. -labs monthly, will call with results -f/u in 4 months   INTERVAL HISTORY:  Elijah Sandoval is here for a follow up of anemia. He was last seen by me on 12/20/21 in consultation. He presents to the clinic alone. He reports improvement after IV iron, especially with improved fatigue. He notes his energy isn't back to normal but much better. He denies any bleeding.   All other systems were reviewed with the patient and are negative.  MEDICAL HISTORY:  Past Medical History:  Diagnosis Date   COPD (chronic obstructive pulmonary disease) ([place])     Diabetes mellitus    Stroke ([place])    Pt reports TIA in 2013   Transfusion history     SURGICAL HISTORY: Past Surgical History:  Procedure Laterality Date   ESOPHAGOGASTRODUODENOSCOPY (EGD) WITH PROPOFOL N/A 08/05/2021   Procedure: ESOPHAGOGASTRODUODENOSCOPY (EGD) WITH PROPOFOL;  Surgeon: Ronnette Juniper, MD;  [facility];  Service: Gastroenterology;  Laterality: N/A;   GIVENS CAPSULE STUDY N/A 08/05/2021   Procedure: GIVENS CAPSULE STUDY;  Surgeon: Ronnette Juniper, MD;  Location: [facility];  Service: Gastroenterology;  Laterality: N/A;   HOT HEMOSTASIS N/A 08/05/2021   Procedure: HOT HEMOSTASIS (ARGON PLASMA COAGULATION/BICAP);  Surgeon: Ronnette Juniper, MD;  Location: [facility];  Service: Gastroenterology;  Laterality: N/A;   NO PAST SURGERIES      I have reviewed the social history and family history with the patient and they are unchanged from previous note.  ALLERGIES:  is allergic to latex.  MEDICATIONS:  Current Outpatient Medications  Medication Sig Dispense Refill   amLODipine (NORVASC) 5 MG tablet Take 5 mg by mouth daily.     atorvastatin (LIPITOR) 40 MG tablet Take 1 tablet (40 mg total) by mouth daily at 6 PM. (Patient taking differently: Take 40 mg by mouth daily.) 30 tablet 0   ELIQUIS 5 MG TABS tablet TAKE 1 TABLET BY MOUTH TWICE A DAY 60 tablet 3   famotidine (PEPCID) 20 MG tablet Take 1 tablet (20 mg total) by mouth 2 (two) times daily as needed for heartburn or indigestion (itching).     ferrous sulfate 325 (65 FE) MG tablet Take 325  mg by mouth 2 (two) times daily with a meal.     glipiZIDE (GLUCOTROL) 10 MG tablet Take 10 mg by mouth 2 (two) times daily.     hydrOXYzine (ATARAX) 25 MG tablet Take 25 mg by mouth 3 (three) times daily as needed for itching or anxiety.     latanoprost (XALATAN) 0.005 % ophthalmic solution Place 1 drop into both eyes at bedtime.     metoprolol succinate (TOPROL-XL) 100 MG 24 hr tablet Take 100 mg by mouth daily.     pantoprazole  (PROTONIX) 40 MG tablet Take 40 mg by mouth 2 (two) times daily.     Tiotropium Bromide-Olodaterol (STIOLTO RESPIMAT) 2.5-2.5 MCG/ACT AERS Inhale 2 puffs into the lungs daily. 4 g 0   Tiotropium Bromide-Olodaterol (STIOLTO RESPIMAT) 2.5-2.5 MCG/ACT AERS Inhale 2 puffs into the lungs daily. 4 g 6   triamcinolone cream (KENALOG) 0.1 % Apply 1 application. topically daily as needed (itching).     No current facility-administered medications for this visit.    PHYSICAL EXAMINATION: ECOG PERFORMANCE STATUS: 1 - Symptomatic but completely ambulatory  Vitals:   03/04/22 0952  BP: (!) 142/75  Pulse: 70  Resp: 18  Temp: 97.8 F (36.6 C)  SpO2: 97%   Wt Readings from Last 3 Encounters:  03/04/22 184 lb 3.2 oz (83.6 kg)  12/21/21 179 lb 11.2 oz (81.5 kg)  08/16/21 177 lb 3.2 oz (80.4 kg)     GENERAL:alert, no distress and comfortable SKIN: skin color normal, no rashes or significant lesions EYES: normal, Conjunctiva are pink and non-injected, sclera clear  NEURO: alert & oriented x 3 with fluent speech  LABORATORY DATA:  I have reviewed the data as listed    Latest Ref Rng & Units 03/04/2022    9:18 AM 02/04/2022    9:19 AM 01/07/2022    9:03 AM  CBC  WBC 4.0 - 10.5 K/uL 5.3  5.4  6.0   Hemoglobin 13.0 - 17.0 g/dL 10.7  11.1  8.0   Hematocrit 39.0 - 52.0 % 33.8  36.4  27.3   Platelets 150 - 400 K/uL 233  184  189         Latest Ref Rng & Units 08/06/2021    3:23 AM 08/05/2021    8:24 AM 08/04/2021    6:35 AM  CMP  Glucose 70 - 99 mg/dL 257  196  232   BUN 8 - 23 mg/dL '10  9  22   '$ Creatinine 0.61 - 1.24 mg/dL 1.18  0.97  1.13   Sodium 135 - 145 mmol/L 137  139  138   Potassium 3.5 - 5.1 mmol/L 3.9  3.7  4.0   Chloride 98 - 111 mmol/L 109  112  109   CO2 22 - 32 mmol/L '22  22  22   '$ Calcium 8.9 - 10.3 mg/dL 8.6  8.8  9.1   Total Protein 6.5 - 8.1 g/dL   6.6   Total Bilirubin 0.3 - 1.2 mg/dL   1.6   Alkaline Phos 38 - 126 U/L   69   AST 15 - 41 U/L   15   ALT 0 - 44 U/L    13       RADIOGRAPHIC STUDIES: I have personally reviewed the radiological images as listed and agreed with the findings in the report. No results found.    No orders of the defined types were placed in this encounter.  All questions were answered. The patient knows to  call the clinic with any problems, questions or concerns. No barriers to learning was detected. The total time spent in the appointment was 25 minutes.     Truitt Merle, MD 03/04/2022   I, Wilburn Mylar, am acting as scribe for Truitt Merle, MD.   I have reviewed the above documentation for accuracy and completeness, and I agree with the above.

## 2022-03-08 ENCOUNTER — Telehealth: Payer: Self-pay | Admitting: *Deleted

## 2022-03-08 NOTE — Telephone Encounter (Signed)
-----   Message from Truitt Merle, MD sent at 03/08/2022  8:57 AM EST ----- Please let pt know that his iron level is low again, please schedule iv Venofer '300mg'$  [id number], thanks   Truitt Merle

## 2022-03-08 NOTE — Telephone Encounter (Signed)
Per Dr.Feng, called pt with message below. Advised pt to call the office for any other concerns. Pt is aware of IV iron appts starting on 12/06 @ 1pm. Pt verbalized all understanding

## 2022-03-20 ENCOUNTER — Other Ambulatory Visit: Payer: Self-pay

## 2022-03-20 ENCOUNTER — Inpatient Hospital Stay: Payer: Medicare Other | Attending: Internal Medicine

## 2022-03-20 ENCOUNTER — Other Ambulatory Visit: Payer: Self-pay | Admitting: *Deleted

## 2022-03-20 VITALS — BP 144/80 | HR 65 | Temp 98.1°F | Resp 18

## 2022-03-20 DIAGNOSIS — D500 Iron deficiency anemia secondary to blood loss (chronic): Secondary | ICD-10-CM | POA: Insufficient documentation

## 2022-03-20 MED ORDER — SODIUM CHLORIDE 0.9 % IV SOLN: INTRAVENOUS | Status: DC

## 2022-03-20 MED ORDER — LORATADINE 10 MG PO TABS
10.0000 mg | ORAL_TABLET | Freq: Every day | ORAL | Status: DC
  Administered 2022-03-20: 10 mg via ORAL
  Filled 2022-03-20: qty 1

## 2022-03-20 MED ORDER — SODIUM CHLORIDE 0.9 % IV SOLN
400.0000 mg | Freq: Once | INTRAVENOUS | Status: AC
  Administered 2022-03-20: 400 mg via INTRAVENOUS
  Filled 2022-03-20: qty 20

## 2022-03-20 NOTE — Patient Instructions (Signed)

## 2022-03-20 NOTE — Progress Notes (Signed)
Pt declined to be observed for 30 minutes post Venofer infusion. Pt tolerated trtmt well w/out incident. VSS at discharge.  Ambulatory to lobby.   

## 2022-03-27 ENCOUNTER — Other Ambulatory Visit: Payer: Self-pay

## 2022-03-27 ENCOUNTER — Inpatient Hospital Stay: Payer: Medicare Other

## 2022-03-27 VITALS — BP 108/57 | HR 66 | Temp 98.1°F | Resp 17

## 2022-03-27 DIAGNOSIS — D500 Iron deficiency anemia secondary to blood loss (chronic): Secondary | ICD-10-CM | POA: Diagnosis not present

## 2022-03-27 MED ORDER — SODIUM CHLORIDE 0.9 % IV SOLN: INTRAVENOUS | Status: DC

## 2022-03-27 MED ORDER — SODIUM CHLORIDE 0.9 % IV SOLN
400.0000 mg | Freq: Once | INTRAVENOUS | Status: AC
  Administered 2022-03-27: 400 mg via INTRAVENOUS
  Filled 2022-03-27: qty 20

## 2022-03-27 MED ORDER — LORATADINE 10 MG PO TABS
10.0000 mg | ORAL_TABLET | Freq: Every day | ORAL | Status: DC
  Administered 2022-03-27: 10 mg via ORAL
  Filled 2022-03-27: qty 1

## 2022-04-03 ENCOUNTER — Inpatient Hospital Stay: Payer: Medicare Other

## 2022-04-03 ENCOUNTER — Other Ambulatory Visit: Payer: Self-pay

## 2022-04-03 DIAGNOSIS — D500 Iron deficiency anemia secondary to blood loss (chronic): Secondary | ICD-10-CM | POA: Diagnosis not present

## 2022-04-03 LAB — CBC WITH DIFFERENTIAL/PLATELET
Abs Immature Granulocytes: 0.02 10*3/uL (ref 0.00–0.07)
Basophils Absolute: 0 10*3/uL (ref 0.0–0.1)
Basophils Relative: 0 %
Eosinophils Absolute: 0.2 10*3/uL (ref 0.0–0.5)
Eosinophils Relative: 5 %
HCT: 31.3 % — ABNORMAL LOW (ref 39.0–52.0)
Hemoglobin: 9.9 g/dL — ABNORMAL LOW (ref 13.0–17.0)
Immature Granulocytes: 0 %
Lymphocytes Relative: 28 %
Lymphs Abs: 1.3 10*3/uL (ref 0.7–4.0)
MCH: 26.3 pg (ref 26.0–34.0)
MCHC: 31.6 g/dL (ref 30.0–36.0)
MCV: 83 fL (ref 80.0–100.0)
Monocytes Absolute: 0.5 10*3/uL (ref 0.1–1.0)
Monocytes Relative: 11 %
Neutro Abs: 2.6 10*3/uL (ref 1.7–7.7)
Neutrophils Relative %: 56 %
Platelets: 256 10*3/uL (ref 150–400)
RBC: 3.77 MIL/uL — ABNORMAL LOW (ref 4.22–5.81)
RDW: 24 % — ABNORMAL HIGH (ref 11.5–15.5)
WBC: 4.7 10*3/uL (ref 4.0–10.5)
nRBC: 0 % (ref 0.0–0.2)

## 2022-04-03 LAB — FERRITIN: Ferritin: 106 ng/mL (ref 24–336)

## 2022-04-10 ENCOUNTER — Inpatient Hospital Stay: Payer: Medicare Other

## 2022-04-10 ENCOUNTER — Other Ambulatory Visit: Payer: Self-pay

## 2022-04-10 VITALS — BP 146/71 | HR 66 | Temp 98.7°F | Resp 17

## 2022-04-10 DIAGNOSIS — D500 Iron deficiency anemia secondary to blood loss (chronic): Secondary | ICD-10-CM | POA: Diagnosis not present

## 2022-04-10 MED ORDER — SODIUM CHLORIDE 0.9 % IV SOLN
400.0000 mg | Freq: Once | INTRAVENOUS | Status: AC
  Administered 2022-04-10: 400 mg via INTRAVENOUS
  Filled 2022-04-10: qty 20

## 2022-04-10 MED ORDER — LORATADINE 10 MG PO TABS
10.0000 mg | ORAL_TABLET | Freq: Every day | ORAL | Status: DC
  Administered 2022-04-10: 10 mg via ORAL
  Filled 2022-04-10: qty 1

## 2022-04-10 MED ORDER — SODIUM CHLORIDE 0.9 % IV SOLN: INTRAVENOUS | Status: DC

## 2022-04-10 NOTE — Progress Notes (Signed)
Pt declined to stay for 30 minute wait period post Venofer infusion. VSS at time of discharge.

## 2022-04-10 NOTE — Patient Instructions (Signed)

## 2022-04-11 ENCOUNTER — Telehealth: Payer: Self-pay

## 2022-04-11 ENCOUNTER — Other Ambulatory Visit: Payer: Self-pay

## 2022-04-11 DIAGNOSIS — E538 Deficiency of other specified B group vitamins: Secondary | ICD-10-CM

## 2022-04-11 NOTE — Telephone Encounter (Addendum)
Called patient and relayed message made office visit for 1-22 at 1000 and added a B12 to the labs.    ----- Message from Truitt Merle, MD sent at 04/10/2022  8:51 AM EST ----- Please let pt know his Hg has been slowly dropping, iron level is good, make sure he is taking oral B12 1068mg daily, and add B12 level on next lab appointment, and schedule OV with next lab, thanks   YTruitt Merle 04/10/2022

## 2022-05-05 NOTE — Assessment & Plan Note (Signed)
due to chronic blood loss -he has h/o anemia since at least 02/2019, when he was hospitalized and received IV Feraheme. His Hg has been in 6-9 g/dl range in the past year and again required blood transfusion in 07/2021 -most recent colonoscopy 02/2021, EGD 08/05/21, and capsule endoscopy 08/13/21. 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 -he received iv Venofer '400mg'$  X3 from 12/31/21 - 01/21/22, and again in Dec 2023 -he responded well, H/H improved

## 2022-05-06 ENCOUNTER — Inpatient Hospital Stay: Payer: Medicare Other | Admitting: Hematology

## 2022-05-06 ENCOUNTER — Inpatient Hospital Stay: Payer: Medicare Other | Attending: Internal Medicine

## 2022-05-06 ENCOUNTER — Other Ambulatory Visit: Payer: Self-pay

## 2022-05-06 VITALS — BP 139/62 | HR 72 | Temp 97.8°F | Resp 18 | Ht 75.0 in | Wt 186.2 lb

## 2022-05-06 DIAGNOSIS — E538 Deficiency of other specified B group vitamins: Secondary | ICD-10-CM

## 2022-05-06 DIAGNOSIS — Z79899 Other long term (current) drug therapy: Secondary | ICD-10-CM | POA: Insufficient documentation

## 2022-05-06 DIAGNOSIS — D500 Iron deficiency anemia secondary to blood loss (chronic): Secondary | ICD-10-CM

## 2022-05-06 DIAGNOSIS — K31819 Angiodysplasia of stomach and duodenum without bleeding: Secondary | ICD-10-CM | POA: Diagnosis not present

## 2022-05-06 LAB — CBC WITH DIFFERENTIAL/PLATELET
Abs Immature Granulocytes: 0.02 10*3/uL (ref 0.00–0.07)
Basophils Absolute: 0 10*3/uL (ref 0.0–0.1)
Basophils Relative: 1 %
Eosinophils Absolute: 0.3 10*3/uL (ref 0.0–0.5)
Eosinophils Relative: 5 %
HCT: 35.5 % — ABNORMAL LOW (ref 39.0–52.0)
Hemoglobin: 11.5 g/dL — ABNORMAL LOW (ref 13.0–17.0)
Immature Granulocytes: 0 %
Lymphocytes Relative: 23 %
Lymphs Abs: 1.2 10*3/uL (ref 0.7–4.0)
MCH: 28.2 pg (ref 26.0–34.0)
MCHC: 32.4 g/dL (ref 30.0–36.0)
MCV: 87 fL (ref 80.0–100.0)
Monocytes Absolute: 0.5 10*3/uL (ref 0.1–1.0)
Monocytes Relative: 9 %
Neutro Abs: 3.4 10*3/uL (ref 1.7–7.7)
Neutrophils Relative %: 62 %
Platelets: 234 10*3/uL (ref 150–400)
RBC: 4.08 MIL/uL — ABNORMAL LOW (ref 4.22–5.81)
RDW: 20.4 % — ABNORMAL HIGH (ref 11.5–15.5)
WBC: 5.4 10*3/uL (ref 4.0–10.5)
nRBC: 0 % (ref 0.0–0.2)

## 2022-05-06 LAB — FERRITIN: Ferritin: 42 ng/mL (ref 24–336)

## 2022-05-06 LAB — VITAMIN B12: Vitamin B-12: 658 pg/mL (ref 180–914)

## 2022-05-06 NOTE — Progress Notes (Signed)
[place]   [phone] [phone]   Clinic Follow up Note   Patient Care Team: Sonia Side., FNP as PCP - General (Family Medicine) Otis Brace, MD as Consulting Physician (Gastroenterology) Truitt Merle, MD as Consulting Physician (Hematology) Laurin Coder, MD as Consulting Physician (Pulmonary Disease) Marty Heck, MD as Consulting Physician (Vascular Surgery)  Date of Service:  05/06/2022  CHIEF COMPLAINT: f/u of anemia   CURRENT THERAPY:  IV Venofer '400mg'$ , as needed (if ferritn<30) since 12/2021    ASSESSMENT:  Elijah Sandoval is a 72 y.o. male with   Iron deficiency anemia due to chronic blood loss due to chronic blood loss -he has h/o anemia since at least 02/2019, when he was hospitalized and received IV Feraheme. His Hg has been in 6-9 g/dl range in the past year and again required blood transfusion in 07/2021 -most recent colonoscopy 02/2021, EGD 08/05/21, and capsule endoscopy 08/13/21. 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 -he received iv Venofer '400mg'$  X3 from 12/31/21 - 01/21/22, and again in Dec 2023 -he responded well, H/H improved   PLAN: -Lab reviewed  hg 11.5, iron result pending  - Recommend once a month lab to determined if IV Iron is needed. -f/u in 4 months.  If patient requires frequent IV iron, I may schedule his infusion every 1 to 2 months after next visit.    INTERVAL HISTORY:  Elijah Sandoval is here for a follow up of anemia  He was last seen by me on 03/04/2022.He presents to the clinic alone. Pt states he feels a difference since receiving 6 infusion but not back 100% better.      All other systems were reviewed with the patient and are negative.  MEDICAL HISTORY:  Past Medical History:  Diagnosis Date   COPD (chronic obstructive pulmonary disease) ([place])    Diabetes mellitus    Stroke ([place])    Pt reports TIA in 2013   Transfusion history     SURGICAL  HISTORY: Past Surgical History:  Procedure Laterality Date   ESOPHAGOGASTRODUODENOSCOPY (EGD) WITH PROPOFOL N/A 08/05/2021   Procedure: ESOPHAGOGASTRODUODENOSCOPY (EGD) WITH PROPOFOL;  Surgeon: Ronnette Juniper, MD;  Location: [facility];  Service: Gastroenterology;  Laterality: N/A;   GIVENS CAPSULE STUDY N/A 08/05/2021   Procedure: GIVENS CAPSULE STUDY;  Surgeon: Ronnette Juniper, MD;  Location: [facility];  Service: Gastroenterology;  Laterality: N/A;   HOT HEMOSTASIS N/A 08/05/2021   Procedure: HOT HEMOSTASIS (ARGON PLASMA COAGULATION/BICAP);  Surgeon: Ronnette Juniper, MD;  Location: [facility];  Service: Gastroenterology;  Laterality: N/A;   NO PAST SURGERIES      I have reviewed the social history and family history with the patient and they are unchanged from previous note.  ALLERGIES:  is allergic to latex.  MEDICATIONS:  Current Outpatient Medications  Medication Sig Dispense Refill   amLODipine (NORVASC) 5 MG tablet Take 5 mg by mouth daily.     atorvastatin (LIPITOR) 40 MG tablet Take 1 tablet (40 mg total) by mouth daily at 6 PM. (Patient taking differently: Take 40 mg by mouth daily.) 30 tablet 0   ELIQUIS 5 MG TABS tablet TAKE 1 TABLET BY MOUTH TWICE A DAY 60 tablet 3   famotidine (PEPCID) 20 MG tablet Take 1 tablet (20 mg total) by mouth 2 (two) times daily as needed for heartburn or indigestion (itching).     ferrous sulfate 325 (65 FE) MG tablet Take 325 mg by mouth 2 (two) times  daily with a meal.     glipiZIDE (GLUCOTROL) 10 MG tablet Take 10 mg by mouth 2 (two) times daily.     hydrOXYzine (ATARAX) 25 MG tablet Take 25 mg by mouth 3 (three) times daily as needed for itching or anxiety.     latanoprost (XALATAN) 0.005 % ophthalmic solution Place 1 drop into both eyes at bedtime.     metoprolol succinate (TOPROL-XL) 100 MG 24 hr tablet Take 100 mg by mouth daily.     pantoprazole (PROTONIX) 40 MG tablet Take 40 mg by mouth 2 (two) times daily.     Tiotropium Bromide-Olodaterol  (STIOLTO RESPIMAT) 2.5-2.5 MCG/ACT AERS Inhale 2 puffs into the lungs daily. 4 g 0   Tiotropium Bromide-Olodaterol (STIOLTO RESPIMAT) 2.5-2.5 MCG/ACT AERS Inhale 2 puffs into the lungs daily. 4 g 6   triamcinolone cream (KENALOG) 0.1 % Apply 1 application. topically daily as needed (itching).     No current facility-administered medications for this visit.    PHYSICAL EXAMINATION: ECOG PERFORMANCE STATUS: 1 - Symptomatic but completely ambulatory  Vitals:   05/06/22 1005  BP: 139/62  Pulse: 72  Resp: 18  Temp: 97.8 F (36.6 C)  SpO2: 99%   Wt Readings from Last 3 Encounters:  05/06/22 186 lb 3.2 oz (84.5 kg)  03/04/22 184 lb 3.2 oz (83.6 kg)  12/21/21 179 lb 11.2 oz (81.5 kg)     GENERAL:alert, no distress and comfortable SKIN: skin color normal, no rashes or significant lesions EYES: normal, Conjunctiva are pink and non-injected, sclera clear  NEURO: alert & oriented x 3 with fluent speech  LABORATORY DATA:  I have reviewed the data as listed    Latest Ref Rng & Units 05/06/2022    9:45 AM 04/03/2022    9:45 AM 03/04/2022    9:18 AM  CBC  WBC 4.0 - 10.5 K/uL 5.4  4.7  5.3   Hemoglobin 13.0 - 17.0 g/dL 11.5  9.9  10.7   Hematocrit 39.0 - 52.0 % 35.5  31.3  33.8   Platelets 150 - 400 K/uL 234  256  233         Latest Ref Rng & Units 08/06/2021    3:23 AM 08/05/2021    8:24 AM 08/04/2021    6:35 AM  CMP  Glucose 70 - 99 mg/dL 257  196  232   BUN 8 - 23 mg/dL '10  9  22   '$ Creatinine 0.61 - 1.24 mg/dL 1.18  0.97  1.13   Sodium 135 - 145 mmol/L 137  139  138   Potassium 3.5 - 5.1 mmol/L 3.9  3.7  4.0   Chloride 98 - 111 mmol/L 109  112  109   CO2 22 - 32 mmol/L '22  22  22   '$ Calcium 8.9 - 10.3 mg/dL 8.6  8.8  9.1   Total Protein 6.5 - 8.1 g/dL   6.6   Total Bilirubin 0.3 - 1.2 mg/dL   1.6   Alkaline Phos 38 - 126 U/L   69   AST 15 - 41 U/L   15   ALT 0 - 44 U/L   13       RADIOGRAPHIC STUDIES: I have personally reviewed the radiological images as listed and  agreed with the findings in the report. No results found.    No orders of the defined types were placed in this encounter.  All questions were answered. The patient knows to call the clinic with any problems, questions  or concerns. No barriers to learning was detected. The total time spent in the appointment was 15 minutes.     Truitt Merle, MD 05/06/2022   Felicity Coyer, CMA, am acting as scribe for Truitt Merle, MD.   I have reviewed the above documentation for accuracy and completeness, and I agree with the above.

## 2022-05-10 ENCOUNTER — Encounter: Payer: Self-pay | Admitting: Hematology

## 2022-05-10 ENCOUNTER — Telehealth: Payer: Self-pay

## 2022-05-10 ENCOUNTER — Other Ambulatory Visit: Payer: Self-pay

## 2022-05-10 NOTE — Telephone Encounter (Signed)
Pt called wanting to know the status of his blood work from Monday.  Pt stated Dr. Burr Medico was going to contact him regarding his lab results.  Notified Dr. Burr Medico of pt's call.

## 2022-05-13 ENCOUNTER — Telehealth: Payer: Self-pay | Admitting: *Deleted

## 2022-05-13 NOTE — Telephone Encounter (Signed)
Notified of message below

## 2022-05-13 NOTE — Telephone Encounter (Signed)
-----  Message from Truitt Merle, MD sent at 05/12/2022  3:59 PM EST ----- Please let pt know that his iron level is good last week, no need for iv iron for now, thx   Truitt Merle

## 2022-05-16 ENCOUNTER — Other Ambulatory Visit: Payer: Self-pay | Admitting: Vascular Surgery

## 2022-05-21 ENCOUNTER — Ambulatory Visit: Payer: Medicare Other | Admitting: Podiatry

## 2022-05-21 ENCOUNTER — Encounter: Payer: Self-pay | Admitting: Podiatry

## 2022-05-21 DIAGNOSIS — B351 Tinea unguium: Secondary | ICD-10-CM | POA: Diagnosis not present

## 2022-05-21 DIAGNOSIS — M79674 Pain in right toe(s): Secondary | ICD-10-CM

## 2022-05-21 DIAGNOSIS — E1165 Type 2 diabetes mellitus with hyperglycemia: Secondary | ICD-10-CM

## 2022-05-21 DIAGNOSIS — B353 Tinea pedis: Secondary | ICD-10-CM | POA: Diagnosis not present

## 2022-05-21 DIAGNOSIS — E1142 Type 2 diabetes mellitus with diabetic polyneuropathy: Secondary | ICD-10-CM | POA: Diagnosis not present

## 2022-05-21 DIAGNOSIS — M79675 Pain in left toe(s): Secondary | ICD-10-CM

## 2022-05-21 MED ORDER — CLOTRIMAZOLE-BETAMETHASONE 1-0.05 % EX CREA: 1.0000 | TOPICAL_CREAM | Freq: Two times a day (BID) | CUTANEOUS | 2 refills | Status: AC

## 2022-05-22 NOTE — Progress Notes (Signed)
  Subjective:  Patient ID: Elijah Sandoval, male    DOB: 03-Dec-1950,  MRN: [id number]  Chief Complaint  Patient presents with   Diabetes    Diabetic Foot Care Referring Provider: Dustin Folks A JR  A1C 8.6   Nail Problem    Thick painful toenails he is unable to cut himself   Callouses    Pre-ulcerative callus left>right    72 y.o. male presents with the above complaint. History confirmed with patient.  He notes some numbness and tingling occasionally.  Objective:  Physical Exam: warm, good capillary refill, no trophic changes or ulcerative lesions, normal DP and PT pulses, normal sensory exam, and thickening scaling skin on both feet consistent with tinea pedis.  Inconsistent monofilament exam Left Foot: dystrophic yellowed discolored nail plates with subungual debris Right Foot: dystrophic yellowed discolored nail plates with subungual debris  Assessment:   1. Pain due to onychomycosis of toenails of both feet   2. Tinea pedis of both feet   3. Uncontrolled type 2 diabetes mellitus with hyperglycemia, without long-term current use of insulin ([place])   4. Diabetic peripheral neuropathy ([place])      Plan:  Patient was evaluated and treated and all questions answered.  Patient educated on diabetes. Discussed proper diabetic foot care and discussed risks and complications of disease. Educated patient in depth on reasons to return to the office immediately should he/she discover anything concerning or new on the feet. All questions answered. Discussed proper shoes as well.   Discussed the etiology and treatment options for the condition in detail with the patient. Educated patient on the topical and oral treatment options for mycotic nails. Recommended debridement of the nails today. Sharp and mechanical debridement performed of all painful and mycotic nails today. Nails debrided in length and thickness using a nail nipper to level of comfort. Discussed treatment options including appropriate  shoe gear. Follow up as needed for painful nails.   Discussed the etiology and treatment options for tinea pedis.  Discussed topical and oral treatment.  Recommended topical treatment with Lotrisone cream.  This was sent to the patient's pharmacy.  Also discussed appropriate foot hygiene, use of antifungal spray such as Tinactin in shoes, as well as cleaning her foot surfaces such as showers and bathroom floors with bleach.   Return in about 3 months (around 08/19/2022) for at risk diabetic foot care.

## 2022-06-04 ENCOUNTER — Other Ambulatory Visit: Payer: Self-pay

## 2022-06-04 ENCOUNTER — Inpatient Hospital Stay: Payer: Medicare Other | Attending: Internal Medicine

## 2022-06-04 DIAGNOSIS — D500 Iron deficiency anemia secondary to blood loss (chronic): Secondary | ICD-10-CM | POA: Diagnosis present

## 2022-06-04 DIAGNOSIS — K5520 Angiodysplasia of colon without hemorrhage: Secondary | ICD-10-CM | POA: Insufficient documentation

## 2022-06-04 LAB — CBC WITH DIFFERENTIAL/PLATELET
Abs Immature Granulocytes: 0.02 10*3/uL (ref 0.00–0.07)
Basophils Absolute: 0 10*3/uL (ref 0.0–0.1)
Basophils Relative: 1 %
Eosinophils Absolute: 0.2 10*3/uL (ref 0.0–0.5)
Eosinophils Relative: 3 %
HCT: 31.4 % — ABNORMAL LOW (ref 39.0–52.0)
Hemoglobin: 10.3 g/dL — ABNORMAL LOW (ref 13.0–17.0)
Immature Granulocytes: 0 %
Lymphocytes Relative: 28 %
Lymphs Abs: 1.7 10*3/uL (ref 0.7–4.0)
MCH: 27.8 pg (ref 26.0–34.0)
MCHC: 32.8 g/dL (ref 30.0–36.0)
MCV: 84.9 fL (ref 80.0–100.0)
Monocytes Absolute: 0.6 10*3/uL (ref 0.1–1.0)
Monocytes Relative: 10 %
Neutro Abs: 3.6 10*3/uL (ref 1.7–7.7)
Neutrophils Relative %: 58 %
Platelets: 227 10*3/uL (ref 150–400)
RBC: 3.7 MIL/uL — ABNORMAL LOW (ref 4.22–5.81)
RDW: 17.2 % — ABNORMAL HIGH (ref 11.5–15.5)
WBC: 6.2 10*3/uL (ref 4.0–10.5)
nRBC: 0 % (ref 0.0–0.2)

## 2022-06-04 LAB — FERRITIN: Ferritin: 10 ng/mL — ABNORMAL LOW (ref 24–336)

## 2022-06-10 ENCOUNTER — Telehealth: Payer: Self-pay

## 2022-06-10 ENCOUNTER — Inpatient Hospital Stay: Payer: Medicare Other

## 2022-06-10 NOTE — Telephone Encounter (Signed)
Patient called wanting to get the results from his lab work that he did last week. He wants to know if we could call him and let him know what they were. He inquired to see if Dr. Burr Medico could please call and let him know.

## 2022-06-13 ENCOUNTER — Encounter: Payer: Self-pay | Admitting: Hematology

## 2022-06-22 ENCOUNTER — Inpatient Hospital Stay: Payer: Medicare Other | Attending: Internal Medicine

## 2022-06-22 VITALS — BP 137/72 | HR 98 | Temp 97.8°F | Resp 16

## 2022-06-22 DIAGNOSIS — D500 Iron deficiency anemia secondary to blood loss (chronic): Secondary | ICD-10-CM | POA: Insufficient documentation

## 2022-06-22 DIAGNOSIS — K31819 Angiodysplasia of stomach and duodenum without bleeding: Secondary | ICD-10-CM | POA: Insufficient documentation

## 2022-06-22 MED ORDER — SODIUM CHLORIDE 0.9 % IV SOLN: INTRAVENOUS | Status: DC

## 2022-06-22 MED ORDER — SODIUM CHLORIDE 0.9 % IV SOLN
400.0000 mg | Freq: Once | INTRAVENOUS | Status: AC
  Administered 2022-06-22: 400 mg via INTRAVENOUS
  Filled 2022-06-22: qty 20

## 2022-06-22 MED ORDER — LORATADINE 10 MG PO TABS
10.0000 mg | ORAL_TABLET | Freq: Every day | ORAL | Status: DC
  Administered 2022-06-22: 10 mg via ORAL
  Filled 2022-06-22: qty 1

## 2022-06-22 NOTE — Patient Instructions (Signed)

## 2022-06-28 MED FILL — Iron Sucrose Inj 20 MG/ML (Fe Equiv): INTRAVENOUS | Qty: 20 | Status: AC

## 2022-06-29 ENCOUNTER — Inpatient Hospital Stay: Payer: Medicare Other

## 2022-06-29 NOTE — Progress Notes (Deleted)
Attempted to call patient x2 regarding 0800 iron infusion. No answer.

## 2022-07-02 NOTE — Assessment & Plan Note (Signed)
due to chronic blood loss -he has h/o anemia since at least 02/2019, when he was hospitalized and received IV Feraheme. His Hg has been in 6-9 g/dl range in the past year and again required blood transfusion in 07/2021 -most recent colonoscopy 02/2021, EGD 08/05/21, and capsule endoscopy 08/13/21. 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 -he received iv Venofer 400mg X3 from 12/31/21 - 01/21/22, and again in Dec 2023 -he responded well, H/H improved  

## 2022-07-03 ENCOUNTER — Encounter: Payer: Self-pay | Admitting: Hematology

## 2022-07-03 ENCOUNTER — Other Ambulatory Visit: Payer: Self-pay

## 2022-07-03 ENCOUNTER — Inpatient Hospital Stay: Payer: Medicare Other | Admitting: Hematology

## 2022-07-03 ENCOUNTER — Inpatient Hospital Stay (HOSPITAL_BASED_OUTPATIENT_CLINIC_OR_DEPARTMENT_OTHER): Payer: Medicare Other | Admitting: Hematology

## 2022-07-03 VITALS — BP 109/63 | HR 64 | Temp 98.4°F | Resp 15 | Ht 75.0 in | Wt 185.1 lb

## 2022-07-03 DIAGNOSIS — D500 Iron deficiency anemia secondary to blood loss (chronic): Secondary | ICD-10-CM

## 2022-07-03 LAB — CBC WITH DIFFERENTIAL/PLATELET
Abs Immature Granulocytes: 0.04 10*3/uL (ref 0.00–0.07)
Basophils Absolute: 0.1 10*3/uL (ref 0.0–0.1)
Basophils Relative: 1 %
Eosinophils Absolute: 0.3 10*3/uL (ref 0.0–0.5)
Eosinophils Relative: 4 %
HCT: 33.3 % — ABNORMAL LOW (ref 39.0–52.0)
Hemoglobin: 10.4 g/dL — ABNORMAL LOW (ref 13.0–17.0)
Immature Granulocytes: 1 %
Lymphocytes Relative: 31 %
Lymphs Abs: 2.1 10*3/uL (ref 0.7–4.0)
MCH: 26.4 pg (ref 26.0–34.0)
MCHC: 31.2 g/dL (ref 30.0–36.0)
MCV: 84.5 fL (ref 80.0–100.0)
Monocytes Absolute: 0.7 10*3/uL (ref 0.1–1.0)
Monocytes Relative: 10 %
Neutro Abs: 3.7 10*3/uL (ref 1.7–7.7)
Neutrophils Relative %: 53 %
Platelets: 265 10*3/uL (ref 150–400)
RBC: 3.94 MIL/uL — ABNORMAL LOW (ref 4.22–5.81)
RDW: 18.4 % — ABNORMAL HIGH (ref 11.5–15.5)
WBC: 6.9 10*3/uL (ref 4.0–10.5)
nRBC: 0 % (ref 0.0–0.2)

## 2022-07-03 LAB — FERRITIN: Ferritin: 79 ng/mL (ref 24–336)

## 2022-07-03 NOTE — Progress Notes (Signed)
Elijah Sandoval   Telephone:(336) [phone] Fax:(336) [phone]   Clinic Follow up Note   Patient Care Team: Sonia Side., FNP as PCP - General (Family Medicine) Otis Brace, MD as Consulting Physician (Gastroenterology) Truitt Merle, MD as Consulting Physician (Hematology) Laurin Coder, MD as Consulting Physician (Pulmonary Disease) Marty Heck, MD as Consulting Physician (Vascular Surgery)  Date of Service:  07/03/2022  CHIEF COMPLAINT: f/u of anemia   CURRENT THERAPY:  IV Venofer 400mg , as needed (if ferritn<30) since 12/2021    ASSESSMENT:  Elijah Sandoval is a 72 y.o. male with   Iron deficiency anemia due to chronic blood loss due to chronic blood loss -he has h/o anemia since at least 02/2019, when he was hospitalized and received IV Feraheme. His Hg has been in 6-9 g/dl range in the past year and again required blood transfusion in 07/2021 -most recent colonoscopy 02/2021, EGD 08/05/21, and capsule endoscopy 08/13/21. 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 -he received iv Venofer 400mg  X3 from 12/31/21 - 01/21/22, and again in Dec 2023 -he responded well, H/H improved  -He developed anemia again a few months after last dose of IV iron, and restart IV iron a few weeks ago -Lab reviewed, hemoglobin slightly improved, will proceed second dose IV Venofer later this week and continue weekly for 2 more weeks -Will monitor his lab monthly -If he continues to require frequent IV iron, I may schedule his IV iron regularly every 4-8 weeks      PLAN: -lab reviewed- hg 10.4 -He is scheduled to receive the second dose of Venofer 400 mg later this week -Recommend 2 more doses of weekly IV Venofer -Lab monthly -Follow-up in 3 months.    INTERVAL HISTORY:  Elijah Sandoval is here for a follow up of anemia  He was last seen by me on 05/06/2022 He presents to the clinic alone. Pt state that after he receive IV Iron he feels  better.   All other systems were reviewed with the patient and are negative.  MEDICAL HISTORY:  Past Medical History:  Diagnosis Date   COPD (chronic obstructive pulmonary disease) ([place])    Diabetes mellitus    Stroke ([place])    Pt reports TIA in 2013   Transfusion history     SURGICAL HISTORY: Past Surgical History:  Procedure Laterality Date   ESOPHAGOGASTRODUODENOSCOPY (EGD) WITH PROPOFOL N/A 08/05/2021   Procedure: ESOPHAGOGASTRODUODENOSCOPY (EGD) WITH PROPOFOL;  Surgeon: Ronnette Juniper, MD;  Location: [facility];  Service: Gastroenterology;  Laterality: N/A;   GIVENS CAPSULE STUDY N/A 08/05/2021   Procedure: GIVENS CAPSULE STUDY;  Surgeon: Ronnette Juniper, MD;  Location: [facility];  Service: Gastroenterology;  Laterality: N/A;   HOT HEMOSTASIS N/A 08/05/2021   Procedure: HOT HEMOSTASIS (ARGON PLASMA COAGULATION/BICAP);  Surgeon: Ronnette Juniper, MD;  Location: [facility];  Service: Gastroenterology;  Laterality: N/A;   NO PAST SURGERIES      I have reviewed the social history and family history with the patient and they are unchanged from previous note.  ALLERGIES:  is allergic to latex.  MEDICATIONS:  Current Outpatient Medications  Medication Sig Dispense Refill   amLODipine (NORVASC) 5 MG tablet Take 5 mg by mouth daily.     atorvastatin (LIPITOR) 40 MG tablet Take 1 tablet (40 mg total) by mouth daily at 6 PM. (Patient taking differently: Take 40 mg by mouth daily.) 30 tablet 0   clopidogrel (PLAVIX) 75 MG tablet Take 1 tablet by mouth daily.  clotrimazole-betamethasone (LOTRISONE) cream Apply 1 Application topically 2 (two) times daily. 30 g 2   ELIQUIS 5 MG TABS tablet TAKE 1 TABLET BY MOUTH TWICE A DAY 60 tablet 3   famotidine (PEPCID) 20 MG tablet Take 1 tablet (20 mg total) by mouth 2 (two) times daily as needed for heartburn or indigestion (itching).     ferrous sulfate 325 (65 FE) MG tablet Take 325 mg by mouth 2 (two) times daily with a meal.     glipiZIDE  (GLUCOTROL) 10 MG tablet Take 10 mg by mouth 2 (two) times daily.     hydrOXYzine (ATARAX) 25 MG tablet Take 25 mg by mouth 3 (three) times daily as needed for itching or anxiety.     latanoprost (XALATAN) 0.005 % ophthalmic solution Place 1 drop into both eyes at bedtime.     lisinopril (ZESTRIL) 40 MG tablet Take 40 mg by mouth daily.     metFORMIN (GLUCOPHAGE) 500 MG tablet Take 1 tablet by mouth 2 (two) times daily.     methocarbamol (ROBAXIN) 500 MG tablet TAKE 1 2 TABLETS (1,000 MG) BY ORAL ROUTE 4 TIMES PER DAY AS NEEDED FOR MUSCLE SPASM FOR 30 DAYS     metoprolol succinate (TOPROL-XL) 100 MG 24 hr tablet Take 100 mg by mouth daily.     metroNIDAZOLE (FLAGYL) 500 MG tablet TAKE 1 TABLET (500 MG) BY ORAL ROUTE 3 TIMES PER DAY FOR 14 DAYS FOR H PYLORI INFECTION     pantoprazole (PROTONIX) 40 MG tablet Take 40 mg by mouth 2 (two) times daily.     sucralfate (CARAFATE) 1 g tablet TAKE 1 TABLET (1 GRAM) BY ORAL ROUTE 2 TIMES PER DAY ON AN EMPTY STOMACH FOR ULCER FOR 90 DAYS     Tiotropium Bromide-Olodaterol (STIOLTO RESPIMAT) 2.5-2.5 MCG/ACT AERS Inhale 2 puffs into the lungs daily. 4 g 0   Tiotropium Bromide-Olodaterol (STIOLTO RESPIMAT) 2.5-2.5 MCG/ACT AERS Inhale 2 puffs into the lungs daily. 4 g 6   traMADol (ULTRAM) 50 MG tablet TAKE 1 TABLET (50 MG) BY ORAL ROUTE EVERY 6 HOURS AS NEEDED FOR LOW BACK PAIN FOR 5 DAYS     triamcinolone cream (KENALOG) 0.1 % Apply 1 application. topically daily as needed (itching).     No current facility-administered medications for this visit.    PHYSICAL EXAMINATION: ECOG PERFORMANCE STATUS: 1 - Symptomatic but completely ambulatory  Vitals:   07/03/22 1157  BP: 109/63  Pulse: 64  Resp: 15  Temp: 98.4 F (36.9 C)  SpO2: 98%   Wt Readings from Last 3 Encounters:  07/03/22 185 lb 1.6 oz (84 kg)  05/06/22 186 lb 3.2 oz (84.5 kg)  03/04/22 184 lb 3.2 oz (83.6 kg)     GENERAL:alert, no distress and comfortable SKIN: skin color normal, no rashes  or significant lesions EYES: normal, Conjunctiva are pink and non-injected, sclera clear  NEURO: alert & oriented x 3 with fluent speech   LABORATORY DATA:  I have reviewed the data as listed    Latest Ref Rng & Units 07/03/2022   11:15 AM 06/04/2022    9:45 AM 05/06/2022    9:45 AM  CBC  WBC 4.0 - 10.5 K/uL 6.9  6.2  5.4   Hemoglobin 13.0 - 17.0 g/dL 10.4  10.3  11.5   Hematocrit 39.0 - 52.0 % 33.3  31.4  35.5   Platelets 150 - 400 K/uL 265  227  234         Latest Ref Rng &  Units 08/06/2021    3:23 AM 08/05/2021    8:24 AM 08/04/2021    6:35 AM  CMP  Glucose 70 - 99 mg/dL 257  196  232   BUN 8 - 23 mg/dL 10  9  22    Creatinine 0.61 - 1.24 mg/dL 1.18  0.97  1.13   Sodium 135 - 145 mmol/L 137  139  138   Potassium 3.5 - 5.1 mmol/L 3.9  3.7  4.0   Chloride 98 - 111 mmol/L 109  112  109   CO2 22 - 32 mmol/L 22  22  22    Calcium 8.9 - 10.3 mg/dL 8.6  8.8  9.1   Total Protein 6.5 - 8.1 g/dL   6.6   Total Bilirubin 0.3 - 1.2 mg/dL   1.6   Alkaline Phos 38 - 126 U/L   69   AST 15 - 41 U/L   15   ALT 0 - 44 U/L   13       RADIOGRAPHIC STUDIES: I have personally reviewed the radiological images as listed and agreed with the findings in the report. No results found.    No orders of the defined types were placed in this encounter.  All questions were answered. The patient knows to call the clinic with any problems, questions or concerns. No barriers to learning was detected. The total time spent in the appointment was 20 minutes.     Truitt Merle, MD 07/03/2022   Felicity Coyer, CMA, am acting as scribe for Truitt Merle, MD.   I have reviewed the above documentation for accuracy and completeness, and I agree with the above.

## 2022-07-06 ENCOUNTER — Inpatient Hospital Stay: Payer: Medicare Other

## 2022-07-06 VITALS — BP 114/63 | HR 68 | Temp 98.8°F | Resp 16

## 2022-07-06 DIAGNOSIS — D500 Iron deficiency anemia secondary to blood loss (chronic): Secondary | ICD-10-CM

## 2022-07-06 MED ORDER — LORATADINE 10 MG PO TABS
10.0000 mg | ORAL_TABLET | Freq: Once | ORAL | Status: AC
  Administered 2022-07-06: 10 mg via ORAL
  Filled 2022-07-06: qty 1

## 2022-07-06 MED ORDER — SODIUM CHLORIDE 0.9 % IV SOLN
400.0000 mg | Freq: Once | INTRAVENOUS | Status: AC
  Administered 2022-07-06: 400 mg via INTRAVENOUS
  Filled 2022-07-06: qty 20

## 2022-07-06 MED ORDER — SODIUM CHLORIDE 0.9 % IV SOLN: INTRAVENOUS | Status: DC

## 2022-07-06 NOTE — Progress Notes (Signed)
Patient declined to stay for 30 minute observation. VSS and ambulatory at discharge.  

## 2022-07-06 NOTE — Patient Instructions (Signed)

## 2022-07-08 ENCOUNTER — Inpatient Hospital Stay: Payer: Medicare Other

## 2022-07-09 ENCOUNTER — Telehealth: Payer: Self-pay

## 2022-07-09 NOTE — Telephone Encounter (Addendum)
Called patient and made him aware of the message below as per Dr. Burr Medico. Also cancelled the remainder of his iron infusions.    ----- Message from Truitt Merle, MD sent at 07/06/2022  6:50 PM EDT ----- Let pt know his iron level is good, no need in iron for now, thx  YF

## 2022-07-17 ENCOUNTER — Inpatient Hospital Stay: Payer: Medicare Other

## 2022-08-05 ENCOUNTER — Inpatient Hospital Stay: Payer: Medicare Other

## 2022-08-16 ENCOUNTER — Emergency Department (HOSPITAL_COMMUNITY)
Admission: EM | Admit: 2022-08-16 | Discharge: 2022-08-17 | Disposition: A | Discharge status: Home / Self Care | Payer: Medicare Other | Attending: Emergency Medicine | Admitting: Emergency Medicine

## 2022-08-16 ENCOUNTER — Emergency Department (HOSPITAL_COMMUNITY): Payer: Medicare Other

## 2022-08-16 ENCOUNTER — Encounter (HOSPITAL_COMMUNITY): Payer: Self-pay

## 2022-08-16 ENCOUNTER — Other Ambulatory Visit: Payer: Self-pay

## 2022-08-16 DIAGNOSIS — N3001 Acute cystitis with hematuria: Secondary | ICD-10-CM

## 2022-08-16 DIAGNOSIS — R319 Hematuria, unspecified: Secondary | ICD-10-CM | POA: Diagnosis present

## 2022-08-16 DIAGNOSIS — N179 Acute kidney failure, unspecified: Secondary | ICD-10-CM

## 2022-08-16 LAB — CBC
HCT: 30.8 % — ABNORMAL LOW (ref 39.0–52.0)
Hemoglobin: 9.7 g/dL — ABNORMAL LOW (ref 13.0–17.0)
MCH: 26 pg (ref 26.0–34.0)
MCHC: 31.5 g/dL (ref 30.0–36.0)
MCV: 82.6 fL (ref 80.0–100.0)
Platelets: 257 10*3/uL (ref 150–400)
RBC: 3.73 MIL/uL — ABNORMAL LOW (ref 4.22–5.81)
RDW: 18.4 % — ABNORMAL HIGH (ref 11.5–15.5)
WBC: 11.1 10*3/uL — ABNORMAL HIGH (ref 4.0–10.5)
nRBC: 0 % (ref 0.0–0.2)

## 2022-08-16 LAB — URINALYSIS, W/ REFLEX TO CULTURE (INFECTION SUSPECTED)
Bilirubin Urine: NEGATIVE
Glucose, UA: 50 mg/dL — AB
Ketones, ur: NEGATIVE mg/dL
Nitrite: NEGATIVE
Protein, ur: 100 mg/dL — AB
RBC / HPF: >50 RBC/hpf (ref 0–5)
Specific Gravity, Urine: 1.017 (ref 1.005–1.030)
WBC, UA: >50 WBC/hpf (ref 0–5)
pH: 5 (ref 5.0–8.0)

## 2022-08-16 LAB — TYPE AND SCREEN
ABO/RH(D): O NEG
Antibody Screen: NEGATIVE

## 2022-08-16 LAB — DIFFERENTIAL
Abs Immature Granulocytes: 0.03 10*3/uL (ref 0.00–0.07)
Basophils Absolute: 0 10*3/uL (ref 0.0–0.1)
Basophils Relative: 0 %
Eosinophils Absolute: 0.1 10*3/uL (ref 0.0–0.5)
Eosinophils Relative: 1 %
Immature Granulocytes: 0 %
Lymphocytes Relative: 16 %
Lymphs Abs: 1.7 10*3/uL (ref 0.7–4.0)
Monocytes Absolute: 1.2 10*3/uL — ABNORMAL HIGH (ref 0.1–1.0)
Monocytes Relative: 11 %
Neutro Abs: 7.6 10*3/uL (ref 1.7–7.7)
Neutrophils Relative %: 72 %

## 2022-08-16 LAB — COMPREHENSIVE METABOLIC PANEL
ALT: 13 U/L (ref 0–44)
AST: 17 U/L (ref 15–41)
Albumin: 4.3 g/dL (ref 3.5–5.0)
Alkaline Phosphatase: 65 U/L (ref 38–126)
Anion gap: 10 (ref 5–15)
BUN: 28 mg/dL — ABNORMAL HIGH (ref 8–23)
CO2: 20 mmol/L — ABNORMAL LOW (ref 22–32)
Calcium: 9.2 mg/dL (ref 8.9–10.3)
Chloride: 103 mmol/L (ref 98–111)
Creatinine, Ser: 2.12 mg/dL — ABNORMAL HIGH (ref 0.61–1.24)
GFR, Estimated: 33 mL/min — ABNORMAL LOW (ref >60)
Glucose, Bld: 117 mg/dL — ABNORMAL HIGH (ref 70–99)
Potassium: 3.9 mmol/L (ref 3.5–5.1)
Sodium: 133 mmol/L — ABNORMAL LOW (ref 135–145)
Total Bilirubin: 0.7 mg/dL (ref 0.3–1.2)
Total Protein: 7.5 g/dL (ref 6.5–8.1)

## 2022-08-16 LAB — APTT: aPTT: 31 seconds (ref 24–36)

## 2022-08-16 LAB — PROTIME-INR
INR: 1.5 — ABNORMAL HIGH (ref 0.8–1.2)
Prothrombin Time: 17.7 seconds — ABNORMAL HIGH (ref 11.4–15.2)

## 2022-08-16 MED ORDER — LACTATED RINGERS IV BOLUS
1000.0000 mL | Freq: Once | INTRAVENOUS | Status: AC
  Administered 2022-08-16: 1000 mL via INTRAVENOUS

## 2022-08-16 MED ORDER — CEFPODOXIME PROXETIL 100 MG PO TABS: 100.0000 mg | ORAL_TABLET | Freq: Two times a day (BID) | ORAL | 0 refills | Status: AC

## 2022-08-16 MED ORDER — SODIUM CHLORIDE 0.9 % IV SOLN
1.0000 g | Freq: Once | INTRAVENOUS | Status: AC
  Administered 2022-08-16: 1 g via INTRAVENOUS
  Filled 2022-08-16: qty 10

## 2022-08-16 NOTE — Discharge Instructions (Addendum)
Thank you for coming to [facility] Emergency Department. You were seen for blood in your urine (hematuria). We did an exam, labs, and imaging, and these showed an acute kidney injury and urinary tract infection. You were treated with IV fluids and IV antibiotics while in the emergency department.  We will prescribe cefpodoxime, an antibiotic to take 100 mg twice per day for 7 days.  Please follow up with your primary care provider within 1 week.  You need to have your creatinine (indicator of kidney function) rechecked to ensure that it is downtrending.  Please stay well hydrated at home.  Do not hesitate to return to the ED or call 911 if you experience: -Worsening symptoms -Inability to urinate -Flank pain or abdominal pain -Lightheadedness, passing out -Fevers/chills -Anything else that concerns you;sue

## 2022-08-16 NOTE — ED Triage Notes (Signed)
C/o hematuria x3 days with painful urination.  Went to PCP and sent for hemoglobin of 6.5.  Pt currently on eliquis. Denies abd pain

## 2022-08-16 NOTE — ED Provider Notes (Signed)
Elijah Sandoval Provider Note   CSN: [id number] Arrival date & time: 08/16/22  1758     History  Chief Complaint  Patient presents with   Hematuria    Elijah Sandoval is a 72 y.o. male with HTN, history of CVA, HLD, history of DVT/PE on eliquis, T2DM, COPD presents with hematuria.   C/o hematuria x3 days with painful urination and frequency. Went to PCP and sent for hemoglobin of 6.5 measured from a finger stick sample. Pt currently on eliquis. Denies abd pain, flank pain, f/c. No h/o similar, has never received a blood transfusion.  No recent GU trauma or procedures.   Hematuria       Home Medications Prior to Admission medications   Medication Sig Start Date End Date Taking? Authorizing Provider  amLODipine (NORVASC) 5 MG tablet Take 5 mg by mouth daily. 05/05/20   [provider]  atorvastatin (LIPITOR) 40 MG tablet Take 1 tablet (40 mg total) by mouth daily at 6 PM. Patient taking differently: Take 40 mg by mouth daily. 01/20/12   Joseph Art, DO  clopidogrel (PLAVIX) 75 MG tablet Take 1 tablet by mouth daily.    [provider]  clotrimazole-betamethasone (LOTRISONE) cream Apply 1 Application topically 2 (two) times daily. 05/21/22   Edwin Cap, DPM  ELIQUIS 5 MG TABS tablet TAKE 1 TABLET BY MOUTH TWICE A DAY 05/16/22   Maeola Harman, MD  famotidine (PEPCID) 20 MG tablet Take 1 tablet (20 mg total) by mouth 2 (two) times daily as needed for heartburn or indigestion (itching). 08/06/21   Glade Lloyd, MD  ferrous sulfate 325 (65 FE) MG tablet Take 325 mg by mouth 2 (two) times daily with a meal. 12/27/19   [provider]  glipiZIDE (GLUCOTROL) 10 MG tablet Take 10 mg by mouth 2 (two) times daily. 02/22/20   [provider]  hydrOXYzine (ATARAX) 25 MG tablet Take 25 mg by mouth 3 (three) times daily as needed for itching or anxiety.    [provider]  latanoprost (XALATAN)  0.005 % ophthalmic solution Place 1 drop into both eyes at bedtime. 04/17/20   [provider]  lisinopril (ZESTRIL) 40 MG tablet Take 40 mg by mouth daily.    [provider]  metFORMIN (GLUCOPHAGE) 500 MG tablet Take 1 tablet by mouth 2 (two) times daily.    [provider]  methocarbamol (ROBAXIN) 500 MG tablet TAKE 1 2 TABLETS (1,000 MG) BY ORAL ROUTE 4 TIMES PER DAY AS NEEDED FOR MUSCLE SPASM FOR 30 DAYS    [provider]  metoprolol succinate (TOPROL-XL) 100 MG 24 hr tablet Take 100 mg by mouth daily. 12/23/18   [provider]  metroNIDAZOLE (FLAGYL) 500 MG tablet TAKE 1 TABLET (500 MG) BY ORAL ROUTE 3 TIMES PER DAY FOR 14 DAYS FOR H PYLORI INFECTION    [provider]  pantoprazole (PROTONIX) 40 MG tablet Take 40 mg by mouth 2 (two) times daily. 12/18/19   [provider]  sucralfate (CARAFATE) 1 g tablet TAKE 1 TABLET (1 GRAM) BY ORAL ROUTE 2 TIMES PER DAY ON AN EMPTY STOMACH FOR ULCER FOR 90 DAYS    [provider]  Tiotropium Bromide-Olodaterol (STIOLTO RESPIMAT) 2.5-2.5 MCG/ACT AERS Inhale 2 puffs into the lungs daily. 08/02/21   Parrett, Virgel Bouquet, NP  Tiotropium Bromide-Olodaterol (STIOLTO RESPIMAT) 2.5-2.5 MCG/ACT AERS Inhale 2 puffs into the lungs daily. 09/05/21   Parrett, Virgel Bouquet, NP  traMADol (ULTRAM) 50 MG tablet TAKE 1 TABLET (50 MG) BY ORAL ROUTE EVERY 6 HOURS AS NEEDED FOR LOW BACK PAIN FOR 5 DAYS    [provider]  triamcinolone cream (KENALOG) 0.1 % Apply 1 application. topically daily as needed (itching). 08/06/21   Glade Lloyd, MD      Allergies    Latex    Review of Systems   Review of Systems  Genitourinary:  Positive for hematuria.   Review of systems Negative for f/c.  A 10 point review of systems was performed and is negative unless otherwise reported in HPI.  Physical Exam Updated Vital Signs BP (!) 97/54   Pulse 84   Temp 98.5 F (36.9 C)   Resp 20   Wt 84 kg   SpO2 96%   BMI  23.15 kg/m  Physical Exam General: Normal appearing male, lying in bed.  HEENT: Sclera anicteric, MMM, trachea midline.  Cardiology: RRR, no murmurs/rubs/gallops.  Resp: Normal respiratory rate and effort. CTAB, no wheezes, rhonchi, crackles.  Abd: Soft, non-tender, non-distended. No rebound tenderness or guarding.  GU: Normal appearing circumcised penis with no wounds or active bleeding from urethral. No TTP of penis or bilateral testicles/scrotum. MSK: No peripheral edema or signs of trauma.  Skin: warm, dry.  Back: No CVA tenderness Neuro: A&Ox4, CNs II-XII grossly intact. MAEs. Sensation grossly intact.  Psych: Normal mood and affect.   ED Results / Procedures / Treatments   Labs (all labs ordered are listed, but only abnormal results are displayed) Labs Reviewed  CBC - Abnormal; Notable for the following components:      Result Value   WBC 11.1 (*)    RBC 3.73 (*)    Hemoglobin 9.7 (*)    HCT 30.8 (*)    RDW 18.4 (*)    All other components within normal limits  COMPREHENSIVE METABOLIC PANEL  TYPE AND SCREEN    EKG None  Radiology No results found.  Procedures Procedures    Medications Ordered in ED Medications  lactated ringers bolus 1,000 mL (0 mLs Intravenous Stopped 08/16/22 2312)  cefTRIAXone (ROCEPHIN) 1 g in sodium chloride 0.9 % 100 mL IVPB (0 g Intravenous Stopped 08/17/22 0030)    ED Course/ Medical Decision Making/ A&P                          Medical Decision Making Amount and/or Complexity of Data Reviewed Labs: ordered. Decision-making details documented in ED Course. Radiology: ordered. Decision-making details documented in ED Course.  Risk Prescription drug management.    This patient presents to the ED for concern of hematuria, this involves an extensive number of treatment options, and is a complaint that carries with it a high risk of complications and morbidity. However patient is overall well-appearing, HDS, afebrile.  MDM:     Differential diagnosis for this patient's hematuria includes but is not limited to: Consider UTI top of differential for patient given urgency/frequency/dysuria associated with hematuria. Also consider  nephrolithiasis, malignancy, BPH, prostatitis.  No recent GU trauma or procedures to indicate iatrogenic cause.  Consider also glomerular causes include glomerulonephritis, or non-glomerular causes include interstitial nephritis, pyelonephritis, papillary necrosis (SCD, NSAID use), vascular, malignancy. He has no flank pain but he is demonstarted to have AKI on his labs up to Cr 2.12 from BL 1, so will obtain CT renal stone to look at kidneys  -He doesn't take pharmacologic anticoagulation   Patient sent for Hgb 6.5, c/f  need for blood transfusion, retest here demonstrates Hgb 9.7 which is close to patient's baseline. This is likely an error of the fingerstick test used earlier today. Patient is relieved, no need for transfusion today. Can f/u for his chronic anemia as o/p w/ PCP.   Clinical Course as of 08/17/22 0024  Fri Aug 16, 2022  1905 Hemoglobin(!): 9.7 [HN]  1905 WBC(!): 11.1 [HN]  2008 Creatinine(!): 2.12 Significant AKI, up from BL ~1 [HN]  2008 BUN(!): 28 [HN]  2008 CO2(!): 20 [HN]  2040 INR(!): 1.5 [HN]  2241 CT Renal Stone Study 1. No acute intra-abdominal pathology identified. No definite radiographic explanation for the patient's reported symptoms. 2. Stable subtle cystic lesion within the uncinate process of the pancreas. 3.  Aortic Atherosclerosis (ICD10-I70.0).   [HN]  2343 D/w patient who states that he can f/u with his PCP on Monday to have labs rechecked. I informed him of his AKI and urged him to have labs rechecked. He had reassuring CT renal stone with no obstructive uropathy or nephrolithiasis/ureterolithiasis. S/p 1L IVF and IV ceftriaxone, will prescribe cefpodoxime for o/p abx. Will have labs rechecked on Monday to ensure creatinine is downtrending. [HN]     Clinical Course User Index [HN] Loetta Rough, MD    Labs: I Ordered, and personally interpreted labs.  The pertinent results include:  those listed above  Imaging Studies ordered: I ordered imaging studies including CT renal stone I independently visualized and interpreted imaging. I agree with the radiologist interpretation  Additional history obtained from chart review.    Reevaluation: After the interventions noted above, I reevaluated the patient and found that they have :improved  Social Determinants of Health: Patient lives independently   Disposition:  DC w/ discharge instructions/return precautions, cefpodoxime for UTI, urine culture pending, and PCP f/u for lab recheck for AKI. All questions answered to patient's satisfaction.    Co morbidities that complicate the patient evaluation  Past Medical History:  Diagnosis Date   COPD (chronic obstructive pulmonary disease) (HCC)    Diabetes mellitus    Stroke (HCC)    Pt reports TIA in 2013   Transfusion history      Medicines No orders of the defined types were placed in this encounter.   I have reviewed the patients home medicines and have made adjustments as needed  Problem List / ED Course: Problem List Items Addressed This Visit       Genitourinary   AKI (acute kidney injury) (HCC)   Other Visit Diagnoses     Acute cystitis with hematuria    -  Primary                   This note was created using dictation software, which may contain spelling or grammatical errors.    Loetta Rough, MD 08/24/22 [phone]

## 2022-08-19 ENCOUNTER — Ambulatory Visit: Payer: Medicare Other | Admitting: Podiatry

## 2022-08-20 LAB — URINE CULTURE: Culture: >=100000 — AB

## 2022-08-21 ENCOUNTER — Telehealth (HOSPITAL_BASED_OUTPATIENT_CLINIC_OR_DEPARTMENT_OTHER): Payer: Self-pay | Admitting: Emergency Medicine

## 2022-08-21 NOTE — Telephone Encounter (Signed)
Post ED Visit - Positive Culture Follow-up  Culture report reviewed by antimicrobial stewardship pharmacist: [facility] []  Enzo Bi, Pharm.D. []  Celedonio Miyamoto, Pharm.D., BCPS AQ-ID []  Garvin Fila, Pharm.D., BCPS [x]  Georgina Pillion, Pharm.D., BCPS []  [place], [address].D., BCPS, AAHIVP []  Estella Husk, Pharm.D., BCPS, AAHIVP []  Lysle Pearl, PharmD, BCPS []  Phillips Climes, PharmD, BCPS []  Agapito Games, PharmD, BCPS []  Verlan Friends, PharmD []  Mervyn Gay, PharmD, BCPS []  Vinnie Level, PharmD  [facility] []  Len Childs, PharmD []  Greer Pickerel, PharmD []  Adalberto Cole, PharmD []  Perlie Gold, Rph []  Lonell Face) Jean Rosenthal, PharmD []  Earl Many, PharmD []  Junita Push, PharmD []  Dorna Leitz, PharmD []  Terrilee Files, PharmD []  Lynann Beaver, PharmD []  Keturah Barre, PharmD []  Loralee Pacas, PharmD []  Bernadene Person, PharmD   Positive urine culture Treated with Cefpodoxime, organism sensitive to the same and no further patient follow-up is required at this time.  Carollee Herter C Eliakim Tendler 08/21/2022, 10:30 AM

## 2022-08-28 ENCOUNTER — Ambulatory Visit: Payer: Medicare Other | Admitting: Podiatry

## 2022-09-02 ENCOUNTER — Inpatient Hospital Stay: Payer: Medicare Other

## 2022-09-02 ENCOUNTER — Inpatient Hospital Stay: Payer: Medicare Other | Admitting: Nurse Practitioner

## 2022-09-02 ENCOUNTER — Other Ambulatory Visit: Payer: Self-pay

## 2022-09-02 NOTE — Progress Notes (Deleted)
Patient Care Team: Raymon Mutton., FNP as PCP - General (Family Medicine) Kathi Der, MD as Consulting Physician (Gastroenterology) Malachy Mood, MD as Consulting Physician (Hematology) Tomma Lightning, MD as Consulting Physician (Pulmonary Disease) Cephus Shelling, MD as Consulting Physician (Vascular Surgery)   CHIEF COMPLAINT: Follow up IDA  Oncology History   No history exists.     CURRENT THERAPY: IV Venofer 400 mg PRN, if ferritin <30, starting 12/2021; restarted oral iron 06/2022  INTERVAL HISTORY  [name] returns for follow up as scheduled. Last seen by Dr. Mosetta Putt 07/03/22. Most Recent IV Venofer 3/9 and 3/23. Developed hematuria with painful urination and frequency, went to PCP found to have hgb 6.5 on fingerstick and sent to ED. Labs showed hgb 9.7 AKI, CT renal stone study was negative. He was given IVF and abx for E.coli+ UTI  ROS   Past Medical History:  Diagnosis Date   COPD (chronic obstructive pulmonary disease) (HCC)    Diabetes mellitus    Stroke (HCC)    Pt reports TIA in 2013   Transfusion history      Past Surgical History:  Procedure Laterality Date   ESOPHAGOGASTRODUODENOSCOPY (EGD) WITH PROPOFOL N/A 08/05/2021   Procedure: ESOPHAGOGASTRODUODENOSCOPY (EGD) WITH PROPOFOL;  Surgeon: Kerin Salen, MD;  Location: [facility];  Service: Gastroenterology;  Laterality: N/A;   GIVENS CAPSULE STUDY N/A 08/05/2021   Procedure: GIVENS CAPSULE STUDY;  Surgeon: Kerin Salen, MD;  Location: [facility];  Service: Gastroenterology;  Laterality: N/A;   HOT HEMOSTASIS N/A 08/05/2021   Procedure: HOT HEMOSTASIS (ARGON PLASMA COAGULATION/BICAP);  Surgeon: Kerin Salen, MD;  Location: [facility];  Service: Gastroenterology;  Laterality: N/A;   NO PAST SURGERIES       Outpatient Encounter Medications as of 09/02/2022  Medication Sig   amLODipine (NORVASC) 5 MG tablet Take 5 mg by mouth daily.   atorvastatin (LIPITOR) 40 MG tablet Take 1 tablet (40 mg total)  by mouth daily at 6 PM. (Patient taking differently: Take 40 mg by mouth daily.)   clopidogrel (PLAVIX) 75 MG tablet Take 1 tablet by mouth daily.   clotrimazole-betamethasone (LOTRISONE) cream Apply 1 Application topically 2 (two) times daily.   ELIQUIS 5 MG TABS tablet TAKE 1 TABLET BY MOUTH TWICE A DAY   famotidine (PEPCID) 20 MG tablet Take 1 tablet (20 mg total) by mouth 2 (two) times daily as needed for heartburn or indigestion (itching).   ferrous sulfate 325 (65 FE) MG tablet Take 325 mg by mouth 2 (two) times daily with a meal.   glipiZIDE (GLUCOTROL) 10 MG tablet Take 10 mg by mouth 2 (two) times daily.   hydrOXYzine (ATARAX) 25 MG tablet Take 25 mg by mouth 3 (three) times daily as needed for itching or anxiety.   latanoprost (XALATAN) 0.005 % ophthalmic solution Place 1 drop into both eyes at bedtime.   lisinopril (ZESTRIL) 40 MG tablet Take 40 mg by mouth daily.   metFORMIN (GLUCOPHAGE) 500 MG tablet Take 1 tablet by mouth 2 (two) times daily.   methocarbamol (ROBAXIN) 500 MG tablet TAKE 1 2 TABLETS (1,000 MG) BY ORAL ROUTE 4 TIMES PER DAY AS NEEDED FOR MUSCLE SPASM FOR 30 DAYS   metoprolol succinate (TOPROL-XL) 100 MG 24 hr tablet Take 100 mg by mouth daily.   metroNIDAZOLE (FLAGYL) 500 MG tablet TAKE 1 TABLET (500 MG) BY ORAL ROUTE 3 TIMES PER DAY FOR 14 DAYS FOR H PYLORI INFECTION   pantoprazole (PROTONIX) 40 MG tablet Take 40  mg by mouth 2 (two) times daily.   sucralfate (CARAFATE) 1 g tablet TAKE 1 TABLET (1 GRAM) BY ORAL ROUTE 2 TIMES PER DAY ON AN EMPTY STOMACH FOR ULCER FOR 90 DAYS   Tiotropium Bromide-Olodaterol (STIOLTO RESPIMAT) 2.5-2.5 MCG/ACT AERS Inhale 2 puffs into the lungs daily.   Tiotropium Bromide-Olodaterol (STIOLTO RESPIMAT) 2.5-2.5 MCG/ACT AERS Inhale 2 puffs into the lungs daily.   traMADol (ULTRAM) 50 MG tablet TAKE 1 TABLET (50 MG) BY ORAL ROUTE EVERY 6 HOURS AS NEEDED FOR LOW BACK PAIN FOR 5 DAYS   triamcinolone cream (KENALOG) 0.1 % Apply 1 application.  topically daily as needed (itching).   No facility-administered encounter medications on file as of 09/02/2022.     There were no vitals filed for this visit. There is no height or weight on file to calculate BMI.   PHYSICAL EXAM GENERAL:alert, no distress and comfortable SKIN: no rash  EYES: sclera clear NECK: without mass LYMPH:  no palpable cervical or supraclavicular lymphadenopathy  LUNGS: clear with normal breathing effort HEART: regular rate & rhythm, no lower extremity edema ABDOMEN: abdomen soft, non-tender and normal bowel sounds NEURO: alert & oriented x 3 with fluent speech, no focal motor/sensory deficits Breast exam:  PAC without erythema    CBC    Component Value Date/Time   WBC 11.1 (H) 08/16/2022 1839   RBC 3.73 (L) 08/16/2022 1839   HGB 9.7 (L) 08/16/2022 1839   HCT 30.8 (L) 08/16/2022 1839   PLT 257 08/16/2022 1839   MCV 82.6 08/16/2022 1839   MCH 26.0 08/16/2022 1839   MCHC 31.5 08/16/2022 1839   RDW 18.4 (H) 08/16/2022 1839   LYMPHSABS 1.7 08/16/2022 1927   MONOABS 1.2 (H) 08/16/2022 1927   EOSABS 0.1 08/16/2022 1927   BASOSABS 0.0 08/16/2022 1927     CMP     Component Value Date/Time   NA 133 (L) 08/16/2022 1839   K 3.9 08/16/2022 1839   CL 103 08/16/2022 1839   CO2 20 (L) 08/16/2022 1839   GLUCOSE 117 (H) 08/16/2022 1839   BUN 28 (H) 08/16/2022 1839   CREATININE 2.12 (H) 08/16/2022 1839   CALCIUM 9.2 08/16/2022 1839   PROT 7.5 08/16/2022 1839   ALBUMIN 4.3 08/16/2022 1839   AST 17 08/16/2022 1839   ALT 13 08/16/2022 1839   ALKPHOS 65 08/16/2022 1839   BILITOT 0.7 08/16/2022 1839   GFRNONAA 33 (L) 08/16/2022 1839   GFRAA >60 02/25/2019 1059     ASSESSMENT & PLAN:  PLAN:  No orders of the defined types were placed in this encounter.     All questions were answered. The patient knows to call the clinic with any problems, questions or concerns. No barriers to learning were detected. I spent *** counseling the patient face to face.  The total time spent in the appointment was *** and more than 50% was on counseling, review of test results, and coordination of care.   Santiago Glad, NP-C @DATE @

## 2022-09-11 NOTE — Progress Notes (Signed)
[facility] OFFICE PROGRESS NOTE  Raymon Mutton., FNP [address] [place] [address]  DIAGNOSIS: f/u of anemia   CURRENT THERAPY: IV Venofer 400mg , as needed (if ferritn<30) since 12/2021. Most recent dose on 07/06/22  INTERVAL HISTORY: Elijah Sandoval 72 y.o. male returns to the clinic today for a follow-up visit. The patient is followed for his history of anemia  He was last seen in clinic by Dr. Mosetta Putt on March 20th, 2024.  The patient is feeling fairly well today without any concerning complaints.  The patient had a scare earlier this month on 08/16/2022 when he was at his PCPs office and had a fingerstick H&H performed showing hemoglobin of 6.5.  Therefore, the patient subsequently went to the emergency room.  His actual hemoglobin was 9.7.  Regarding his anemia, the patient had a most recent colonoscopy and EGD on 11/22 and 4/23 respectively.  He also had a capsule endoscopy on 5/23.  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  The patient currently receives IV iron with Venofer 400 mg x 3 as needed his most recent dose was in March 2023.  He takes an iron supplement daily and is compliant with this. Of note the patient is currently on Eliquis.   Since last being seen the patient denies any major changes in his health.  He reports his fatigue is improving.  He denies any visible abnormal bleeding or bruising to his knowledge.  He denies shortness of breath, lightheadedness, or syncope he is here today for evaluation repeat blood work     MEDICAL HISTORY: Past Medical History:  Diagnosis Date   COPD (chronic obstructive pulmonary disease) (HCC)    Diabetes mellitus    Stroke (HCC)    Pt reports TIA in 2013   Transfusion history     ALLERGIES:  is allergic to latex.  MEDICATIONS:  Current Outpatient Medications  Medication Sig Dispense Refill   amLODipine (NORVASC) 5 MG tablet Take 5 mg by mouth daily.     atorvastatin (LIPITOR) 40 MG  tablet Take 1 tablet (40 mg total) by mouth daily at 6 PM. (Patient taking differently: Take 40 mg by mouth daily.) 30 tablet 0   clopidogrel (PLAVIX) 75 MG tablet Take 1 tablet by mouth daily.     clotrimazole-betamethasone (LOTRISONE) cream Apply 1 Application topically 2 (two) times daily. 30 g 2   ELIQUIS 5 MG TABS tablet TAKE 1 TABLET BY MOUTH TWICE A DAY 60 tablet 3   famotidine (PEPCID) 20 MG tablet Take 1 tablet (20 mg total) by mouth 2 (two) times daily as needed for heartburn or indigestion (itching).     ferrous sulfate 325 (65 FE) MG tablet Take 325 mg by mouth 2 (two) times daily with a meal.     glipiZIDE (GLUCOTROL) 10 MG tablet Take 10 mg by mouth 2 (two) times daily.     hydrOXYzine (ATARAX) 25 MG tablet Take 25 mg by mouth 3 (three) times daily as needed for itching or anxiety.     latanoprost (XALATAN) 0.005 % ophthalmic solution Place 1 drop into both eyes at bedtime.     lisinopril (ZESTRIL) 40 MG tablet Take 40 mg by mouth daily.     metFORMIN (GLUCOPHAGE) 500 MG tablet Take 1 tablet by mouth 2 (two) times daily.     methocarbamol (ROBAXIN) 500 MG tablet TAKE 1 2 TABLETS (1,000 MG) BY ORAL ROUTE 4 TIMES PER DAY AS NEEDED FOR MUSCLE SPASM FOR  30 DAYS     metoprolol succinate (TOPROL-XL) 100 MG 24 hr tablet Take 100 mg by mouth daily.     metroNIDAZOLE (FLAGYL) 500 MG tablet TAKE 1 TABLET (500 MG) BY ORAL ROUTE 3 TIMES PER DAY FOR 14 DAYS FOR H PYLORI INFECTION     pantoprazole (PROTONIX) 40 MG tablet Take 40 mg by mouth 2 (two) times daily.     sucralfate (CARAFATE) 1 g tablet TAKE 1 TABLET (1 GRAM) BY ORAL ROUTE 2 TIMES PER DAY ON AN EMPTY STOMACH FOR ULCER FOR 90 DAYS     Tiotropium Bromide-Olodaterol (STIOLTO RESPIMAT) 2.5-2.5 MCG/ACT AERS Inhale 2 puffs into the lungs daily. 4 g 0   Tiotropium Bromide-Olodaterol (STIOLTO RESPIMAT) 2.5-2.5 MCG/ACT AERS Inhale 2 puffs into the lungs daily. 4 g 6   traMADol (ULTRAM) 50 MG tablet TAKE 1 TABLET (50 MG) BY ORAL ROUTE EVERY 6  HOURS AS NEEDED FOR LOW BACK PAIN FOR 5 DAYS     triamcinolone cream (KENALOG) 0.1 % Apply 1 application. topically daily as needed (itching).     No current facility-administered medications for this visit.    SURGICAL HISTORY:  Past Surgical History:  Procedure Laterality Date   ESOPHAGOGASTRODUODENOSCOPY (EGD) WITH PROPOFOL N/A 08/05/2021   Procedure: ESOPHAGOGASTRODUODENOSCOPY (EGD) WITH PROPOFOL;  Surgeon: Kerin Salen, MD;  Location: [facility];  Service: Gastroenterology;  Laterality: N/A;   GIVENS CAPSULE STUDY N/A 08/05/2021   Procedure: GIVENS CAPSULE STUDY;  Surgeon: Kerin Salen, MD;  Location: [facility];  Service: Gastroenterology;  Laterality: N/A;   HOT HEMOSTASIS N/A 08/05/2021   Procedure: HOT HEMOSTASIS (ARGON PLASMA COAGULATION/BICAP);  Surgeon: Kerin Salen, MD;  Location: [facility];  Service: Gastroenterology;  Laterality: N/A;   NO PAST SURGERIES      REVIEW OF SYSTEMS:   Review of Systems  Constitutional:  Positive for improving fatigue. Negative for appetite change, chills,  fever and unexpected weight change.  HENT: Negative for mouth sores, nosebleeds, sore throat and trouble swallowing.   Eyes: Negative for eye problems and icterus.  Respiratory: Negative for cough, hemoptysis, shortness of breath and wheezing.   Cardiovascular: Negative for chest pain and leg swelling.  Gastrointestinal: Negative for abdominal pain, constipation, diarrhea, nausea and vomiting.  Genitourinary: Negative for bladder incontinence, difficulty urinating, dysuria, frequency and hematuria.   Musculoskeletal: Negative for back pain, gait problem, neck pain and neck stiffness.  Skin: Negative for itching and rash.  Neurological: Negative for dizziness, extremity weakness, gait problem, headaches, light-headedness and seizures.  Hematological: Negative for adenopathy. Does not bruise/bleed easily.  Psychiatric/Behavioral: Negative for confusion, depression and sleep disturbance. The  patient is not nervous/anxious.     PHYSICAL EXAMINATION:  Blood pressure 129/67, pulse 72, temperature 98.3 F (36.8 C), resp. rate 18, weight 185 lb 6.4 oz (84.1 kg), SpO2 99 %.  ECOG PERFORMANCE STATUS: 1  Physical Exam  Constitutional: Oriented to person, place, and time and well-developed, well-nourished, and in no distress.  HENT:  Head: Normocephalic and atraumatic.  Mouth/Throat: Oropharynx is clear and moist. No oropharyngeal exudate.  Eyes: Conjunctivae are normal. Right eye exhibits no discharge. Left eye exhibits no discharge. No scleral icterus.  Neck: Normal range of motion. Neck supple.  Cardiovascular: Normal rate, regular rhythm, normal heart sounds and intact distal pulses.   Pulmonary/Chest: Effort normal and breath sounds normal. No respiratory distress. No wheezes. No rales.  Abdominal: Soft. Bowel sounds are normal. Exhibits no distension and no mass. There is no tenderness.  Musculoskeletal: Normal range of motion. Exhibits  no edema.  Lymphadenopathy:    No cervical adenopathy.  Neurological: Alert and oriented to person, place, and time. Exhibits normal muscle tone. Gait normal. Coordination normal.  Skin: Skin is warm and dry. No rash noted. Not diaphoretic. No erythema. No pallor.  Psychiatric: Mood, memory and judgment normal.  Vitals reviewed.  LABORATORY DATA: Lab Results  Component Value Date   WBC 6.1 09/13/2022   HGB 10.1 (L) 09/13/2022   HCT 32.8 (L) 09/13/2022   MCV 79.4 (L) 09/13/2022   PLT 193 09/13/2022      Chemistry      Component Value Date/Time   NA 133 (L) 08/16/2022 1839   K 3.9 08/16/2022 1839   CL 103 08/16/2022 1839   CO2 20 (L) 08/16/2022 1839   BUN 28 (H) 08/16/2022 1839   CREATININE 2.12 (H) 08/16/2022 1839      Component Value Date/Time   CALCIUM 9.2 08/16/2022 1839   ALKPHOS 65 08/16/2022 1839   AST 17 08/16/2022 1839   ALT 13 08/16/2022 1839   BILITOT 0.7 08/16/2022 1839       RADIOGRAPHIC STUDIES:  CT  Renal Stone Study  Result Date: 08/16/2022 CLINICAL DATA:  Abdominal/flank pain, stone suspected, hematuria EXAM: CT ABDOMEN AND PELVIS WITHOUT CONTRAST TECHNIQUE: Multidetector CT imaging of the abdomen and pelvis was performed following the standard protocol without IV contrast. RADIATION DOSE REDUCTION: This exam was performed according to the departmental dose-optimization program which includes automated exposure control, adjustment of the mA and/or kV according to patient size and/or use of iterative reconstruction technique. 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 This appears stable since remote prior examination of 02/01/2020. No acute abnormality. Hepatobiliary: No focal liver abnormality is seen. No gallstones, gallbladder wall thickening, or biliary dilatation. Pancreas: 11 x 18 mm subtle low-attenuation lesion is seen within the uncinate process, similar to that noted on prior examination of 02/20/2021 though not optimally visualized. The pancreas is otherwise unremarkable. Spleen: Unremarkable Adrenals/Urinary Tract: Adrenal glands are unremarkable. Kidneys are normal, without renal calculi, focal lesion, or hydronephrosis. Bladder is unremarkable. Stomach/Bowel: Stomach is within normal limits. Appendix appears normal. No evidence of bowel wall thickening, distention, or inflammatory changes. Vascular/Lymphatic: Extensive aortoiliac atherosclerotic calcification. No aortic aneurysm. No pathologic adenopathy within the abdomen and pelvis. Reproductive: Prostate is unremarkable. Other: No abdominal wall hernia or abnormality. No abdominopelvic ascites. Musculoskeletal: Degenerative changes are seen within the lumbar spine. No acute bone abnormality. No lytic or blastic bone lesion. IMPRESSION:  1. No acute intra-abdominal pathology identified. No definite radiographic explanation for the patient's reported symptoms. 2. Stable subtle cystic lesion within the uncinate process of the pancreas. 3.  Aortic Atherosclerosis (ICD10-I70.0). Electronically Signed   By: Helyn Numbers M.D.   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 His Hg has been in 6-9 g/dl range in the past year and again required blood transfusion in 07/2021 -most recent colonoscopy 02/2021, EGD 08/05/21, and capsule endoscopy 08/13/21. 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 -he received iv Venofer 400mg  X3 from 12/31/21 - 01/21/22, and again in Dec 2023 -he responded well, H/H improved  -He developed anemia again a few months after last dose of IV iron,  and restart IV iron a few months ago - Most recent IV iron 07/06/22. -Will monitor his lab monthly -If he continues to require frequent IV iron, Dr. Mosetta Putt may schedule his IV iron regularly every 4-8 weeks  -Labs were reviewed today. Hbg 10.1, ferritin pending.  Per Dr. Latanya Maudlin note, will arrange IV iron with Venofer 400 mg x 3 if ferritin~<30.  -Call the patient with results today or Monday and will arrange IV iron if needed, we can start next week -Regardless if he receives IV iron or not we will arrange for monthly labs and lab and follow-up visit with Dr. Mosetta Putt in 3 months    PLAN: -lab reviewed- hg 10.1 -Lab monthly -Follow-up in 3 months -We will call the patient with results of ferritin today or Monday and arrange IV iron if needed if ferritin~<30      No orders of the defined types were placed in this encounter.    The total time spent in the appointment was 20-29 minutes  Arali Somera L Mehtab Dolberry, PA-C 09/13/22

## 2022-09-13 ENCOUNTER — Inpatient Hospital Stay: Payer: Medicare Other | Admitting: Physician Assistant

## 2022-09-13 ENCOUNTER — Telehealth: Payer: Self-pay | Admitting: Physician Assistant

## 2022-09-13 ENCOUNTER — Other Ambulatory Visit: Payer: Self-pay | Admitting: Physician Assistant

## 2022-09-13 ENCOUNTER — Inpatient Hospital Stay: Payer: Medicare Other | Attending: Internal Medicine

## 2022-09-13 VITALS — BP 129/67 | HR 72 | Temp 98.3°F | Resp 18 | Wt 185.4 lb

## 2022-09-13 DIAGNOSIS — D649 Anemia, unspecified: Secondary | ICD-10-CM | POA: Diagnosis not present

## 2022-09-13 DIAGNOSIS — D500 Iron deficiency anemia secondary to blood loss (chronic): Secondary | ICD-10-CM | POA: Diagnosis present

## 2022-09-13 LAB — CBC WITH DIFFERENTIAL/PLATELET
Abs Immature Granulocytes: 0.02 10*3/uL (ref 0.00–0.07)
Basophils Absolute: 0 10*3/uL (ref 0.0–0.1)
Basophils Relative: 0 %
Eosinophils Absolute: 0.3 10*3/uL (ref 0.0–0.5)
Eosinophils Relative: 4 %
HCT: 32.8 % — ABNORMAL LOW (ref 39.0–52.0)
Hemoglobin: 10.1 g/dL — ABNORMAL LOW (ref 13.0–17.0)
Immature Granulocytes: 0 %
Lymphocytes Relative: 25 %
Lymphs Abs: 1.5 10*3/uL (ref 0.7–4.0)
MCH: 24.5 pg — ABNORMAL LOW (ref 26.0–34.0)
MCHC: 30.8 g/dL (ref 30.0–36.0)
MCV: 79.4 fL — ABNORMAL LOW (ref 80.0–100.0)
Monocytes Absolute: 0.6 10*3/uL (ref 0.1–1.0)
Monocytes Relative: 10 %
Neutro Abs: 3.7 10*3/uL (ref 1.7–7.7)
Neutrophils Relative %: 61 %
Platelets: 193 10*3/uL (ref 150–400)
RBC: 4.13 MIL/uL — ABNORMAL LOW (ref 4.22–5.81)
RDW: 17.5 % — ABNORMAL HIGH (ref 11.5–15.5)
WBC: 6.1 10*3/uL (ref 4.0–10.5)
nRBC: 0 % (ref 0.0–0.2)

## 2022-09-13 LAB — FERRITIN: Ferritin: 6 ng/mL — ABNORMAL LOW (ref 24–336)

## 2022-09-13 NOTE — Telephone Encounter (Signed)
Called the patient to let him know that his ferritin was low (6).  Therefore, he needs IV iron infusions.  I have placed the orders and we will send a message to the scheduling team.  I called the patient to let him know that someone from the scheduling team should be in touch with him early next week to arrange for IV iron.  He expressed understanding.  We will continue to monitor his labs monthly and arrange for IV iron if his ferritin is less than 30.  We will arrange for labs and follow-up visit in 3 months.

## 2022-09-19 ENCOUNTER — Other Ambulatory Visit: Payer: Self-pay | Admitting: Vascular Surgery

## 2022-09-20 ENCOUNTER — Inpatient Hospital Stay: Payer: Medicare Other | Attending: Internal Medicine

## 2022-09-20 VITALS — BP 132/61 | HR 57 | Temp 98.0°F | Resp 18

## 2022-09-20 DIAGNOSIS — D500 Iron deficiency anemia secondary to blood loss (chronic): Secondary | ICD-10-CM | POA: Diagnosis present

## 2022-09-20 MED ORDER — HEPARIN SOD (PORK) LOCK FLUSH 100 UNIT/ML IV SOLN: 500.0000 [IU] | Freq: Once | INTRAVENOUS | Status: DC | PRN

## 2022-09-20 MED ORDER — HEPARIN SOD (PORK) LOCK FLUSH 100 UNIT/ML IV SOLN: 250.0000 [IU] | Freq: Once | INTRAVENOUS | Status: DC | PRN

## 2022-09-20 MED ORDER — SODIUM CHLORIDE 0.9% FLUSH: 3.0000 mL | Freq: Once | INTRAVENOUS | Status: DC | PRN

## 2022-09-20 MED ORDER — LORATADINE 10 MG PO TABS
10.0000 mg | ORAL_TABLET | Freq: Once | ORAL | Status: AC
  Administered 2022-09-20: 10 mg via ORAL
  Filled 2022-09-20: qty 1

## 2022-09-20 MED ORDER — SODIUM CHLORIDE 0.9 % IV SOLN: Freq: Once | INTRAVENOUS | Status: AC

## 2022-09-20 MED ORDER — SODIUM CHLORIDE 0.9 % IV SOLN
400.0000 mg | Freq: Once | INTRAVENOUS | Status: AC
  Administered 2022-09-20: 400 mg via INTRAVENOUS
  Filled 2022-09-20: qty 400

## 2022-09-20 MED ORDER — ALTEPLASE 2 MG IJ SOLR: 2.0000 mg | Freq: Once | INTRAMUSCULAR | Status: DC | PRN

## 2022-09-20 MED ORDER — SODIUM CHLORIDE 0.9% FLUSH: 10.0000 mL | Freq: Once | INTRAVENOUS | Status: DC | PRN

## 2022-09-20 NOTE — Patient Instructions (Signed)

## 2022-09-23 ENCOUNTER — Telehealth: Payer: Self-pay | Admitting: Hematology

## 2022-09-23 NOTE — Telephone Encounter (Signed)
Rescheduled 06/14 appointment to 06/15 due to scheduling conflict. Patient is notified.

## 2022-09-27 ENCOUNTER — Ambulatory Visit: Payer: Medicare Other

## 2022-09-28 ENCOUNTER — Inpatient Hospital Stay: Payer: Medicare Other

## 2022-09-28 VITALS — BP 132/74 | HR 60 | Temp 97.6°F | Resp 16 | Ht 75.0 in

## 2022-09-28 DIAGNOSIS — D500 Iron deficiency anemia secondary to blood loss (chronic): Secondary | ICD-10-CM

## 2022-09-28 MED ORDER — SODIUM CHLORIDE 0.9 % IV SOLN
400.0000 mg | Freq: Once | INTRAVENOUS | Status: AC
  Administered 2022-09-28: 400 mg via INTRAVENOUS
  Filled 2022-09-28: qty 400

## 2022-09-28 MED ORDER — SODIUM CHLORIDE 0.9 % IV SOLN: Freq: Once | INTRAVENOUS | Status: AC

## 2022-09-28 MED ORDER — LORATADINE 10 MG PO TABS
10.0000 mg | ORAL_TABLET | Freq: Once | ORAL | Status: AC
  Administered 2022-09-28: 10 mg via ORAL
  Filled 2022-09-28: qty 1

## 2022-09-28 NOTE — Patient Instructions (Signed)

## 2022-09-28 NOTE — Progress Notes (Signed)
Patient has had a lot of iron with no problems- declined the 30 minute observation. VSS_ BP 132/74 (BP Location: Right Arm, Patient Position: Sitting)   Pulse 60   Temp 97.6 F (36.4 C) (Oral)   Resp 16   Ht 6\' 3"  (1.905 m)   SpO2 97%   BMI 23.17 kg/m   BP 132/74 (BP Location: Right Arm, Patient Position: Sitting)   Pulse 60   Temp 97.6 F (36.4 C) (Oral)   Resp 16   Ht 6\' 3"  (1.905 m)   SpO2 97%   BMI 23.17 kg/m   Ambulatory to the lobby.

## 2022-10-03 ENCOUNTER — Other Ambulatory Visit: Payer: Self-pay | Admitting: Physician Assistant

## 2022-10-03 ENCOUNTER — Inpatient Hospital Stay: Payer: Medicare Other | Admitting: Hematology

## 2022-10-03 ENCOUNTER — Inpatient Hospital Stay: Payer: Medicare Other

## 2022-10-04 ENCOUNTER — Inpatient Hospital Stay: Payer: Medicare Other

## 2022-10-04 ENCOUNTER — Other Ambulatory Visit: Payer: Self-pay

## 2022-10-04 VITALS — BP 123/70 | HR 68 | Temp 97.7°F | Resp 18

## 2022-10-04 DIAGNOSIS — D500 Iron deficiency anemia secondary to blood loss (chronic): Secondary | ICD-10-CM | POA: Diagnosis not present

## 2022-10-04 LAB — CBC WITH DIFFERENTIAL/PLATELET
Abs Immature Granulocytes: 0.02 10*3/uL (ref 0.00–0.07)
Basophils Absolute: 0 10*3/uL (ref 0.0–0.1)
Basophils Relative: 1 %
Eosinophils Absolute: 0.2 10*3/uL (ref 0.0–0.5)
Eosinophils Relative: 4 %
HCT: 35.1 % — ABNORMAL LOW (ref 39.0–52.0)
Hemoglobin: 11 g/dL — ABNORMAL LOW (ref 13.0–17.0)
Immature Granulocytes: 0 %
Lymphocytes Relative: 25 %
Lymphs Abs: 1.4 10*3/uL (ref 0.7–4.0)
MCH: 25.4 pg — ABNORMAL LOW (ref 26.0–34.0)
MCHC: 31.3 g/dL (ref 30.0–36.0)
MCV: 81.1 fL (ref 80.0–100.0)
Monocytes Absolute: 0.7 10*3/uL (ref 0.1–1.0)
Monocytes Relative: 12 %
Neutro Abs: 3.3 10*3/uL (ref 1.7–7.7)
Neutrophils Relative %: 58 %
Platelets: 207 10*3/uL (ref 150–400)
RBC: 4.33 MIL/uL (ref 4.22–5.81)
RDW: 21.5 % — ABNORMAL HIGH (ref 11.5–15.5)
WBC: 5.6 10*3/uL (ref 4.0–10.5)
nRBC: 0 % (ref 0.0–0.2)

## 2022-10-04 LAB — FERRITIN: Ferritin: 134 ng/mL (ref 24–336)

## 2022-10-04 MED ORDER — SODIUM CHLORIDE 0.9 % IV SOLN: Freq: Once | INTRAVENOUS | Status: AC

## 2022-10-04 MED ORDER — SODIUM CHLORIDE 0.9 % IV SOLN
400.0000 mg | Freq: Once | INTRAVENOUS | Status: AC
  Administered 2022-10-04: 400 mg via INTRAVENOUS
  Filled 2022-10-04: qty 400

## 2022-10-04 MED ORDER — ACETAMINOPHEN 325 MG PO TABS
650.0000 mg | ORAL_TABLET | Freq: Once | ORAL | Status: AC
  Administered 2022-10-04: 650 mg via ORAL
  Filled 2022-10-04: qty 2

## 2022-10-04 MED ORDER — CETIRIZINE HCL 10 MG PO TABS
10.0000 mg | ORAL_TABLET | Freq: Once | ORAL | Status: AC
  Administered 2022-10-04: 10 mg via ORAL
  Filled 2022-10-04: qty 1

## 2022-10-04 NOTE — Progress Notes (Signed)
Pt declined observation post-venofer infusion. VSS at d/c, pt ambulated to lobby.

## 2022-10-09 ENCOUNTER — Encounter: Payer: Self-pay | Admitting: Podiatry

## 2022-10-09 ENCOUNTER — Ambulatory Visit (INDEPENDENT_AMBULATORY_CARE_PROVIDER_SITE_OTHER): Payer: Medicare Other | Admitting: Podiatry

## 2022-10-09 DIAGNOSIS — E1165 Type 2 diabetes mellitus with hyperglycemia: Secondary | ICD-10-CM

## 2022-10-09 DIAGNOSIS — M79675 Pain in left toe(s): Secondary | ICD-10-CM

## 2022-10-09 DIAGNOSIS — B351 Tinea unguium: Secondary | ICD-10-CM | POA: Diagnosis not present

## 2022-10-09 DIAGNOSIS — M79674 Pain in right toe(s): Secondary | ICD-10-CM | POA: Diagnosis not present

## 2022-10-09 NOTE — Progress Notes (Signed)
This patient returns to my office for at risk foot care.  This patient requires this care by a professional since this patient will be at risk due to having  diabetes. This patient is unable to cut nails himself since the patient cannot reach his nails.These nails are painful walking and wearing shoes.  This patient presents for at risk foot care today.  General Appearance  Alert, conversant and in no acute stress.  Vascular  Dorsalis pedis and posterior tibial  pulses are palpable  bilaterally.  Capillary return is within normal limits  bilaterally. Temperature is within normal limits  bilaterally.  Neurologic  Senn-Weinstein monofilament wire test within normal limits  bilaterally. Muscle power within normal limits bilaterally.  Nails Thick disfigured discolored nails with subungual debris  from hallux to fifth toes bilaterally. No evidence of bacterial infection or drainage bilaterally.  Orthopedic  No limitations of motion  feet .  No crepitus or effusions noted.  No bony pathology or digital deformities noted.  Skin  normotropic skin with no porokeratosis noted bilaterally.  No signs of infections or ulcers noted.     Onychomycosis  Pain in right toes  Pain in left toes  Consent was obtained for treatment procedures.   Mechanical debridement of nails 1-5  bilaterally performed with a nail nipper.  Filed with dremel without incident.    Return office visit                     Told patient to return for periodic foot care and evaluation due to potential at risk complications.   Helane Gunther DPM

## 2022-10-10 ENCOUNTER — Other Ambulatory Visit: Payer: Self-pay

## 2022-11-01 ENCOUNTER — Inpatient Hospital Stay: Payer: Medicare Other | Attending: Internal Medicine

## 2022-11-01 ENCOUNTER — Other Ambulatory Visit: Payer: Self-pay

## 2022-11-01 ENCOUNTER — Inpatient Hospital Stay: Payer: Medicare Other | Admitting: Hematology

## 2022-11-01 VITALS — BP 102/68 | HR 65 | Temp 98.8°F | Resp 16 | Wt 184.6 lb

## 2022-11-01 DIAGNOSIS — Z7901 Long term (current) use of anticoagulants: Secondary | ICD-10-CM | POA: Diagnosis not present

## 2022-11-01 DIAGNOSIS — K31819 Angiodysplasia of stomach and duodenum without bleeding: Secondary | ICD-10-CM | POA: Diagnosis not present

## 2022-11-01 DIAGNOSIS — D500 Iron deficiency anemia secondary to blood loss (chronic): Secondary | ICD-10-CM

## 2022-11-01 LAB — CBC WITH DIFFERENTIAL/PLATELET
Abs Immature Granulocytes: 0.02 10*3/uL (ref 0.00–0.07)
Basophils Absolute: 0 10*3/uL (ref 0.0–0.1)
Basophils Relative: 1 %
Eosinophils Absolute: 0.3 10*3/uL (ref 0.0–0.5)
Eosinophils Relative: 6 %
HCT: 36 % — ABNORMAL LOW (ref 39.0–52.0)
Hemoglobin: 11.8 g/dL — ABNORMAL LOW (ref 13.0–17.0)
Immature Granulocytes: 0 %
Lymphocytes Relative: 28 %
Lymphs Abs: 1.5 10*3/uL (ref 0.7–4.0)
MCH: 27 pg (ref 26.0–34.0)
MCHC: 32.8 g/dL (ref 30.0–36.0)
MCV: 82.4 fL (ref 80.0–100.0)
Monocytes Absolute: 0.6 10*3/uL (ref 0.1–1.0)
Monocytes Relative: 12 %
Neutro Abs: 2.8 10*3/uL (ref 1.7–7.7)
Neutrophils Relative %: 53 %
Platelets: 184 10*3/uL (ref 150–400)
RBC: 4.37 MIL/uL (ref 4.22–5.81)
RDW: 21.2 % — ABNORMAL HIGH (ref 11.5–15.5)
WBC: 5.2 10*3/uL (ref 4.0–10.5)
nRBC: 0 % (ref 0.0–0.2)

## 2022-11-01 LAB — FERRITIN: Ferritin: 76 ng/mL (ref 24–336)

## 2022-11-01 NOTE — Assessment & Plan Note (Signed)
-  he has h/o anemia since at least 02/2019, when he was hospitalized and received IV Feraheme. His Hg has been in 6-9 g/dl range in the past year and again required blood transfusion in 07/2021 -most recent colonoscopy 02/2021, EGD 08/05/21, and capsule endoscopy 08/13/21. 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 -he received iv Venofer 400mg  as needed and responded well  -Will monitor his lab monthly -he has required 5 doses of venofer 400mg  in 2024, still mildly anemic, will schedule monthly venofer for him

## 2022-11-01 NOTE — Progress Notes (Signed)
[facility]   [phone] [phone]   Clinic Follow up Note   Patient Care Team: Raymon Mutton., FNP as PCP - General (Family Medicine) Kathi Der, MD as Consulting Physician (Gastroenterology) Malachy Mood, MD as Consulting Physician (Hematology) Tomma Lightning, MD as Consulting Physician (Pulmonary Disease) Cephus Shelling, MD as Consulting Physician (Vascular Surgery)  Date of Service:  11/01/2022  CHIEF COMPLAINT: f/u of anemia   CURRENT THERAPY:  Iron Sucrose (Venofer) 400 mg  ASSESSMENT:  Elijah Sandoval is a 72 y.o. male with   Iron deficiency anemia due to chronic blood loss -he has h/o anemia since at least 02/2019, when he was hospitalized and received IV Feraheme. His Hg has been in 6-9 g/dl range in the past year and again required blood transfusion in 07/2021 -most recent colonoscopy 02/2021, EGD 08/05/21, and capsule endoscopy 08/13/21. 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 -he received iv Venofer 400mg  as needed and responded well  -Will monitor his lab monthly -he has required 5 doses of venofer 400mg  in 2024, still mildly anemic, we discussed the option of scheduling monthly venofer for him vs lab first and call him for iv iron if needed, he opted for lab and iron PRN     PLAN: -Lab reviewed hg 11.8 - check lab q2 months, f/u in 6 months with NP    INTERVAL HISTORY:  Elijah Sandoval is here for a follow up of anemia . He was last seen by [name] on 09/13/2022. He presents to the clinic alone. Pt state that he feel better since receiving IV Iron.      All other systems were reviewed with the patient and are negative.  MEDICAL HISTORY:  Past Medical History:  Diagnosis Date   COPD (chronic obstructive pulmonary disease) (HCC)    Diabetes mellitus    Stroke (HCC)    Pt reports TIA in 2013   Transfusion history     SURGICAL HISTORY: Past Surgical History:  Procedure  Laterality Date   ESOPHAGOGASTRODUODENOSCOPY (EGD) WITH PROPOFOL N/A 08/05/2021   Procedure: ESOPHAGOGASTRODUODENOSCOPY (EGD) WITH PROPOFOL;  Surgeon: Kerin Salen, MD;  Location: [facility];  Service: Gastroenterology;  Laterality: N/A;   GIVENS CAPSULE STUDY N/A 08/05/2021   Procedure: GIVENS CAPSULE STUDY;  Surgeon: Kerin Salen, MD;  Location: [facility];  Service: Gastroenterology;  Laterality: N/A;   HOT HEMOSTASIS N/A 08/05/2021   Procedure: HOT HEMOSTASIS (ARGON PLASMA COAGULATION/BICAP);  Surgeon: Kerin Salen, MD;  Location: [facility];  Service: Gastroenterology;  Laterality: N/A;   NO PAST SURGERIES      I have reviewed the social history and family history with the patient and they are unchanged from previous note.  ALLERGIES:  is allergic to latex.  MEDICATIONS:  Current Outpatient Medications  Medication Sig Dispense Refill   amLODipine (NORVASC) 5 MG tablet Take 5 mg by mouth daily.     atorvastatin (LIPITOR) 40 MG tablet Take 1 tablet (40 mg total) by mouth daily at 6 PM. (Patient taking differently: Take 40 mg by mouth daily.) 30 tablet 0   clopidogrel (PLAVIX) 75 MG tablet Take 1 tablet by mouth daily.     clotrimazole-betamethasone (LOTRISONE) cream Apply 1 Application topically 2 (two) times daily. 30 g 2   ELIQUIS 5 MG TABS tablet TAKE 1 TABLET BY MOUTH TWICE A DAY 60 tablet 3   famotidine (PEPCID) 20 MG tablet Take 1 tablet (20 mg total) by mouth 2 (two) times daily as  needed for heartburn or indigestion (itching).     ferrous sulfate 325 (65 FE) MG tablet Take 325 mg by mouth 2 (two) times daily with a meal.     glipiZIDE (GLUCOTROL) 10 MG tablet Take 10 mg by mouth 2 (two) times daily.     hydrOXYzine (ATARAX) 25 MG tablet Take 25 mg by mouth 3 (three) times daily as needed for itching or anxiety.     latanoprost (XALATAN) 0.005 % ophthalmic solution Place 1 drop into both eyes at bedtime.     lisinopril (ZESTRIL) 40 MG tablet Take 40 mg by mouth daily.      metFORMIN (GLUCOPHAGE) 500 MG tablet Take 1 tablet by mouth 2 (two) times daily.     methocarbamol (ROBAXIN) 500 MG tablet TAKE 1 2 TABLETS (1,000 MG) BY ORAL ROUTE 4 TIMES PER DAY AS NEEDED FOR MUSCLE SPASM FOR 30 DAYS     metoprolol succinate (TOPROL-XL) 100 MG 24 hr tablet Take 100 mg by mouth daily.     metroNIDAZOLE (FLAGYL) 500 MG tablet TAKE 1 TABLET (500 MG) BY ORAL ROUTE 3 TIMES PER DAY FOR 14 DAYS FOR H PYLORI INFECTION     pantoprazole (PROTONIX) 40 MG tablet Take 40 mg by mouth 2 (two) times daily.     sucralfate (CARAFATE) 1 g tablet TAKE 1 TABLET (1 GRAM) BY ORAL ROUTE 2 TIMES PER DAY ON AN EMPTY STOMACH FOR ULCER FOR 90 DAYS     Tiotropium Bromide-Olodaterol (STIOLTO RESPIMAT) 2.5-2.5 MCG/ACT AERS Inhale 2 puffs into the lungs daily. 4 g 0   Tiotropium Bromide-Olodaterol (STIOLTO RESPIMAT) 2.5-2.5 MCG/ACT AERS Inhale 2 puffs into the lungs daily. 4 g 6   traMADol (ULTRAM) 50 MG tablet TAKE 1 TABLET (50 MG) BY ORAL ROUTE EVERY 6 HOURS AS NEEDED FOR LOW BACK PAIN FOR 5 DAYS     triamcinolone cream (KENALOG) 0.1 % Apply 1 application. topically daily as needed (itching).     No current facility-administered medications for this visit.    PHYSICAL EXAMINATION: ECOG PERFORMANCE STATUS: 1 - Symptomatic but completely ambulatory  Vitals:   11/01/22 1043  BP: 102/68  Pulse: 65  Resp: 16  Temp: 98.8 F (37.1 C)  SpO2: 98%   Wt Readings from Last 3 Encounters:  11/01/22 184 lb 9.6 oz (83.7 kg)  09/13/22 185 lb 6.4 oz (84.1 kg)  08/16/22 185 lb 3 oz (84 kg)     GENERAL:alert, no distress and comfortable SKIN: skin color normal, no rashes or significant lesions EYES: normal, Conjunctiva are pink and non-injected, sclera clear  NEURO: alert & oriented x 3 with fluent speech   LABORATORY DATA:  I have reviewed the data as listed    Latest Ref Rng & Units 11/01/2022   10:20 AM 10/04/2022   10:21 AM 09/13/2022    8:57 AM  CBC  WBC 4.0 - 10.5 K/uL 5.2  5.6  6.1   Hemoglobin  13.0 - 17.0 g/dL 16.1  09.6  04.5   Hematocrit 39.0 - 52.0 % 36.0  35.1  32.8   Platelets 150 - 400 K/uL 184  207  193         Latest Ref Rng & Units 08/16/2022    6:39 PM 08/06/2021    3:23 AM 08/05/2021    8:24 AM  CMP  Glucose 70 - 99 mg/dL 409  811  914   BUN 8 - 23 mg/dL 28  10  9    Creatinine 0.61 - 1.24 mg/dL 7.82  1.18  0.97   Sodium 135 - 145 mmol/L 133  137  139   Potassium 3.5 - 5.1 mmol/L 3.9  3.9  3.7   Chloride 98 - 111 mmol/L 103  109  112   CO2 22 - 32 mmol/L 20  22  22    Calcium 8.9 - 10.3 mg/dL 9.2  8.6  8.8   Total Protein 6.5 - 8.1 g/dL 7.5     Total Bilirubin 0.3 - 1.2 mg/dL 0.7     Alkaline Phos 38 - 126 U/L 65     AST 15 - 41 U/L 17     ALT 0 - 44 U/L 13         RADIOGRAPHIC STUDIES: I have personally reviewed the radiological images as listed and agreed with the findings in the report. No results found.    No orders of the defined types were placed in this encounter.  All questions were answered. The patient knows to call the clinic with any problems, questions or concerns. No barriers to learning was detected. The total time spent in the appointment was 15 minutes.     Malachy Mood, MD 11/01/2022   Carolin Coy, CMA, am acting as scribe for Malachy Mood, MD.   I have reviewed the above documentation for accuracy and completeness, and I agree with the above.

## 2022-11-04 ENCOUNTER — Telehealth: Payer: Self-pay | Admitting: Hematology

## 2022-11-04 ENCOUNTER — Other Ambulatory Visit: Payer: Self-pay | Admitting: Nurse Practitioner

## 2022-11-04 DIAGNOSIS — D500 Iron deficiency anemia secondary to blood loss (chronic): Secondary | ICD-10-CM

## 2022-11-06 ENCOUNTER — Other Ambulatory Visit: Payer: Self-pay

## 2022-11-13 ENCOUNTER — Encounter: Payer: Self-pay | Admitting: Physician Assistant

## 2022-11-13 NOTE — Progress Notes (Signed)
This encounter was created in error - please disregard.

## 2023-01-03 ENCOUNTER — Inpatient Hospital Stay: Payer: Medicare Other | Attending: Internal Medicine

## 2023-01-03 DIAGNOSIS — D500 Iron deficiency anemia secondary to blood loss (chronic): Secondary | ICD-10-CM | POA: Diagnosis present

## 2023-01-03 LAB — CBC WITH DIFFERENTIAL (CANCER CENTER ONLY)
Abs Immature Granulocytes: 0.03 10*3/uL (ref 0.00–0.07)
Basophils Absolute: 0 10*3/uL (ref 0.0–0.1)
Basophils Relative: 1 %
Eosinophils Absolute: 0.3 10*3/uL (ref 0.0–0.5)
Eosinophils Relative: 4 %
HCT: 36.2 % — ABNORMAL LOW (ref 39.0–52.0)
Hemoglobin: 12.1 g/dL — ABNORMAL LOW (ref 13.0–17.0)
Immature Granulocytes: 1 %
Lymphocytes Relative: 27 %
Lymphs Abs: 1.7 10*3/uL (ref 0.7–4.0)
MCH: 28.3 pg (ref 26.0–34.0)
MCHC: 33.4 g/dL (ref 30.0–36.0)
MCV: 84.6 fL (ref 80.0–100.0)
Monocytes Absolute: 0.7 10*3/uL (ref 0.1–1.0)
Monocytes Relative: 11 %
Neutro Abs: 3.5 10*3/uL (ref 1.7–7.7)
Neutrophils Relative %: 56 %
Platelet Count: 174 10*3/uL (ref 150–400)
RBC: 4.28 MIL/uL (ref 4.22–5.81)
RDW: 16.9 % — ABNORMAL HIGH (ref 11.5–15.5)
WBC Count: 6.2 10*3/uL (ref 4.0–10.5)
nRBC: 0 % (ref 0.0–0.2)

## 2023-01-03 LAB — FERRITIN: Ferritin: 14 ng/mL — ABNORMAL LOW (ref 24–336)

## 2023-01-07 ENCOUNTER — Other Ambulatory Visit: Payer: Self-pay

## 2023-01-08 ENCOUNTER — Telehealth: Payer: Self-pay

## 2023-01-08 ENCOUNTER — Other Ambulatory Visit: Payer: Self-pay

## 2023-01-08 ENCOUNTER — Other Ambulatory Visit: Payer: Self-pay | Admitting: Nurse Practitioner

## 2023-01-08 ENCOUNTER — Other Ambulatory Visit: Payer: Self-pay | Admitting: Physician Assistant

## 2023-01-08 NOTE — Telephone Encounter (Addendum)
Auth Submission: NO AUTH NEEDED Site of care: Site of care: [facility] Payer: [employer] Medicare Medication & CPT/J Code(s) submitted: Venofer (Iron Sucrose) J1756 Route of submission (phone, fax, portal): portal Phone # Fax # Auth type: Buy/Bill PB Units/visits requested: 400mg  x 2 doses Reference number:  Approval from: 01/08/23 to 04/15/23   I confirmed on the [facility] portal that venofer does not need a prior authorization for this patient.

## 2023-01-09 ENCOUNTER — Other Ambulatory Visit: Payer: Self-pay

## 2023-01-10 ENCOUNTER — Other Ambulatory Visit: Payer: Self-pay

## 2023-01-22 ENCOUNTER — Telehealth: Payer: Self-pay

## 2023-01-22 ENCOUNTER — Other Ambulatory Visit: Payer: Self-pay

## 2023-01-22 NOTE — Telephone Encounter (Signed)
Returned pt's call from 01/21/2023 about hbg and blood.  Spoke with pt regarding his lab results on 01/03/2023.  Pt stated he doesn't understand why he needs IV Iron infusions because his Hbg was 12.1 on his last lab draw.  Explained to pt that his ferritin which was also drawn on 01/03/2023 was 14 which is below normal range; therefore, Elijah Gros, NP recommends that the pt get IV Iron.  Pt verbalized understanding and had no further questions or concerns at this time.  Pt stated he will return the call he received from [facility] to schedule his IV iron infusions.

## 2023-02-05 ENCOUNTER — Ambulatory Visit: Payer: Medicare Other

## 2023-02-10 ENCOUNTER — Ambulatory Visit: Payer: Medicare Other | Admitting: Podiatry

## 2023-02-12 ENCOUNTER — Ambulatory Visit (INDEPENDENT_AMBULATORY_CARE_PROVIDER_SITE_OTHER): Payer: Medicare Other

## 2023-02-12 VITALS — BP 125/68 | HR 60 | Temp 97.5°F | Resp 18 | Ht 75.0 in | Wt 183.0 lb

## 2023-02-12 DIAGNOSIS — D649 Anemia, unspecified: Secondary | ICD-10-CM | POA: Diagnosis not present

## 2023-02-12 MED ORDER — ACETAMINOPHEN 325 MG PO TABS: 650.0000 mg | ORAL_TABLET | Freq: Once | ORAL | Status: DC

## 2023-02-12 MED ORDER — DIPHENHYDRAMINE HCL 25 MG PO CAPS: 25.0000 mg | ORAL_CAPSULE | Freq: Once | ORAL | Status: DC

## 2023-02-12 MED ORDER — IRON SUCROSE 400 MG IVPB - SIMPLE MED
400.0000 mg | Freq: Once | Status: AC
  Administered 2023-02-12: 400 mg via INTRAVENOUS
  Filled 2023-02-12: qty 270

## 2023-02-12 NOTE — Progress Notes (Signed)
Diagnosis: Iron Deficiency Anemia  Provider:  Chilton Greathouse MD  Procedure: IV Infusion  IV Type: Peripheral, IV Location: R Antecubital  Venofer (Iron Sucrose), Dose: 400 mg  Infusion Start Time: 0952  Infusion Stop Time: 1236  Post Infusion IV Care: Patient declined observation and Peripheral IV Discontinued  Discharge: Condition: Good, Destination: Home . AVS Provided  Performed by:  Rico Ala, LPN

## 2023-02-21 ENCOUNTER — Ambulatory Visit: Payer: Medicare Other

## 2023-02-21 VITALS — BP 119/65 | HR 59 | Temp 97.8°F | Resp 16 | Ht 75.0 in | Wt 182.1 lb

## 2023-02-21 DIAGNOSIS — D649 Anemia, unspecified: Secondary | ICD-10-CM | POA: Diagnosis not present

## 2023-02-21 MED ORDER — DIPHENHYDRAMINE HCL 25 MG PO CAPS: 25.0000 mg | ORAL_CAPSULE | Freq: Once | ORAL | Status: DC

## 2023-02-21 MED ORDER — ACETAMINOPHEN 325 MG PO TABS: 650.0000 mg | ORAL_TABLET | Freq: Once | ORAL | Status: DC

## 2023-02-21 MED ORDER — IRON SUCROSE 400 MG IVPB - SIMPLE MED
400.0000 mg | Freq: Once | Status: AC
  Administered 2023-02-21: 400 mg via INTRAVENOUS
  Filled 2023-02-21: qty 270

## 2023-02-21 NOTE — Progress Notes (Signed)
Diagnosis: Iron Deficiency Anemia  Provider:  Chilton Greathouse MD  Procedure: IV Infusion  IV Type: Peripheral, IV Location: R Forearm  Venofer (Iron Sucrose), Dose: 400 mg  Infusion Start Time: 1212  Infusion Stop Time: 1449  Post Infusion IV Care: Patient declined observation and Peripheral IV Discontinued  Discharge: Condition: Good, Destination: Home . AVS Declined  Performed by:  Adriana Mccallum, RN

## 2023-02-28 ENCOUNTER — Inpatient Hospital Stay: Payer: Medicare Other | Attending: Internal Medicine

## 2023-02-28 DIAGNOSIS — D500 Iron deficiency anemia secondary to blood loss (chronic): Secondary | ICD-10-CM | POA: Insufficient documentation

## 2023-02-28 LAB — CBC WITH DIFFERENTIAL (CANCER CENTER ONLY)
Abs Immature Granulocytes: 0.03 10*3/uL (ref 0.00–0.07)
Basophils Absolute: 0 10*3/uL (ref 0.0–0.1)
Basophils Relative: 1 %
Eosinophils Absolute: 0.2 10*3/uL (ref 0.0–0.5)
Eosinophils Relative: 4 %
HCT: 41.7 % (ref 39.0–52.0)
Hemoglobin: 13.8 g/dL (ref 13.0–17.0)
Immature Granulocytes: 1 %
Lymphocytes Relative: 30 %
Lymphs Abs: 1.9 10*3/uL (ref 0.7–4.0)
MCH: 28.4 pg (ref 26.0–34.0)
MCHC: 33.1 g/dL (ref 30.0–36.0)
MCV: 85.8 fL (ref 80.0–100.0)
Monocytes Absolute: 0.6 10*3/uL (ref 0.1–1.0)
Monocytes Relative: 9 %
Neutro Abs: 3.5 10*3/uL (ref 1.7–7.7)
Neutrophils Relative %: 55 %
Platelet Count: 184 10*3/uL (ref 150–400)
RBC: 4.86 MIL/uL (ref 4.22–5.81)
RDW: 16.3 % — ABNORMAL HIGH (ref 11.5–15.5)
WBC Count: 6.3 10*3/uL (ref 4.0–10.5)
nRBC: 0 % (ref 0.0–0.2)

## 2023-02-28 LAB — FERRITIN: Ferritin: 254 ng/mL (ref 24–336)

## 2023-03-05 ENCOUNTER — Other Ambulatory Visit: Payer: Self-pay

## 2023-04-17 ENCOUNTER — Ambulatory Visit (INDEPENDENT_AMBULATORY_CARE_PROVIDER_SITE_OTHER): Payer: Medicare Other | Admitting: Podiatry

## 2023-04-17 ENCOUNTER — Encounter: Payer: Self-pay | Admitting: Podiatry

## 2023-04-17 VITALS — Ht 75.0 in | Wt 182.1 lb

## 2023-04-17 DIAGNOSIS — B351 Tinea unguium: Secondary | ICD-10-CM | POA: Diagnosis not present

## 2023-04-17 DIAGNOSIS — E1165 Type 2 diabetes mellitus with hyperglycemia: Secondary | ICD-10-CM

## 2023-04-17 DIAGNOSIS — M79674 Pain in right toe(s): Secondary | ICD-10-CM

## 2023-04-17 DIAGNOSIS — M79675 Pain in left toe(s): Secondary | ICD-10-CM

## 2023-04-17 NOTE — Progress Notes (Signed)
 This patient returns to my office for at risk foot care.  This patient requires this care by a professional since this patient will be at risk due to having  diabetes.  This patient is unable to cut nails himself since the patient cannot reach his nails.These nails are painful walking and wearing shoes.  This patient presents for at risk foot care today.  General Appearance  Alert, conversant and in no acute stress.  Vascular  Dorsalis pedis and posterior tibial  pulses are palpable  bilaterally.  Capillary return is within normal limits  bilaterally. Temperature is within normal limits  bilaterally.  Neurologic  Senn-Weinstein monofilament wire test within normal limits  bilaterally. Muscle power within normal limits bilaterally.  Nails Thick disfigured discolored nails with subungual debris  from hallux to fifth toes bilaterally. No evidence of bacterial infection or drainage bilaterally.  Orthopedic  No limitations of motion  feet .  No crepitus or effusions noted.  No bony pathology or digital deformities noted.  Skin  normotropic skin with no porokeratosis noted bilaterally.  No signs of infections or ulcers noted.     Onychomycosis  Pain in right toes  Pain in left toes  Consent was obtained for treatment procedures.   Mechanical debridement of nails 1-5  bilaterally performed with a nail nipper.  Filed with dremel without incident.    Return office visit    4  months                  Told patient to return for periodic foot care and evaluation due to potential at risk complications.   Helane Gunther DPM

## 2023-05-07 NOTE — Assessment & Plan Note (Signed)
-  he has h/o anemia since at least 02/2019, when he was hospitalized and received IV Feraheme. His Hg has been in 6-9 g/dl range in the past year and again required blood transfusion in 07/2021 -most recent colonoscopy 02/2021, EGD 08/05/21, and capsule endoscopy 08/13/21. 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 -he received iv Venofer 400mg  as needed and responded well  -Will monitor his lab monthly -he has required 5 doses of venofer 400mg  in 2024, still mildly anemic, will schedule monthly venofer for him

## 2023-05-07 NOTE — Progress Notes (Unsigned)
Patient Care Team: Raymon Mutton., FNP as PCP - General (Family Medicine) Kathi Der, MD as Consulting Physician (Gastroenterology) Malachy Mood, MD as Consulting Physician (Hematology) Tomma Lightning, MD as Consulting Physician (Pulmonary Disease) Cephus Shelling, MD as Consulting Physician (Vascular Surgery)  Clinic Day: 05/09/2023  Referring physician: Raymon Mutton., FNP  ASSESSMENT & PLAN:   Assessment & Plan: Iron deficiency anemia due to chronic blood loss -Elijah Sandoval has h/o anemia since at least 02/2019, when Elijah Sandoval was hospitalized and received IV Feraheme. His Hg has been in 6-9 g/dl range in the past year and again required blood transfusion in 07/2021 -most recent colonoscopy 02/2021, EGD 08/05/21, and capsule endoscopy 08/13/21. 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 -Elijah Sandoval received iv Venofer 400mg  as needed and responded well  -Will monitor his lab monthly -Elijah Sandoval has required 5 doses of venofer 400mg  in 2024, still mildly anemic, will schedule monthly venofer for him    Plan: Labs reviewed  -CBC showing WBC 6.6; Hgb 14.9; Hct 44.0; MCV 87.6; plt 167 -Ferritin 73 No requirement for iron infusion or blood products during today's visit. Continue to check labs monthly and administer IV iron and/or blood products as indicated. Labs with follow-up in 6 months.  The patient understands the plans discussed today and is in agreement with them.  Elijah Sandoval knows to contact our office if Elijah Sandoval develops concerns prior to his next appointment.  I provided 20 minutes of face-to-face time during this encounter and > 50% was spent counseling as documented under my assessment and plan.    Carlean Jews, NP  [facility] - [facility]PT OF [facility] [address] [place] [address] Dept: [phone] Dept Fax: [phone]   Orders Placed This Encounter  Procedures   CBC with Differential  (Cancer Center Only)    Standing Status:   Standing    Number of Occurrences:   12    Expiration Date:   05/08/2024   Ferritin    Standing Status:   Standing    Number of Occurrences:   12    Expiration Date:   05/08/2024      CHIEF COMPLAINT:  CC: iron deficiency anemia due to chronic blood loss.  Current Treatment:  IV iron as needed.   INTERVAL HISTORY:  Elijah Sandoval is here today for repeat clinical assessment. Elijah Sandoval was last seen by Dr. Mosetta Putt on 11/01/2022. Mildly anemic, but not requiring IV iron infusion.  Elijah Sandoval reports feeling well today.  Elijah Sandoval denies chest pain, chest pressure, or shortness of breath. Elijah Sandoval denies headaches or visual disturbances. Elijah Sandoval denies abdominal pain, nausea, vomiting, or changes in bowel or bladder habits.  Elijah Sandoval denies fevers or chills. Elijah Sandoval denies pain. His appetite is good. His weight has increased 3 pounds over last 4 months .  I have reviewed the past medical history, past surgical history, social history and family history with the patient and they are unchanged from previous note.  ALLERGIES:  is allergic to latex.  MEDICATIONS:  Current Outpatient Medications  Medication Sig Dispense Refill   amLODipine (NORVASC) 5 MG tablet Take 5 mg by mouth daily.     atorvastatin (LIPITOR) 40 MG tablet Take 1 tablet (40 mg total) by mouth daily at 6 PM. (Patient taking differently: Take 40 mg by mouth daily.) 30 tablet 0   clopidogrel (PLAVIX) 75 MG tablet Take 1 tablet by mouth daily.  clotrimazole-betamethasone (LOTRISONE) cream Apply 1 Application topically 2 (two) times daily. 30 g 2   ELIQUIS 5 MG TABS tablet TAKE 1 TABLET BY MOUTH TWICE A DAY 60 tablet 3   famotidine (PEPCID) 20 MG tablet Take 1 tablet (20 mg total) by mouth 2 (two) times daily as needed for heartburn or indigestion (itching).     ferrous sulfate 325 (65 FE) MG tablet Take 325 mg by mouth 2 (two) times daily with a meal.     glipiZIDE (GLUCOTROL) 10 MG tablet Take 10 mg by mouth 2 (two) times daily.      hydrOXYzine (ATARAX) 25 MG tablet Take 25 mg by mouth 3 (three) times daily as needed for itching or anxiety.     latanoprost (XALATAN) 0.005 % ophthalmic solution Place 1 drop into both eyes at bedtime.     lisinopril (ZESTRIL) 40 MG tablet Take 40 mg by mouth daily.     metFORMIN (GLUCOPHAGE) 500 MG tablet Take 1 tablet by mouth 2 (two) times daily.     methocarbamol (ROBAXIN) 500 MG tablet TAKE 1 2 TABLETS (1,000 MG) BY ORAL ROUTE 4 TIMES PER DAY AS NEEDED FOR MUSCLE SPASM FOR 30 DAYS     metoprolol succinate (TOPROL-XL) 100 MG 24 hr tablet Take 100 mg by mouth daily.     metroNIDAZOLE (FLAGYL) 500 MG tablet TAKE 1 TABLET (500 MG) BY ORAL ROUTE 3 TIMES PER DAY FOR 14 DAYS FOR H PYLORI INFECTION     pantoprazole (PROTONIX) 40 MG tablet Take 40 mg by mouth 2 (two) times daily.     sucralfate (CARAFATE) 1 g tablet TAKE 1 TABLET (1 GRAM) BY ORAL ROUTE 2 TIMES PER DAY ON AN EMPTY STOMACH FOR ULCER FOR 90 DAYS     Tiotropium Bromide-Olodaterol (STIOLTO RESPIMAT) 2.5-2.5 MCG/ACT AERS Inhale 2 puffs into the lungs daily. 4 g 0   Tiotropium Bromide-Olodaterol (STIOLTO RESPIMAT) 2.5-2.5 MCG/ACT AERS Inhale 2 puffs into the lungs daily. 4 g 6   traMADol (ULTRAM) 50 MG tablet TAKE 1 TABLET (50 MG) BY ORAL ROUTE EVERY 6 HOURS AS NEEDED FOR LOW BACK PAIN FOR 5 DAYS     triamcinolone cream (KENALOG) 0.1 % Apply 1 application. topically daily as needed (itching).     No current facility-administered medications for this visit.     REVIEW OF SYSTEMS:   Constitutional: Denies fevers, chills or abnormal weight loss Eyes: Denies blurriness of vision Ears, nose, mouth, throat, and face: Denies mucositis or sore throat Respiratory: Denies cough, dyspnea or wheezes Cardiovascular: Denies palpitation, chest discomfort or lower extremity swelling Gastrointestinal:  Denies nausea, heartburn or change in bowel habits Skin: Denies abnormal skin rashes Lymphatics: Denies new lymphadenopathy or easy  bruising Neurological:Denies numbness, tingling or new weaknesses Behavioral/Psych: Mood is stable, no new changes  All other systems were reviewed with the patient and are negative.   VITALS:   Today's Vitals   05/09/23 0928 05/09/23 0929  BP: 127/65   Pulse: 69   Resp: 16   Temp: 97.6 F (36.4 C)   TempSrc: Temporal   SpO2: 98%   Weight: 185 lb 11.2 oz (84.2 kg)   Height: 6\' 3"  (1.905 m)   PainSc:  0-No pain   Body mass index is 23.21 kg/m.   Wt Readings from Last 3 Encounters:  05/09/23 185 lb 11.2 oz (84.2 kg)  04/17/23 182 lb 1.6 oz (82.6 kg)  02/21/23 182 lb 1.6 oz (82.6 kg)    Body mass index is 23.21 kg/m.  Performance status (ECOG): 0 - Asymptomatic  PHYSICAL EXAM:   GENERAL:alert, no distress and comfortable SKIN: skin color, texture, turgor are normal, no rashes or significant lesions EYES: normal, Conjunctiva are pink and non-injected, sclera clear OROPHARYNX:no exudate, no erythema and lips, buccal mucosa, and tongue normal  NECK: supple, thyroid normal size, non-tender, without nodularity LYMPH:  no palpable lymphadenopathy in the cervical, axillary or inguinal LUNGS: clear to auscultation and percussion with normal breathing effort HEART: regular rate & rhythm and no murmurs and no lower extremity edema ABDOMEN:abdomen soft, non-tender and normal bowel sounds Musculoskeletal:no cyanosis of digits and no clubbing  NEURO: alert & oriented x 3 with fluent speech, no focal motor/sensory deficits  LABORATORY DATA:  I have reviewed the data as listed    Component Value Date/Time   NA 133 (L) 08/16/2022 1839   K 3.9 08/16/2022 1839   CL 103 08/16/2022 1839   CO2 20 (L) 08/16/2022 1839   GLUCOSE 117 (H) 08/16/2022 1839   BUN 28 (H) 08/16/2022 1839   CREATININE 2.12 (H) 08/16/2022 1839   CALCIUM 9.2 08/16/2022 1839   PROT 7.5 08/16/2022 1839   ALBUMIN 4.3 08/16/2022 1839   AST 17 08/16/2022 1839   ALT 13 08/16/2022 1839   ALKPHOS 65 08/16/2022  1839   BILITOT 0.7 08/16/2022 1839   GFRNONAA 33 (L) 08/16/2022 1839   GFRAA >60 02/25/2019 1059     Lab Results  Component Value Date   WBC 6.6 05/09/2023   NEUTROABS 3.7 05/09/2023   HGB 14.9 05/09/2023   HCT 44.0 05/09/2023   MCV 87.6 05/09/2023   PLT 167 05/09/2023

## 2023-05-08 ENCOUNTER — Other Ambulatory Visit: Payer: Self-pay | Admitting: Nurse Practitioner

## 2023-05-08 DIAGNOSIS — D500 Iron deficiency anemia secondary to blood loss (chronic): Secondary | ICD-10-CM

## 2023-05-09 ENCOUNTER — Inpatient Hospital Stay: Payer: Medicare Other | Attending: Internal Medicine

## 2023-05-09 ENCOUNTER — Inpatient Hospital Stay: Payer: Medicare Other | Admitting: Nurse Practitioner

## 2023-05-09 VITALS — BP 127/65 | HR 69 | Temp 97.6°F | Resp 16 | Ht 75.0 in | Wt 185.7 lb

## 2023-05-09 DIAGNOSIS — D500 Iron deficiency anemia secondary to blood loss (chronic): Secondary | ICD-10-CM | POA: Diagnosis not present

## 2023-05-09 LAB — CBC WITH DIFFERENTIAL (CANCER CENTER ONLY)
Abs Immature Granulocytes: 0.03 10*3/uL (ref 0.00–0.07)
Basophils Absolute: 0 10*3/uL (ref 0.0–0.1)
Basophils Relative: 1 %
Eosinophils Absolute: 0.3 10*3/uL (ref 0.0–0.5)
Eosinophils Relative: 4 %
HCT: 44 % (ref 39.0–52.0)
Hemoglobin: 14.9 g/dL (ref 13.0–17.0)
Immature Granulocytes: 1 %
Lymphocytes Relative: 28 %
Lymphs Abs: 1.9 10*3/uL (ref 0.7–4.0)
MCH: 29.7 pg (ref 26.0–34.0)
MCHC: 33.9 g/dL (ref 30.0–36.0)
MCV: 87.6 fL (ref 80.0–100.0)
Monocytes Absolute: 0.7 10*3/uL (ref 0.1–1.0)
Monocytes Relative: 11 %
Neutro Abs: 3.7 10*3/uL (ref 1.7–7.7)
Neutrophils Relative %: 55 %
Platelet Count: 167 10*3/uL (ref 150–400)
RBC: 5.02 MIL/uL (ref 4.22–5.81)
RDW: 15 % (ref 11.5–15.5)
WBC Count: 6.6 10*3/uL (ref 4.0–10.5)
nRBC: 0 % (ref 0.0–0.2)

## 2023-05-09 LAB — FERRITIN: Ferritin: 73 ng/mL (ref 24–336)

## 2023-05-11 ENCOUNTER — Encounter: Payer: Self-pay | Admitting: Nurse Practitioner

## 2023-05-11 ENCOUNTER — Encounter: Payer: Self-pay | Admitting: Physician Assistant

## 2023-05-15 ENCOUNTER — Other Ambulatory Visit: Payer: Self-pay

## 2023-06-03 ENCOUNTER — Inpatient Hospital Stay: Payer: Medicare Other | Attending: Internal Medicine

## 2023-06-03 DIAGNOSIS — D500 Iron deficiency anemia secondary to blood loss (chronic): Secondary | ICD-10-CM | POA: Insufficient documentation

## 2023-06-03 LAB — CBC WITH DIFFERENTIAL (CANCER CENTER ONLY)
Abs Immature Granulocytes: 0.05 10*3/uL (ref 0.00–0.07)
Basophils Absolute: 0 10*3/uL (ref 0.0–0.1)
Basophils Relative: 1 %
Eosinophils Absolute: 0.2 10*3/uL (ref 0.0–0.5)
Eosinophils Relative: 3 %
HCT: 44.7 % (ref 39.0–52.0)
Hemoglobin: 15.1 g/dL (ref 13.0–17.0)
Immature Granulocytes: 1 %
Lymphocytes Relative: 28 %
Lymphs Abs: 1.8 10*3/uL (ref 0.7–4.0)
MCH: 29.5 pg (ref 26.0–34.0)
MCHC: 33.8 g/dL (ref 30.0–36.0)
MCV: 87.5 fL (ref 80.0–100.0)
Monocytes Absolute: 0.6 10*3/uL (ref 0.1–1.0)
Monocytes Relative: 9 %
Neutro Abs: 3.8 10*3/uL (ref 1.7–7.7)
Neutrophils Relative %: 58 %
Platelet Count: 169 10*3/uL (ref 150–400)
RBC: 5.11 MIL/uL (ref 4.22–5.81)
RDW: 13.8 % (ref 11.5–15.5)
WBC Count: 6.5 10*3/uL (ref 4.0–10.5)
nRBC: 0 % (ref 0.0–0.2)

## 2023-06-03 LAB — FERRITIN: Ferritin: 76 ng/mL (ref 24–336)

## 2023-06-06 ENCOUNTER — Other Ambulatory Visit: Payer: Self-pay

## 2023-07-01 ENCOUNTER — Inpatient Hospital Stay: Payer: Medicare Other | Attending: Internal Medicine

## 2023-07-01 DIAGNOSIS — D500 Iron deficiency anemia secondary to blood loss (chronic): Secondary | ICD-10-CM | POA: Diagnosis present

## 2023-07-01 LAB — CBC WITH DIFFERENTIAL (CANCER CENTER ONLY)
Abs Immature Granulocytes: 0.02 10*3/uL (ref 0.00–0.07)
Basophils Absolute: 0 10*3/uL (ref 0.0–0.1)
Basophils Relative: 1 %
Eosinophils Absolute: 0.2 10*3/uL (ref 0.0–0.5)
Eosinophils Relative: 3 %
HCT: 41.8 % (ref 39.0–52.0)
Hemoglobin: 14.5 g/dL (ref 13.0–17.0)
Immature Granulocytes: 0 %
Lymphocytes Relative: 30 %
Lymphs Abs: 2.2 10*3/uL (ref 0.7–4.0)
MCH: 30.1 pg (ref 26.0–34.0)
MCHC: 34.7 g/dL (ref 30.0–36.0)
MCV: 86.9 fL (ref 80.0–100.0)
Monocytes Absolute: 0.6 10*3/uL (ref 0.1–1.0)
Monocytes Relative: 8 %
Neutro Abs: 4.3 10*3/uL (ref 1.7–7.7)
Neutrophils Relative %: 58 %
Platelet Count: 215 10*3/uL (ref 150–400)
RBC: 4.81 MIL/uL (ref 4.22–5.81)
RDW: 13.6 % (ref 11.5–15.5)
WBC Count: 7.3 10*3/uL (ref 4.0–10.5)
nRBC: 0 % (ref 0.0–0.2)

## 2023-07-01 LAB — FERRITIN: Ferritin: 95 ng/mL (ref 24–336)

## 2023-07-09 ENCOUNTER — Other Ambulatory Visit: Payer: Self-pay

## 2023-07-16 ENCOUNTER — Other Ambulatory Visit: Payer: Self-pay | Admitting: Family

## 2023-07-16 DIAGNOSIS — F17200 Nicotine dependence, unspecified, uncomplicated: Secondary | ICD-10-CM

## 2023-07-16 DIAGNOSIS — Z122 Encounter for screening for malignant neoplasm of respiratory organs: Secondary | ICD-10-CM

## 2023-07-29 ENCOUNTER — Inpatient Hospital Stay: Payer: Medicare Other | Attending: Internal Medicine

## 2023-07-31 ENCOUNTER — Ambulatory Visit
Admission: RE | Admit: 2023-07-31 | Discharge: 2023-07-31 | Disposition: A | Discharge status: Home / Self Care | Source: Ambulatory Visit | Attending: Family | Admitting: Family

## 2023-07-31 DIAGNOSIS — Z122 Encounter for screening for malignant neoplasm of respiratory organs: Secondary | ICD-10-CM

## 2023-07-31 DIAGNOSIS — F17200 Nicotine dependence, unspecified, uncomplicated: Secondary | ICD-10-CM

## 2023-08-15 ENCOUNTER — Ambulatory Visit: Payer: Medicare Other | Admitting: Podiatry

## 2023-08-15 ENCOUNTER — Encounter: Payer: Self-pay | Admitting: Podiatry

## 2023-08-15 DIAGNOSIS — M79674 Pain in right toe(s): Secondary | ICD-10-CM | POA: Diagnosis not present

## 2023-08-15 DIAGNOSIS — B351 Tinea unguium: Secondary | ICD-10-CM

## 2023-08-15 DIAGNOSIS — E1142 Type 2 diabetes mellitus with diabetic polyneuropathy: Secondary | ICD-10-CM | POA: Diagnosis not present

## 2023-08-15 DIAGNOSIS — M79675 Pain in left toe(s): Secondary | ICD-10-CM | POA: Diagnosis not present

## 2023-08-15 NOTE — Progress Notes (Signed)
 This patient returns to my office for at risk foot care.  This patient requires this care by a professional since this patient will be at risk due to having  diabetes.  This patient is unable to cut nails himself since the patient cannot reach his nails.These nails are painful walking and wearing shoes.  This patient presents for at risk foot care today.  General Appearance  Alert, conversant and in no acute stress.  Vascular  Dorsalis pedis and posterior tibial  pulses are palpable  bilaterally.  Capillary return is within normal limits  bilaterally. Temperature is within normal limits  bilaterally.  Neurologic  Senn-Weinstein monofilament wire test within normal limits  bilaterally. Muscle power within normal limits bilaterally.  Nails Thick disfigured discolored nails with subungual debris  from hallux to fifth toes bilaterally. No evidence of bacterial infection or drainage bilaterally.  Orthopedic  No limitations of motion  feet .  No crepitus or effusions noted.  No bony pathology or digital deformities noted.  Skin  normotropic skin with no porokeratosis noted bilaterally.  No signs of infections or ulcers noted.     Onychomycosis  Pain in right toes  Pain in left toes  Consent was obtained for treatment procedures.   Mechanical debridement of nails 1-5  bilaterally performed with a nail nipper.  Filed with dremel without incident.    Return office visit    4  months                  Told patient to return for periodic foot care and evaluation due to potential at risk complications.   Helane Gunther DPM

## 2023-08-26 ENCOUNTER — Inpatient Hospital Stay: Payer: Medicare Other | Attending: Internal Medicine

## 2023-08-26 DIAGNOSIS — D500 Iron deficiency anemia secondary to blood loss (chronic): Secondary | ICD-10-CM | POA: Insufficient documentation

## 2023-08-26 LAB — CBC WITH DIFFERENTIAL (CANCER CENTER ONLY)
Abs Immature Granulocytes: 0.03 10*3/uL (ref 0.00–0.07)
Basophils Absolute: 0 10*3/uL (ref 0.0–0.1)
Basophils Relative: 1 %
Eosinophils Absolute: 0.2 10*3/uL (ref 0.0–0.5)
Eosinophils Relative: 3 %
HCT: 45 % (ref 39.0–52.0)
Hemoglobin: 15.3 g/dL (ref 13.0–17.0)
Immature Granulocytes: 1 %
Lymphocytes Relative: 28 %
Lymphs Abs: 1.8 10*3/uL (ref 0.7–4.0)
MCH: 29.6 pg (ref 26.0–34.0)
MCHC: 34 g/dL (ref 30.0–36.0)
MCV: 87 fL (ref 80.0–100.0)
Monocytes Absolute: 0.6 10*3/uL (ref 0.1–1.0)
Monocytes Relative: 10 %
Neutro Abs: 3.7 10*3/uL (ref 1.7–7.7)
Neutrophils Relative %: 57 %
Platelet Count: 173 10*3/uL (ref 150–400)
RBC: 5.17 MIL/uL (ref 4.22–5.81)
RDW: 13.4 % (ref 11.5–15.5)
WBC Count: 6.4 10*3/uL (ref 4.0–10.5)
nRBC: 0 % (ref 0.0–0.2)

## 2023-08-26 LAB — FERRITIN: Ferritin: 61 ng/mL (ref 24–336)

## 2023-08-28 ENCOUNTER — Other Ambulatory Visit: Payer: Self-pay

## 2023-08-28 ENCOUNTER — Telehealth: Payer: Self-pay

## 2023-08-28 NOTE — Telephone Encounter (Signed)
 Received telephone call from the patient requesting his recent labs be faxed to his cardiologist: Dr. Chapman Commodore F#: [phone]. Faxed labs to Dr. Chapman Commodore, confirmation received.

## 2023-09-03 IMAGING — DX DG CHEST 2V
3 series · 3 of 3 positions shown · non-contrast
Comparison: Chest x-ray 05/05/2020.

CLINICAL DATA: Shortness of breath, dyspnea.

EXAM:
CHEST - 2 VIEW

[chest pa]
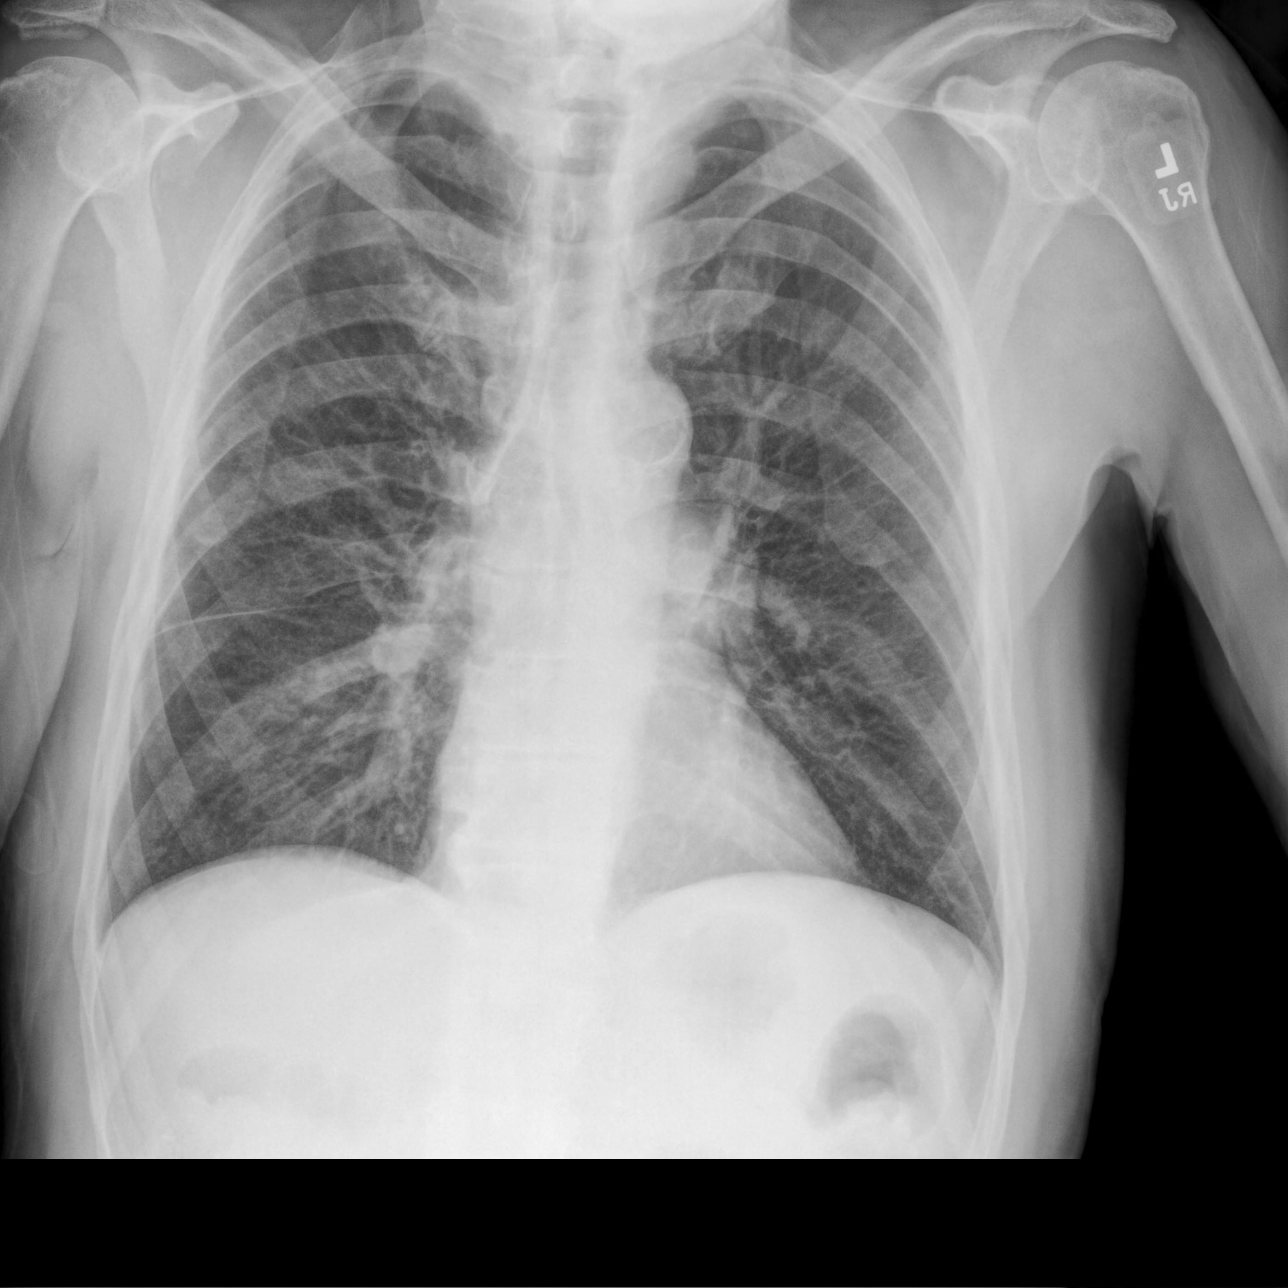

[chest lat (1 of 2)]
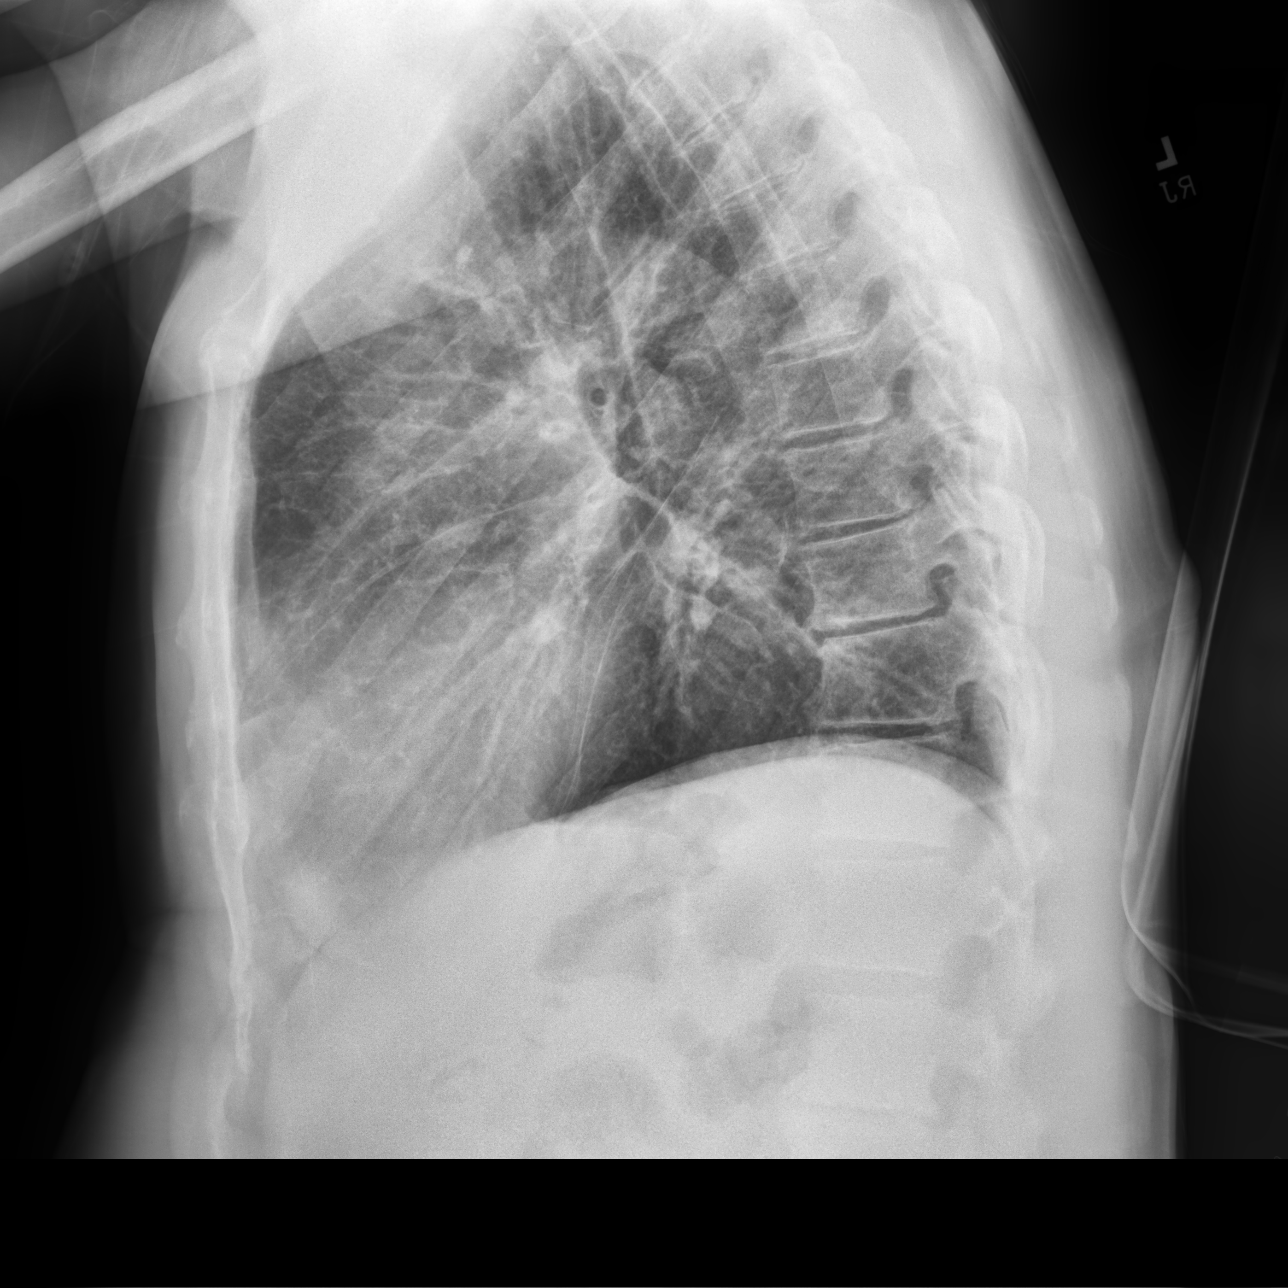

[chest lat (2 of 2)]
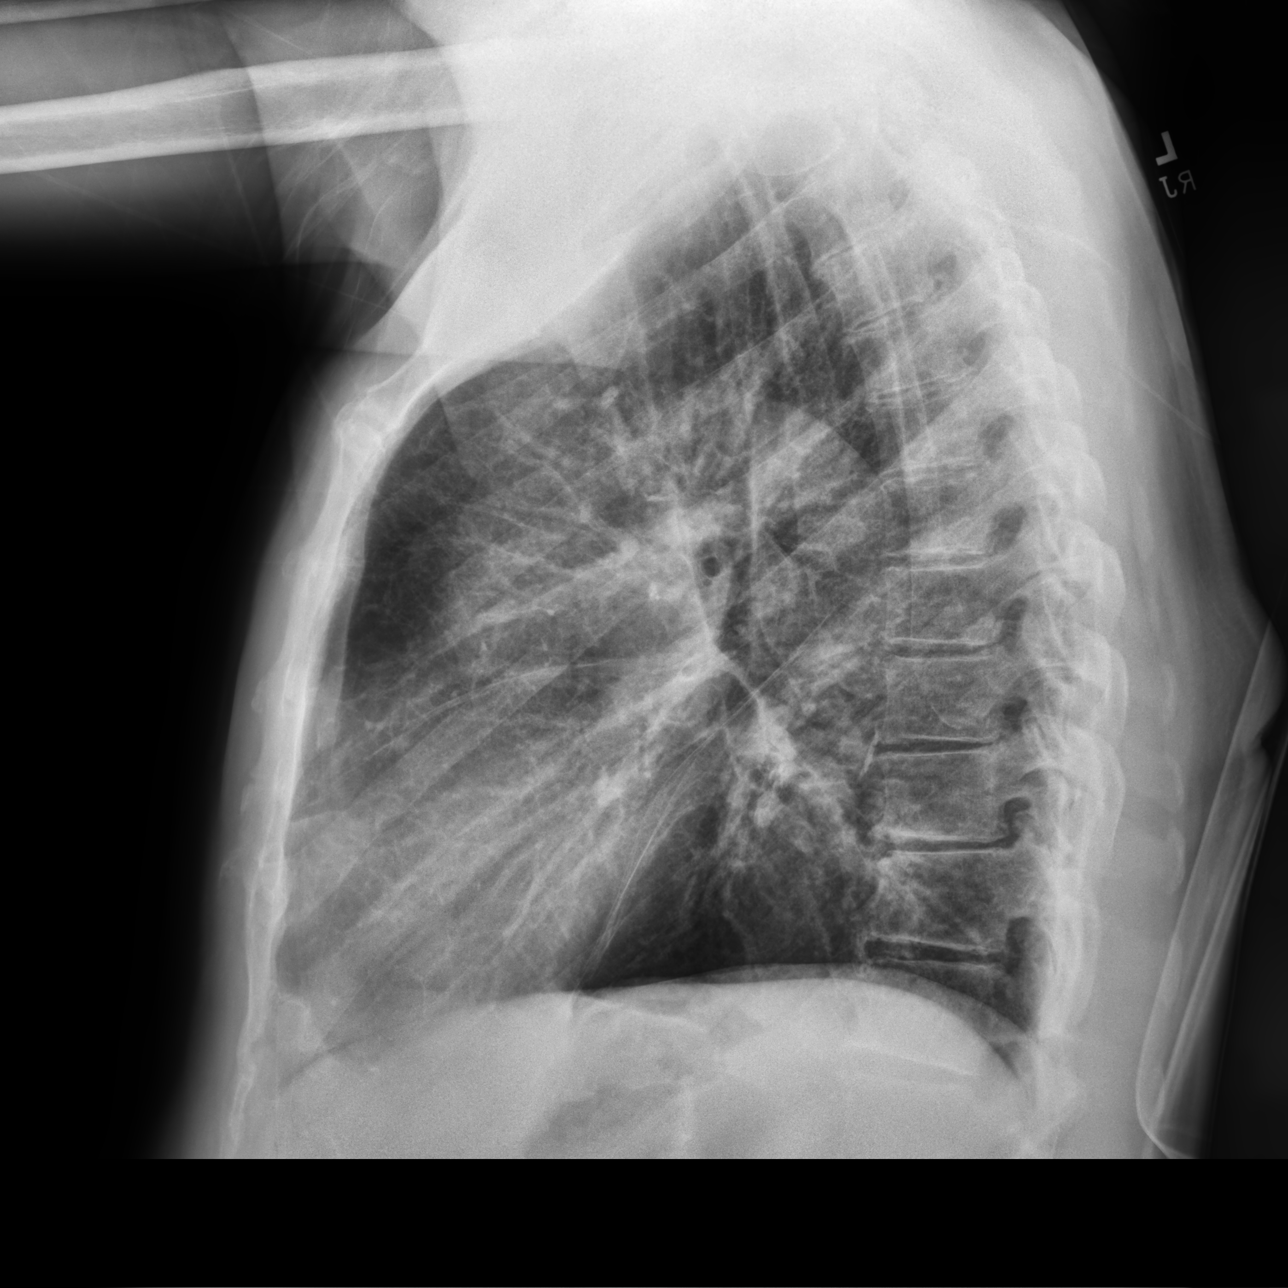

[3 of 3 positions shown; findings below may reference images not displayed]

FINDINGS: The heart size and mediastinal contours are within normal limits.
Both lungs are clear. The visualized skeletal structures are
unremarkable.
IMPRESSION: No active cardiopulmonary disease.

## 2023-09-04 IMAGING — DX DG CHEST 1V PORT
1 series · 1 of 1 positions shown · non-contrast
Comparison: August 02, 2021.

CLINICAL DATA: Shortness of breath.

EXAM:
PORTABLE CHEST 1 VIEW

[chest ap]
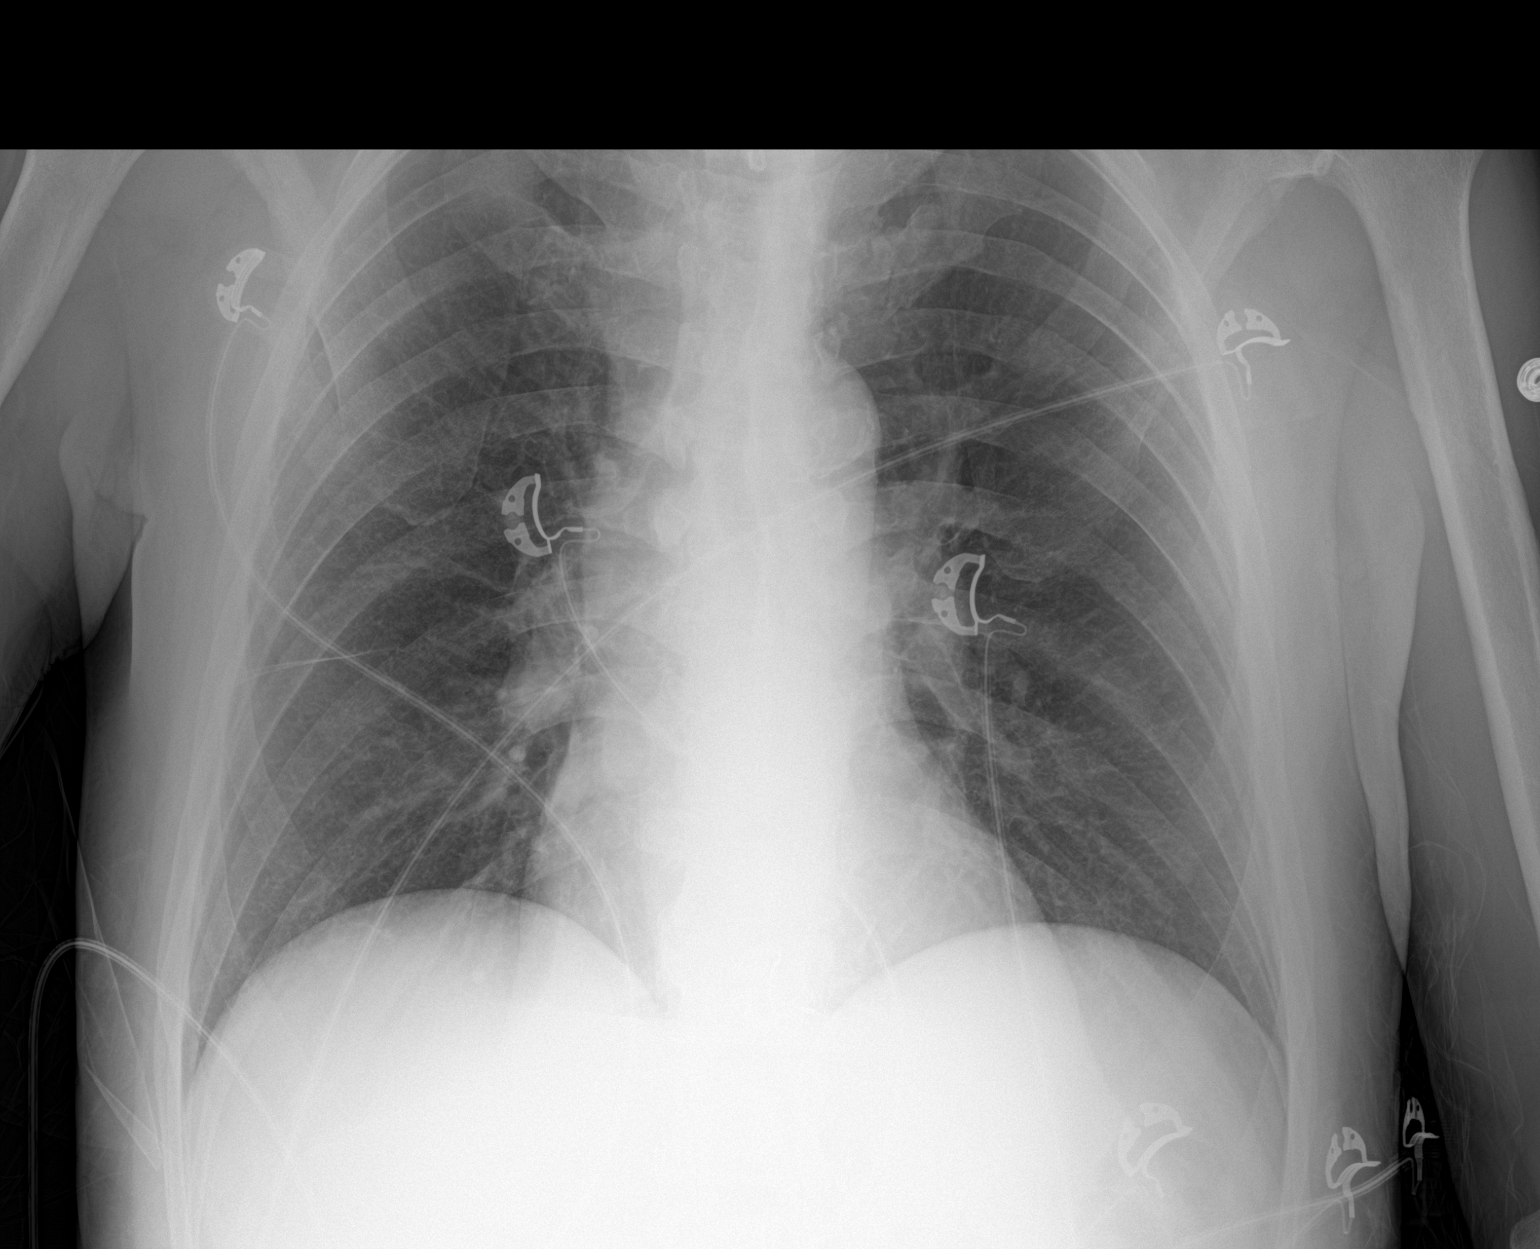

[1 of 1 positions shown; findings below may reference images not displayed]

FINDINGS: The heart size and mediastinal contours are within normal limits.
Both lungs are clear. The visualized skeletal structures are
unremarkable.
IMPRESSION: No active disease.

## 2023-09-23 ENCOUNTER — Inpatient Hospital Stay: Payer: Medicare Other | Attending: Internal Medicine

## 2023-10-20 ENCOUNTER — Other Ambulatory Visit: Payer: Self-pay | Admitting: Nurse Practitioner

## 2023-10-20 DIAGNOSIS — D500 Iron deficiency anemia secondary to blood loss (chronic): Secondary | ICD-10-CM

## 2023-10-20 NOTE — Progress Notes (Unsigned)
 Patient Care Team: Claudene Prentice DELENA Mickey., FNP as PCP - General (Family Medicine) Elicia Claw, MD as Consulting Physician (Gastroenterology) Lanny Callander, MD as Consulting Physician (Hematology) Neda Jennet DELENA, MD as Consulting Physician (Pulmonary Disease) Gretta Lonni PARAS, MD as Consulting Physician (Vascular Surgery)  Clinic Day:  10/21/2023  Referring physician: Claudene Prentice DELENA Mickey., FNP  ASSESSMENT & PLAN:   Assessment & Plan: Iron  deficiency anemia due to chronic blood loss -he has h/o anemia since at least 02/2019, when he was hospitalized and received IV Feraheme . His Hg has been in 6-9 g/dl range in the past year and again required blood transfusion in 07/2021 -most recent colonoscopy 02/2021, EGD 08/05/21, and capsule endoscopy 08/13/21. 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 -he received iv Venofer  400mg  as needed and responded well  - 10/21/2023 -CBC showing normal Hgb and HCT.  Normal iron  panel with ferritin at 84. --Will plan to recheck labs in 3 months and again in 6 months with a follow-up.  If labs remain stable and within normal limits, will increase the time between lab checks to 6 months with follow-up in 1 year.   Type 2 diabetes  States that blood sugars have recently been very elevated. Recently started on insulin . Feels like sugars are gradually improving. He is followed by his primary care provider for this.   Plan Reviewed labs -CBC within normal limits  -iron  68, TIBC 347, sat ratio 20, UIBC 279. -ferritin 84. IV iron  therapy not anticipated, however, advised him that if needed, he would be contacted for IV iron  by [employer]. Patient voiced understanding and agreement.  Plan to recheck labs in 3 months Labs and follow up in 6 months.   The patient understands the plans discussed today and is in agreement with them.  He knows to contact our office if he develops concerns prior to his next appointment.  I  provided 20 minutes of face-to-face time during this encounter and > 50% was spent counseling as documented under my assessment and plan.    Powell FORBES Lessen, NP  [facility] - [facility] [address] [place] [address] Dept: [phone] Dept Fax: [phone]   Orders Placed This Encounter  Procedures   Iron  and Iron  Binding Capacity ([facility] only)    Standing Status:   Standing    Number of Occurrences:   4    Expiration Date:   10/20/2024      CHIEF COMPLAINT:  CC: iron  deficiency anemia due to chronic blood loss  Current Treatment:  IV iron  as needed   INTERVAL HISTORY:  Dyllon is here today for repeat clinical assessment. He was last seen by myself on 05/09/2023.  Most recent labs done 08/26/2023.  Hgb and HCT both normal.  Ferritin also normal at 61.  Today, his CBC is normal with Hgb 13.6 and Hct 39.4. ferritin and iron  studies are pending. He has not required IV iron  since May 2024.  He states that he is no longer taking oral iron . Has not been taking this in several months. He is on Eliquis  and Plavix  daily.  He tells me that his blood sugars have been elevated. His primary care recently started him on insulin  which seems to be helping with control. He denies chest pain, chest pressure, or shortness of breath. He denies headaches or visual disturbances. He denies abdominal pain, nausea, vomiting,  or changes in bowel or bladder habits. He denies unusual bleeding such as blood in stool, hematemesis, hemoptysis, or epistaxis. He denies fevers or chills. He denies pain. His appetite is good. His weight has decreased 12 pounds over last 6 months.  I have reviewed the past medical history, past surgical history, social history and family history with the patient and they are unchanged from previous note.  ALLERGIES:  is allergic to latex.  MEDICATIONS:  Current Outpatient Medications  Medication Sig  Dispense Refill   amLODipine (NORVASC) 5 MG tablet Take 5 mg by mouth daily.     atorvastatin  (LIPITOR) 40 MG tablet Take 1 tablet (40 mg total) by mouth daily at 6 PM. (Patient taking differently: Take 40 mg by mouth daily.) 30 tablet 0   clopidogrel  (PLAVIX ) 75 MG tablet Take 1 tablet by mouth daily.     clotrimazole -betamethasone  (LOTRISONE ) cream Apply 1 Application topically 2 (two) times daily. 30 g 2   ELIQUIS  5 MG TABS tablet TAKE 1 TABLET BY MOUTH TWICE A DAY 60 tablet 3   famotidine  (PEPCID ) 20 MG tablet Take 1 tablet (20 mg total) by mouth 2 (two) times daily as needed for heartburn or indigestion (itching).     ferrous sulfate  325 (65 FE) MG tablet Take 325 mg by mouth 2 (two) times daily with a meal.     glipiZIDE (GLUCOTROL) 10 MG tablet Take 10 mg by mouth 2 (two) times daily.     hydrOXYzine (ATARAX) 25 MG tablet Take 25 mg by mouth 3 (three) times daily as needed for itching or anxiety.     latanoprost  (XALATAN ) 0.005 % ophthalmic solution Place 1 drop into both eyes at bedtime.     lisinopril (ZESTRIL) 40 MG tablet Take 40 mg by mouth daily.     metFORMIN  (GLUCOPHAGE ) 500 MG tablet Take 1 tablet by mouth 2 (two) times daily.     methocarbamol (ROBAXIN) 500 MG tablet TAKE 1 2 TABLETS (1,000 MG) BY ORAL ROUTE 4 TIMES PER DAY AS NEEDED FOR MUSCLE SPASM FOR 30 DAYS     metoprolol  succinate (TOPROL -XL) 100 MG 24 hr tablet Take 100 mg by mouth daily.     metroNIDAZOLE (FLAGYL) 500 MG tablet TAKE 1 TABLET (500 MG) BY ORAL ROUTE 3 TIMES PER DAY FOR 14 DAYS FOR H PYLORI INFECTION     pantoprazole  (PROTONIX ) 40 MG tablet Take 40 mg by mouth 2 (two) times daily.     sucralfate (CARAFATE) 1 g tablet TAKE 1 TABLET (1 GRAM) BY ORAL ROUTE 2 TIMES PER DAY ON AN EMPTY STOMACH FOR ULCER FOR 90 DAYS     Tiotropium Bromide-Olodaterol (STIOLTO RESPIMAT ) 2.5-2.5 MCG/ACT AERS Inhale 2 puffs into the lungs daily. 4 g 0   Tiotropium Bromide-Olodaterol (STIOLTO RESPIMAT ) 2.5-2.5 MCG/ACT AERS Inhale 2  puffs into the lungs daily. 4 g 6   traMADol (ULTRAM) 50 MG tablet TAKE 1 TABLET (50 MG) BY ORAL ROUTE EVERY 6 HOURS AS NEEDED FOR LOW BACK PAIN FOR 5 DAYS     triamcinolone  cream (KENALOG ) 0.1 % Apply 1 application. topically daily as needed (itching).     No current facility-administered medications for this visit.     REVIEW OF SYSTEMS:   Constitutional: Denies fevers, chills or abnormal weight loss Eyes: Denies blurriness of vision Ears, nose, mouth, throat, and face: Denies mucositis or sore throat Respiratory: Denies cough, dyspnea or wheezes Cardiovascular: Denies palpitation, chest discomfort or lower extremity swelling Gastrointestinal:  Denies nausea, heartburn or change in  bowel habits Skin: Denies abnormal skin rashes Lymphatics: Denies new lymphadenopathy or easy bruising Neurological:Denies numbness, tingling or new weaknesses Behavioral/Psych: Mood is stable, no new changes  All other systems were reviewed with the patient and are negative.   VITALS:   Today's Vitals   10/21/23 0928 10/21/23 0947  BP: 120/64   Pulse: 65   Resp: 17   Temp: 98.2 F (36.8 C)   SpO2: 98%   Weight: 173 lb 3.2 oz (78.6 kg)   PainSc:  0-No pain   Body mass index is 21.65 kg/m.    Wt Readings from Last 3 Encounters:  10/21/23 173 lb 3.2 oz (78.6 kg)  05/09/23 185 lb 11.2 oz (84.2 kg)  04/17/23 182 lb 1.6 oz (82.6 kg)    Body mass index is 21.65 kg/m.  Performance status (ECOG): 0 - Asymptomatic  PHYSICAL EXAM:   GENERAL:alert, no distress and comfortable SKIN: skin color, texture, turgor are normal, no rashes or significant lesions EYES: normal, Conjunctiva are pink and non-injected, sclera clear OROPHARYNX:no exudate, no erythema and lips, buccal mucosa, and tongue normal  NECK: supple, thyroid normal size, non-tender, without nodularity LYMPH:  no palpable lymphadenopathy in the cervical, axillary or inguinal LUNGS: clear to auscultation and percussion with normal  breathing effort HEART: regular rate & rhythm and no murmurs and no lower extremity edema ABDOMEN:abdomen soft, non-tender and normal bowel sounds Musculoskeletal:no cyanosis of digits and no clubbing  NEURO: alert & oriented x 3 with fluent speech, no focal motor/sensory deficits  LABORATORY DATA:  I have reviewed the data as listed    Component Value Date/Time   NA 133 (L) 08/16/2022 1839   K 3.9 08/16/2022 1839   CL 103 08/16/2022 1839   CO2 20 (L) 08/16/2022 1839   GLUCOSE 117 (H) 08/16/2022 1839   BUN 28 (H) 08/16/2022 1839   CREATININE 2.12 (H) 08/16/2022 1839   CALCIUM  9.2 08/16/2022 1839   PROT 7.5 08/16/2022 1839   ALBUMIN 4.3 08/16/2022 1839   AST 17 08/16/2022 1839   ALT 13 08/16/2022 1839   ALKPHOS 65 08/16/2022 1839   BILITOT 0.7 08/16/2022 1839   GFRNONAA 33 (L) 08/16/2022 1839   GFRAA >60 02/25/2019 1059     Lab Results  Component Value Date   WBC 6.0 10/21/2023   NEUTROABS 3.2 10/21/2023   HGB 13.6 10/21/2023   HCT 39.4 10/21/2023   MCV 86.2 10/21/2023   PLT 158 10/21/2023   Iron /TIBC/Ferritin/ %Sat    Component Value Date/Time   IRON  68 10/21/2023 0914   TIBC 347 10/21/2023 0914   FERRITIN 84 10/21/2023 0913   IRONPCTSAT 20 10/21/2023 0914

## 2023-10-20 NOTE — Assessment & Plan Note (Signed)
-  he has h/o anemia since at least 02/2019, when he was hospitalized and received IV Feraheme . His Hg has been in 6-9 g/dl range in the past year and again required blood transfusion in 07/2021 -most recent colonoscopy 02/2021, EGD 08/05/21, and capsule endoscopy 08/13/21. 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 -he received iv Venofer  400mg  as needed and responded well  - His recent labs done in May 2025 with normal Hgb, HCT, and ferritin.

## 2023-10-21 ENCOUNTER — Encounter: Payer: Self-pay | Admitting: Nurse Practitioner

## 2023-10-21 ENCOUNTER — Inpatient Hospital Stay (HOSPITAL_BASED_OUTPATIENT_CLINIC_OR_DEPARTMENT_OTHER): Payer: Medicare Other | Admitting: Nurse Practitioner

## 2023-10-21 ENCOUNTER — Inpatient Hospital Stay: Payer: Medicare Other | Attending: Internal Medicine

## 2023-10-21 VITALS — BP 120/64 | HR 65 | Temp 98.2°F | Resp 17 | Wt 173.2 lb

## 2023-10-21 DIAGNOSIS — D500 Iron deficiency anemia secondary to blood loss (chronic): Secondary | ICD-10-CM | POA: Diagnosis not present

## 2023-10-21 DIAGNOSIS — Z7901 Long term (current) use of anticoagulants: Secondary | ICD-10-CM | POA: Insufficient documentation

## 2023-10-21 LAB — CBC WITH DIFFERENTIAL (CANCER CENTER ONLY)
Abs Immature Granulocytes: 0.02 K/uL (ref 0.00–0.07)
Basophils Absolute: 0 K/uL (ref 0.0–0.1)
Basophils Relative: 1 %
Eosinophils Absolute: 0.2 K/uL (ref 0.0–0.5)
Eosinophils Relative: 3 %
HCT: 39.4 % (ref 39.0–52.0)
Hemoglobin: 13.6 g/dL (ref 13.0–17.0)
Immature Granulocytes: 0 %
Lymphocytes Relative: 32 %
Lymphs Abs: 2 K/uL (ref 0.7–4.0)
MCH: 29.8 pg (ref 26.0–34.0)
MCHC: 34.5 g/dL (ref 30.0–36.0)
MCV: 86.2 fL (ref 80.0–100.0)
Monocytes Absolute: 0.6 K/uL (ref 0.1–1.0)
Monocytes Relative: 11 %
Neutro Abs: 3.2 K/uL (ref 1.7–7.7)
Neutrophils Relative %: 53 %
Platelet Count: 158 K/uL (ref 150–400)
RBC: 4.57 MIL/uL (ref 4.22–5.81)
RDW: 13.3 % (ref 11.5–15.5)
WBC Count: 6 K/uL (ref 4.0–10.5)
nRBC: 0 % (ref 0.0–0.2)

## 2023-10-21 LAB — IRON AND IRON BINDING CAPACITY (CC-WL,HP ONLY)
Iron: 68 ug/dL (ref 45–182)
Saturation Ratios: 20 % (ref 17.9–39.5)
TIBC: 347 ug/dL (ref 250–450)
UIBC: 279 ug/dL (ref 117–376)

## 2023-10-21 LAB — FERRITIN: Ferritin: 84 ng/mL (ref 24–336)

## 2023-10-29 ENCOUNTER — Telehealth: Payer: Self-pay

## 2023-10-29 ENCOUNTER — Other Ambulatory Visit: Payer: Self-pay

## 2023-10-29 NOTE — Telephone Encounter (Signed)
 Pt called wanting to know the results of his last labs.  Informed pt that his hemoglobin, iron , and ferritin were all in normal range.  Stated pt's anemia has resolved and does not need IV Iron  at this time.  Stated Dr. Lanny and her team will continue to monitor the pt's labs and will adjust his tx plan based on those results.  Pt verbalized understanding and had no further questions or concerns.

## 2023-12-19 ENCOUNTER — Encounter: Payer: Self-pay | Admitting: Podiatry

## 2023-12-19 ENCOUNTER — Ambulatory Visit: Admitting: Podiatry

## 2023-12-19 DIAGNOSIS — M79675 Pain in left toe(s): Secondary | ICD-10-CM

## 2023-12-19 DIAGNOSIS — B351 Tinea unguium: Secondary | ICD-10-CM | POA: Diagnosis not present

## 2023-12-19 DIAGNOSIS — E1142 Type 2 diabetes mellitus with diabetic polyneuropathy: Secondary | ICD-10-CM

## 2023-12-19 DIAGNOSIS — M79674 Pain in right toe(s): Secondary | ICD-10-CM | POA: Diagnosis not present

## 2023-12-19 NOTE — Progress Notes (Signed)
 This patient returns to my office for at risk foot care.  This patient requires this care by a professional since this patient will be at risk due to having  diabetes.  This patient is unable to cut nails himself since the patient cannot reach his nails.These nails are painful walking and wearing shoes.  This patient presents for at risk foot care today.  General Appearance  Alert, conversant and in no acute stress.  Vascular  Dorsalis pedis and posterior tibial  pulses are palpable  bilaterally.  Capillary return is within normal limits  bilaterally. Temperature is within normal limits  bilaterally.  Neurologic  Senn-Weinstein monofilament wire test within normal limits  bilaterally. Muscle power within normal limits bilaterally.  Nails Thick disfigured discolored nails with subungual debris  from hallux to fifth toes bilaterally. No evidence of bacterial infection or drainage bilaterally.  Orthopedic  No limitations of motion  feet .  No crepitus or effusions noted.  No bony pathology or digital deformities noted.  Skin  normotropic skin with no porokeratosis noted bilaterally.  No signs of infections or ulcers noted.     Onychomycosis  Pain in right toes  Pain in left toes  Consent was obtained for treatment procedures.   Mechanical debridement of nails 1-5  bilaterally performed with a nail nipper.  Filed with dremel without incident.    Return office visit    4  months                  Told patient to return for periodic foot care and evaluation due to potential at risk complications.   Helane Gunther DPM

## 2024-01-20 ENCOUNTER — Inpatient Hospital Stay: Attending: Internal Medicine

## 2024-01-20 DIAGNOSIS — D500 Iron deficiency anemia secondary to blood loss (chronic): Secondary | ICD-10-CM | POA: Insufficient documentation

## 2024-01-20 DIAGNOSIS — K922 Gastrointestinal hemorrhage, unspecified: Secondary | ICD-10-CM | POA: Diagnosis not present

## 2024-01-20 LAB — CBC WITH DIFFERENTIAL (CANCER CENTER ONLY)
Abs Immature Granulocytes: 0.02 K/uL (ref 0.00–0.07)
Basophils Absolute: 0 K/uL (ref 0.0–0.1)
Basophils Relative: 1 %
Eosinophils Absolute: 0.2 K/uL (ref 0.0–0.5)
Eosinophils Relative: 3 %
HCT: 43.8 % (ref 39.0–52.0)
Hemoglobin: 14.8 g/dL (ref 13.0–17.0)
Immature Granulocytes: 0 %
Lymphocytes Relative: 33 %
Lymphs Abs: 2.4 K/uL (ref 0.7–4.0)
MCH: 29.6 pg (ref 26.0–34.0)
MCHC: 33.8 g/dL (ref 30.0–36.0)
MCV: 87.6 fL (ref 80.0–100.0)
Monocytes Absolute: 0.7 K/uL (ref 0.1–1.0)
Monocytes Relative: 10 %
Neutro Abs: 3.7 K/uL (ref 1.7–7.7)
Neutrophils Relative %: 53 %
Platelet Count: 187 K/uL (ref 150–400)
RBC: 5 MIL/uL (ref 4.22–5.81)
RDW: 13.4 % (ref 11.5–15.5)
WBC Count: 7.1 K/uL (ref 4.0–10.5)
nRBC: 0 % (ref 0.0–0.2)

## 2024-01-20 LAB — FERRITIN: Ferritin: 46 ng/mL (ref 24–336)

## 2024-01-20 LAB — IRON AND IRON BINDING CAPACITY (CC-WL,HP ONLY)
Iron: 65 ug/dL (ref 45–182)
Saturation Ratios: 16 % — ABNORMAL LOW (ref 17.9–39.5)
TIBC: 396 ug/dL (ref 250–450)
UIBC: 331 ug/dL (ref 117–376)

## 2024-01-22 ENCOUNTER — Other Ambulatory Visit: Payer: Self-pay

## 2024-04-19 ENCOUNTER — Ambulatory Visit: Admitting: Podiatry

## 2024-04-25 NOTE — Progress Notes (Unsigned)
 "     [facility]   [phone] [phone]    Patient Care Team: Claudene Prentice DELENA Mickey., FNP as PCP - General (Family Medicine) Elicia Claw, MD as Consulting Physician (Gastroenterology) Lanny Callander, MD as Consulting Physician (Hematology) Neda Jennet DELENA, MD as Consulting Physician (Pulmonary Disease) Gretta Lonni PARAS, MD as Consulting Physician (Vascular Surgery)   CHIEF COMPLAINT: Follow up IDA  CURRENT THERAPY: IV iron  PRN, has not required since 2024  INTERVAL HISTORY Mr. Rodelo returns for follow up. Last seen by me colleague Powell Lessen, NP 10/21/23. Labs 01/20/24 were WNL.  He is not currently taking oral iron  supplement.  Denies any bleeding.  Denies symptoms of anemia.  ROS  All other systems reviewed and negative  Past Medical History:  Diagnosis Date   COPD (chronic obstructive pulmonary disease) (HCC)    Diabetes mellitus    Stroke (HCC)    Pt reports TIA in 2013   Transfusion history      Past Surgical History:  Procedure Laterality Date   ESOPHAGOGASTRODUODENOSCOPY (EGD) WITH PROPOFOL  N/A 08/05/2021   Procedure: ESOPHAGOGASTRODUODENOSCOPY (EGD) WITH PROPOFOL ;  Surgeon: Saintclair Jasper, MD;  Location: [facility];  Service: Gastroenterology;  Laterality: N/A;   GIVENS CAPSULE STUDY N/A 08/05/2021   Procedure: GIVENS CAPSULE STUDY;  Surgeon: Saintclair Jasper, MD;  Location: [facility];  Service: Gastroenterology;  Laterality: N/A;   HOT HEMOSTASIS N/A 08/05/2021   Procedure: HOT HEMOSTASIS (ARGON PLASMA COAGULATION/BICAP);  Surgeon: Saintclair Jasper, MD;  Location: [facility];  Service: Gastroenterology;  Laterality: N/A;   NO PAST SURGERIES       Outpatient Encounter Medications as of 04/27/2024  Medication Sig   amLODipine (NORVASC) 5 MG tablet Take 5 mg by mouth daily.   atorvastatin  (LIPITOR) 40 MG tablet Take 1 tablet (40 mg total) by mouth daily at 6 PM. (Patient taking differently: Take 40 mg by mouth daily.)   clopidogrel   (PLAVIX ) 75 MG tablet Take 1 tablet by mouth daily.   clotrimazole -betamethasone  (LOTRISONE ) cream Apply 1 Application topically 2 (two) times daily.   ELIQUIS  5 MG TABS tablet TAKE 1 TABLET BY MOUTH TWICE A DAY   famotidine  (PEPCID ) 20 MG tablet Take 1 tablet (20 mg total) by mouth 2 (two) times daily as needed for heartburn or indigestion (itching).   glipiZIDE (GLUCOTROL) 10 MG tablet Take 10 mg by mouth 2 (two) times daily.   hydrOXYzine (ATARAX) 25 MG tablet Take 25 mg by mouth 3 (three) times daily as needed for itching or anxiety.   latanoprost  (XALATAN ) 0.005 % ophthalmic solution Place 1 drop into both eyes at bedtime.   lisinopril (ZESTRIL) 40 MG tablet Take 40 mg by mouth daily.   metFORMIN  (GLUCOPHAGE ) 500 MG tablet Take 1 tablet by mouth 2 (two) times daily.   methocarbamol (ROBAXIN) 500 MG tablet TAKE 1 2 TABLETS (1,000 MG) BY ORAL ROUTE 4 TIMES PER DAY AS NEEDED FOR MUSCLE SPASM FOR 30 DAYS   metoprolol  succinate (TOPROL -XL) 100 MG 24 hr tablet Take 100 mg by mouth daily.   metroNIDAZOLE (FLAGYL) 500 MG tablet TAKE 1 TABLET (500 MG) BY ORAL ROUTE 3 TIMES PER DAY FOR 14 DAYS FOR H PYLORI INFECTION   pantoprazole  (PROTONIX ) 40 MG tablet Take 40 mg by mouth 2 (two) times daily.   sucralfate (CARAFATE) 1 g tablet TAKE 1 TABLET (1 GRAM) BY ORAL ROUTE 2 TIMES PER DAY ON AN EMPTY STOMACH FOR ULCER FOR 90 DAYS   Tiotropium Bromide-Olodaterol (STIOLTO RESPIMAT ) 2.5-2.5  MCG/ACT AERS Inhale 2 puffs into the lungs daily.   Tiotropium Bromide-Olodaterol (STIOLTO RESPIMAT ) 2.5-2.5 MCG/ACT AERS Inhale 2 puffs into the lungs daily.   traMADol (ULTRAM) 50 MG tablet TAKE 1 TABLET (50 MG) BY ORAL ROUTE EVERY 6 HOURS AS NEEDED FOR LOW BACK PAIN FOR 5 DAYS   triamcinolone  cream (KENALOG ) 0.1 % Apply 1 application. topically daily as needed (itching).   [DISCONTINUED] ferrous sulfate  325 (65 FE) MG tablet Take 325 mg by mouth 2 (two) times daily with a meal.   No facility-administered encounter  medications on file as of 04/27/2024.     Today's Vitals   04/27/24 0935 04/27/24 0943  BP: 138/64   Pulse: 78   Resp: 17   Temp: (!) 97.5 F (36.4 C)   SpO2: 99%   Weight: 183 lb 8 oz (83.2 kg)   PainSc:  0-No pain   Body mass index is 22.94 kg/m.    PHYSICAL EXAM GENERAL:alert, no distress and comfortable SKIN: no rash  EYES: sclera clear LUNGS: clear with normal breathing effort HEART: regular rate & rhythm ABDOMEN: abdomen soft, non-tender  NEURO: alert & oriented x 3 with fluent speech, no focal motor/sensory deficits   CBC    Latest Ref Rng & Units 04/27/2024    9:21 AM 01/20/2024    9:53 AM 10/21/2023    9:14 AM  CBC  WBC 4.0 - 10.5 K/uL 5.7  7.1  6.0   Hemoglobin 13.0 - 17.0 g/dL 86.0  85.1  86.3   Hematocrit 39.0 - 52.0 % 40.8  43.8  39.4   Platelets 150 - 400 K/uL 173  187  158       CMP     Latest Ref Rng & Units 08/16/2022    6:39 PM 08/06/2021    3:23 AM 08/05/2021    8:24 AM  CMP  Glucose 70 - 99 mg/dL 882  742  803   BUN 8 - 23 mg/dL 28  10  9    Creatinine 0.61 - 1.24 mg/dL 7.87  8.81  9.02   Sodium 135 - 145 mmol/L 133  137  139   Potassium 3.5 - 5.1 mmol/L 3.9  3.9  3.7   Chloride 98 - 111 mmol/L 103  109  112   CO2 22 - 32 mmol/L 20  22  22    Calcium  8.9 - 10.3 mg/dL 9.2  8.6  8.8   Total Protein 6.5 - 8.1 g/dL 7.5     Total Bilirubin 0.3 - 1.2 mg/dL 0.7     Alkaline Phos 38 - 126 U/L 65     AST 15 - 41 U/L 17     ALT 0 - 44 U/L 13         ASSESSMENT & PLAN:   Iron  deficiency anemia due to chronic blood loss -He has h/o anemia since at least 02/2019, when he was hospitalized and received IV Feraheme . His Hg has been in 6-9 g/dl range and required blood transfusion in 07/2021 -most recent colonoscopy 02/2021, EGD 08/05/21, and capsule endoscopy 08/13/21. 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 -he received iv Venofer  400mg  as needed and responded well; last given 02/21/2023, IDA subsequently resolved  -Mr. Grandstaff  appears well, no evidence of bleeding or symptoms of IDA. Labs WNL. He does not need to restart iron  therapy     PLAN: -Labs reviewed, IDA resolved  -F/up with PCP with CBC and iron  studies q6 months -We will see him  back as needed in the future, with recurrent IDA or other hematological concerns     All questions were answered. The patient knows to call the clinic with any problems, questions or concerns. No barriers to learning were detected.   Alexandros Ewan K Lilyona Richner, NP 04/27/2024  "

## 2024-04-26 ENCOUNTER — Ambulatory Visit: Admitting: Podiatry

## 2024-04-27 ENCOUNTER — Encounter: Payer: Self-pay | Admitting: Physician Assistant

## 2024-04-27 ENCOUNTER — Inpatient Hospital Stay: Attending: Internal Medicine

## 2024-04-27 ENCOUNTER — Encounter: Payer: Self-pay | Admitting: Nurse Practitioner

## 2024-04-27 ENCOUNTER — Telehealth: Payer: Self-pay

## 2024-04-27 ENCOUNTER — Inpatient Hospital Stay: Admitting: Nurse Practitioner

## 2024-04-27 VITALS — BP 138/64 | HR 78 | Temp 97.5°F | Resp 17 | Wt 183.5 lb

## 2024-04-27 DIAGNOSIS — D500 Iron deficiency anemia secondary to blood loss (chronic): Secondary | ICD-10-CM

## 2024-04-27 LAB — CBC WITH DIFFERENTIAL (CANCER CENTER ONLY)
Abs Immature Granulocytes: 0.02 K/uL (ref 0.00–0.07)
Basophils Absolute: 0 K/uL (ref 0.0–0.1)
Basophils Relative: 1 %
Eosinophils Absolute: 0.2 K/uL (ref 0.0–0.5)
Eosinophils Relative: 3 %
HCT: 40.8 % (ref 39.0–52.0)
Hemoglobin: 13.9 g/dL (ref 13.0–17.0)
Immature Granulocytes: 0 %
Lymphocytes Relative: 32 %
Lymphs Abs: 1.8 K/uL (ref 0.7–4.0)
MCH: 29.5 pg (ref 26.0–34.0)
MCHC: 34.1 g/dL (ref 30.0–36.0)
MCV: 86.6 fL (ref 80.0–100.0)
Monocytes Absolute: 0.7 K/uL (ref 0.1–1.0)
Monocytes Relative: 11 %
Neutro Abs: 3 K/uL (ref 1.7–7.7)
Neutrophils Relative %: 53 %
Platelet Count: 173 K/uL (ref 150–400)
RBC: 4.71 MIL/uL (ref 4.22–5.81)
RDW: 13.7 % (ref 11.5–15.5)
WBC Count: 5.7 K/uL (ref 4.0–10.5)
nRBC: 0 % (ref 0.0–0.2)

## 2024-04-27 LAB — IRON AND IRON BINDING CAPACITY (CC-WL,HP ONLY)
Iron: 50 ug/dL (ref 45–182)
Saturation Ratios: 13 % — ABNORMAL LOW (ref 17.9–39.5)
TIBC: 384 ug/dL (ref 250–450)
UIBC: 334 ug/dL

## 2024-04-27 LAB — FERRITIN: Ferritin: 39 ng/mL (ref 24–336)

## 2024-04-27 NOTE — Telephone Encounter (Signed)
 Per, Lacie Burton Faxed Mayme Silversmith, NP office visit note to patient's PCP: [facility] [place]  DR. PRENTICE SHARPS  TELEPHONE# [phone] FAX# [phone]  Confirmation Received

## 2024-04-29 ENCOUNTER — Other Ambulatory Visit: Payer: Self-pay

## 2024-07-14 ENCOUNTER — Ambulatory Visit: Admitting: Podiatry
